# Patient Record
Sex: Female | Born: 1960 | Hispanic: No | Marital: Single | Smoking: Current every day smoker
Health system: Southern US, Community
[De-identification: ages and names within clinical notes are randomized; demographics above are authoritative.]

## PROBLEM LIST (undated history)

## (undated) DIAGNOSIS — F32A Depression, unspecified: Secondary | ICD-10-CM

## (undated) DIAGNOSIS — F329 Major depressive disorder, single episode, unspecified: Secondary | ICD-10-CM

## (undated) DIAGNOSIS — I10 Essential (primary) hypertension: Secondary | ICD-10-CM

## (undated) DIAGNOSIS — K219 Gastro-esophageal reflux disease without esophagitis: Secondary | ICD-10-CM

## (undated) DIAGNOSIS — Z8711 Personal history of peptic ulcer disease: Secondary | ICD-10-CM

## (undated) DIAGNOSIS — Z8669 Personal history of other diseases of the nervous system and sense organs: Secondary | ICD-10-CM

## (undated) DIAGNOSIS — F419 Anxiety disorder, unspecified: Secondary | ICD-10-CM

## (undated) DIAGNOSIS — J449 Chronic obstructive pulmonary disease, unspecified: Secondary | ICD-10-CM

## (undated) DIAGNOSIS — E0520 Thyrotoxicosis with toxic multinodular goiter without thyrotoxic crisis or storm: Secondary | ICD-10-CM

## (undated) DIAGNOSIS — E0590 Thyrotoxicosis, unspecified without thyrotoxic crisis or storm: Secondary | ICD-10-CM

## (undated) DIAGNOSIS — E66812 Obesity, class 2: Secondary | ICD-10-CM

## (undated) DIAGNOSIS — F1010 Alcohol abuse, uncomplicated: Secondary | ICD-10-CM

## (undated) DIAGNOSIS — F1020 Alcohol dependence, uncomplicated: Secondary | ICD-10-CM

## (undated) DIAGNOSIS — F1910 Other psychoactive substance abuse, uncomplicated: Secondary | ICD-10-CM

## (undated) DIAGNOSIS — I5189 Other ill-defined heart diseases: Secondary | ICD-10-CM

## (undated) DIAGNOSIS — Z87442 Personal history of urinary calculi: Secondary | ICD-10-CM

## (undated) DIAGNOSIS — R5383 Other fatigue: Secondary | ICD-10-CM

## (undated) DIAGNOSIS — Z8619 Personal history of other infectious and parasitic diseases: Secondary | ICD-10-CM

## (undated) HISTORY — DX: Depression, unspecified: F32.A

## (undated) HISTORY — DX: Other fatigue: R53.83

## (undated) HISTORY — DX: Essential (primary) hypertension: I10

## (undated) HISTORY — DX: Anxiety disorder, unspecified: F41.9

## (undated) HISTORY — DX: Personal history of other diseases of the nervous system and sense organs: Z86.69

## (undated) HISTORY — PX: URETERAL STENT PLACEMENT: SHX822

## (undated) HISTORY — DX: Alcohol dependence, uncomplicated: F10.20

## (undated) HISTORY — PX: KIDNEY STONE SURGERY: SHX686

## (undated) HISTORY — DX: Major depressive disorder, single episode, unspecified: F32.9

## (undated) HISTORY — PX: VAGINAL HYSTERECTOMY: SUR661

---

## 2004-08-17 ENCOUNTER — Emergency Department: Payer: Self-pay | Admitting: Emergency Medicine

## 2004-12-09 ENCOUNTER — Emergency Department: Payer: Self-pay | Admitting: Emergency Medicine

## 2004-12-15 ENCOUNTER — Observation Stay (HOSPITAL_COMMUNITY): Admission: RE | Admit: 2004-12-15 | Discharge: 2004-12-16 | Payer: Self-pay | Admitting: Gynecology

## 2004-12-25 ENCOUNTER — Emergency Department: Payer: Self-pay | Admitting: Emergency Medicine

## 2009-10-28 ENCOUNTER — Emergency Department: Payer: Self-pay | Admitting: Emergency Medicine

## 2014-06-24 ENCOUNTER — Encounter (INDEPENDENT_AMBULATORY_CARE_PROVIDER_SITE_OTHER): Payer: PRIVATE HEALTH INSURANCE | Admitting: Ophthalmology

## 2014-06-24 DIAGNOSIS — H43813 Vitreous degeneration, bilateral: Secondary | ICD-10-CM

## 2014-06-24 DIAGNOSIS — H4423 Degenerative myopia, bilateral: Secondary | ICD-10-CM

## 2015-01-18 ENCOUNTER — Ambulatory Visit (INDEPENDENT_AMBULATORY_CARE_PROVIDER_SITE_OTHER): Payer: PRIVATE HEALTH INSURANCE | Admitting: Family Medicine

## 2015-01-18 ENCOUNTER — Encounter: Payer: Self-pay | Admitting: Family Medicine

## 2015-01-18 VITALS — BP 120/74 | HR 75 | Resp 16 | Ht 64.0 in | Wt 162.8 lb

## 2015-01-18 DIAGNOSIS — R6889 Other general symptoms and signs: Secondary | ICD-10-CM | POA: Insufficient documentation

## 2015-01-18 DIAGNOSIS — F329 Major depressive disorder, single episode, unspecified: Secondary | ICD-10-CM

## 2015-01-18 DIAGNOSIS — R5383 Other fatigue: Secondary | ICD-10-CM | POA: Insufficient documentation

## 2015-01-18 DIAGNOSIS — N343 Urethral syndrome, unspecified: Secondary | ICD-10-CM | POA: Insufficient documentation

## 2015-01-18 DIAGNOSIS — F419 Anxiety disorder, unspecified: Secondary | ICD-10-CM | POA: Insufficient documentation

## 2015-01-18 DIAGNOSIS — Z1211 Encounter for screening for malignant neoplasm of colon: Secondary | ICD-10-CM | POA: Insufficient documentation

## 2015-01-18 DIAGNOSIS — R51 Headache: Secondary | ICD-10-CM

## 2015-01-18 DIAGNOSIS — R519 Headache, unspecified: Secondary | ICD-10-CM | POA: Insufficient documentation

## 2015-01-18 DIAGNOSIS — F32A Depression, unspecified: Secondary | ICD-10-CM

## 2015-01-18 DIAGNOSIS — Z0000 Encounter for general adult medical examination without abnormal findings: Secondary | ICD-10-CM | POA: Insufficient documentation

## 2015-01-18 MED ORDER — ESCITALOPRAM OXALATE 20 MG PO TABS: 20.0000 mg | ORAL_TABLET | Freq: Every day | ORAL | Status: DC

## 2015-01-18 NOTE — Progress Notes (Signed)
Name: Gina Wells   MRN: [id number]    DOB: [birth year]   Date:01/18/2015       Progress Note  Subjective  Chief Complaint  Chief Complaint  Patient presents with  . Annual Exam    HPI  Here for annual physical.  Has hx. Of depression with poor compliance with follow up.  On Lexapro, 10 mg/d.  It used to help,. But with more fatigue, sleeplessness, hot flashes, agitation, down/blue feelings. PHQ-9 score of 11.   Past Medical History  Diagnosis Date  . Hx of migraine headaches   . Alcoholism     Past Surgical History  Procedure Laterality Date  . Vaginal hysterectomy      Family History  Problem Relation Age of Onset  . Alcohol abuse Mother   . Diabetes Mother   . Alcohol abuse Father   . Diabetes Father   . Cancer Father   . Cancer Paternal Aunt     History   Social History  . Marital Status: Single    Spouse Name: N/A  . Number of Children: N/A  . Years of Education: N/A   Occupational History  . Not on file.   Social History Main Topics  . Smoking status: Current Every Day Smoker -- 0.50 packs/day    Types: Cigarettes  . Smokeless tobacco: Never Used  . Alcohol Use: No  . Drug Use: No  . Sexual Activity: Not on file   Other Topics Concern  . Not on file   Social History Narrative  . No narrative on file     Current outpatient prescriptions:  .  escitalopram (LEXAPRO) 10 MG tablet, Take by mouth., Disp: , Rfl:   No Known Allergies   Review of Systems  Constitutional: Negative.  Negative for fever, chills and weight loss.  HENT: Negative.  Negative for congestion and sore throat.   Eyes: Negative.  Negative for blurred vision and double vision.  Respiratory: Negative.  Negative for cough, sputum production, shortness of breath and wheezing.   Cardiovascular: Negative.  Negative for chest pain, palpitations, orthopnea and leg swelling.  Gastrointestinal: Negative.  Negative for heartburn, nausea, vomiting, abdominal pain, diarrhea and  blood in stool.  Genitourinary: Negative.  Negative for dysuria, frequency and hematuria.  Musculoskeletal: Negative.  Negative for myalgias and joint pain.  Skin: Negative.  Negative for rash.  Neurological: Negative.  Negative for dizziness, tremors, sensory change, focal weakness, weakness and headaches.  Endo/Heme/Allergies: Negative for polydipsia.  Psychiatric/Behavioral: Positive for depression. The patient is nervous/anxious.        PHQ-9 score of 11.      Objective  Filed Vitals:   01/18/15 1507  BP: 120/74  Pulse: 75  Resp: 16  Height:  (1.626 m)  Weight: 162 lb 12.8 oz (73.846 kg)    Physical Exam  Constitutional: She is oriented to person, place, and time and well-developed, well-nourished, and in no distress. No distress.  HENT:  Head: Normocephalic and atraumatic.  Right Ear: External ear normal.  Left Ear: External ear normal.  Nose: Nose normal.  Mouth/Throat: Oropharynx is clear and moist.  Eyes: Conjunctivae and EOM are normal. Pupils are equal, round, and reactive to light. No scleral icterus.  Neck: Normal range of motion. Neck supple. No tracheal deviation present. No thyromegaly present.  Cardiovascular: Normal rate, regular rhythm, normal heart sounds and intact distal pulses.  Exam reveals no gallop and no friction rub.   No murmur heard. Pulmonary/Chest: Effort normal and breath  sounds normal. No respiratory distress. She has no wheezes. She has no rales. She exhibits no tenderness. Right breast exhibits no inverted nipple, no mass, no nipple discharge, no skin change and no tenderness. Left breast exhibits no inverted nipple, no mass, no nipple discharge, no skin change and no tenderness. Breasts are symmetrical.  Abdominal: Soft. Bowel sounds are normal. She exhibits no distension and no mass. There is no tenderness.  Genitourinary: Right adnexa normal and left adnexa normal. No vaginal discharge found.  Normal external genitalia.  Cx. And uterus  absent.  No adnexal masses or tenderness.  Musculoskeletal: Normal range of motion. She exhibits no edema or tenderness.  Lymphadenopathy:    She has no cervical adenopathy.  Neurological: She is alert and oriented to person, place, and time. No cranial nerve deficit. Coordination normal.  Skin: Skin is warm and dry.  Psychiatric:  Affect somewhat flat and depressed.  PHQ-9 score of 11.  Vitals reviewed.        Assessment & Plan  Problem List Items Addressed This Visit      Other   Clinical depression   Relevant Medications   escitalopram (LEXAPRO) 10 MG tablet   Fatigue   Encounter for general adult medical examination without abnormal findings - Primary   Relevant Orders   MM Digital Screening      Meds ordered this encounter  Medications  . escitalopram (LEXAPRO) 10 MG tablet    Sig: Take by mouth.

## 2015-01-19 LAB — CBC WITH DIFFERENTIAL/PLATELET
BASOS: 0 %
Basophils Absolute: 0 10*3/uL (ref 0.0–0.2)
EOS (ABSOLUTE): 0.1 10*3/uL (ref 0.0–0.4)
Eos: 1 %
HEMOGLOBIN: 14.2 g/dL (ref 11.1–15.9)
Hematocrit: 41.8 % (ref 34.0–46.6)
IMMATURE GRANS (ABS): 0 10*3/uL (ref 0.0–0.1)
Immature Granulocytes: 0 %
Lymphocytes Absolute: 3.2 10*3/uL — ABNORMAL HIGH (ref 0.7–3.1)
Lymphs: 28 %
MCH: 28.9 pg (ref 26.6–33.0)
MCHC: 34 g/dL (ref 31.5–35.7)
MCV: 85 fL (ref 79–97)
MONOS ABS: 0.6 10*3/uL (ref 0.1–0.9)
Monocytes: 5 %
Neutrophils Absolute: 7.8 10*3/uL — ABNORMAL HIGH (ref 1.4–7.0)
Neutrophils: 66 %
Platelets: 262 10*3/uL (ref 150–379)
RBC: 4.92 x10E6/uL (ref 3.77–5.28)
RDW: 13.2 % (ref 12.3–15.4)
WBC: 11.8 10*3/uL — ABNORMAL HIGH (ref 3.4–10.8)

## 2015-01-20 ENCOUNTER — Other Ambulatory Visit: Payer: Self-pay

## 2015-01-20 DIAGNOSIS — E0590 Thyrotoxicosis, unspecified without thyrotoxic crisis or storm: Secondary | ICD-10-CM

## 2015-01-20 LAB — COMPREHENSIVE METABOLIC PANEL
A/G RATIO: 1.7 (ref 1.1–2.5)
ALK PHOS: 131 IU/L — AB (ref 39–117)
ALT: 16 IU/L (ref 0–32)
AST: 12 IU/L (ref 0–40)
Albumin: 4 g/dL (ref 3.5–5.5)
BUN/Creatinine Ratio: 23 (ref 9–23)
BUN: 17 mg/dL (ref 6–24)
Bilirubin Total: 0.5 mg/dL (ref 0.0–1.2)
CALCIUM: 9.8 mg/dL (ref 8.7–10.2)
CO2: 24 mmol/L (ref 18–29)
CREATININE: 0.75 mg/dL (ref 0.57–1.00)
Chloride: 106 mmol/L (ref 97–108)
GFR calc Af Amer: 105 mL/min/{1.73_m2} (ref >59)
GFR, EST NON AFRICAN AMERICAN: 91 mL/min/{1.73_m2} (ref >59)
GLUCOSE: 103 mg/dL — AB (ref 65–99)
Globulin, Total: 2.4 g/dL (ref 1.5–4.5)
POTASSIUM: 4.9 mmol/L (ref 3.5–5.2)
Sodium: 142 mmol/L (ref 134–144)
Total Protein: 6.4 g/dL (ref 6.0–8.5)

## 2015-01-20 LAB — LIPID PANEL
CHOLESTEROL TOTAL: 165 mg/dL (ref 100–199)
Chol/HDL Ratio: 3.2 ratio units (ref 0.0–4.4)
HDL: 51 mg/dL (ref >39)
LDL CALC: 102 mg/dL — AB (ref 0–99)
TRIGLYCERIDES: 62 mg/dL (ref 0–149)
VLDL CHOLESTEROL CAL: 12 mg/dL (ref 5–40)

## 2015-01-20 LAB — TSH: TSH: 0.022 u[IU]/mL — AB (ref 0.450–4.500)

## 2015-01-21 ENCOUNTER — Telehealth: Payer: Self-pay

## 2015-01-21 NOTE — Telephone Encounter (Signed)
Patient called after [facility] and she was very upset that she had no idea why this was ordered. I apologized for confussion as I did not go over labs first with her due to getting a sticky note just asking me to order the scan. I did explain that her TSH was low but her Thyroid is high she has Hyperthyroidism. She seemed very concerned and still upset that she was getting call from scheduling and not us. I then found the message that you sent stating lab result and called back.

## 2015-01-21 NOTE — Progress Notes (Signed)
Left message to have patient call me back to go over labs. Jh

## 2015-01-21 NOTE — Telephone Encounter (Signed)
-----   Message from Janeann ForehandJames H Hawkins Jr., MD sent at 01/20/2015  9:01 AM EDT ----- Thyroid is high (low TSH).  Need to order a thyroid nuclear scan.  Her sugar is sl. High at 103 and one liver/bone enzyme is sl. High.  We will repeat both at a later date to follow.  CBC shows a sl. Elevated WBC.  Probably normal, but we will repeat at follow up also.  Her lipids are essentially normal.  Schedule the Thyroid nuclear scan ASAP please.-jh

## 2015-01-22 NOTE — Telephone Encounter (Signed)
Left message/JH 

## 2015-01-28 ENCOUNTER — Ambulatory Visit: Payer: Self-pay

## 2015-01-28 ENCOUNTER — Encounter
Admission: RE | Admit: 2015-01-28 | Discharge: 2015-01-28 | Disposition: A | Discharge status: Home / Self Care | Payer: No Typology Code available for payment source | Source: Ambulatory Visit | Attending: Family Medicine | Admitting: Family Medicine

## 2015-01-28 DIAGNOSIS — E042 Nontoxic multinodular goiter: Secondary | ICD-10-CM | POA: Insufficient documentation

## 2015-01-28 DIAGNOSIS — E0590 Thyrotoxicosis, unspecified without thyrotoxic crisis or storm: Secondary | ICD-10-CM

## 2015-01-28 MED ORDER — SODIUM IODIDE I-123 7.4 MBQ PO CAPS
159.6000 | ORAL_CAPSULE | Freq: Once | ORAL | Status: AC
  Administered 2015-01-28: 159.6 via ORAL

## 2015-01-29 ENCOUNTER — Encounter
Admission: RE | Admit: 2015-01-29 | Discharge: 2015-01-29 | Disposition: A | Discharge status: Home / Self Care | Payer: No Typology Code available for payment source | Source: Ambulatory Visit | Attending: Family Medicine | Admitting: Family Medicine

## 2015-01-29 DIAGNOSIS — E0590 Thyrotoxicosis, unspecified without thyrotoxic crisis or storm: Secondary | ICD-10-CM | POA: Diagnosis present

## 2015-01-29 DIAGNOSIS — E042 Nontoxic multinodular goiter: Secondary | ICD-10-CM | POA: Diagnosis not present

## 2015-02-01 NOTE — Progress Notes (Signed)
LMTCB

## 2015-02-01 NOTE — Progress Notes (Signed)
[facility]

## 2015-02-02 ENCOUNTER — Telehealth: Payer: Self-pay | Admitting: Family Medicine

## 2015-02-02 NOTE — Telephone Encounter (Signed)
I talked with patent by phone and explained her situation.  [facility]jh

## 2015-02-02 NOTE — Telephone Encounter (Signed)
[facility]. She has many concerns with this Thyroid Nodule and I feel maybe you should find time to call and explain this better than I am. Not sure if this is something that we need to have her seen sooner but there is no local End[facility] around anymore. [facility]

## 2015-02-02 NOTE — Telephone Encounter (Signed)
Pt returned your call.  Please call [phone]

## 2015-02-22 ENCOUNTER — Telehealth: Payer: Self-pay | Admitting: Family Medicine

## 2015-02-22 NOTE — Telephone Encounter (Signed)
Pt  Have requested someone to call  Her  Back about  Appt.this week at [facility]  [phone].

## 2015-02-22 NOTE — Telephone Encounter (Signed)
Done

## 2015-02-25 ENCOUNTER — Other Ambulatory Visit: Payer: Self-pay | Admitting: Internal Medicine

## 2015-02-25 ENCOUNTER — Telehealth: Payer: Self-pay

## 2015-02-25 DIAGNOSIS — E0590 Thyrotoxicosis, unspecified without thyrotoxic crisis or storm: Secondary | ICD-10-CM

## 2015-02-25 NOTE — Telephone Encounter (Signed)
She was just seen there today.  No note from [facility] in chart yet that I  Can find.   If radioactive iodine treatment recommended then they do not feel like there is cancer involved.  But again, I do not have any other info from [facility] yet.  Gina Wells, if you can find anything, show it to me tomorrow.  So far as I can tell, they do not think that there is cancer involved, but she should have a follow up visit scheduled soon, and the Specilaist can answer these questions for her.-jh

## 2015-02-25 NOTE — Telephone Encounter (Signed)
Explained to patient that this is something they usually do to help slow Thyroid activity down and went over Thyroid issues. I told her to wait to hear from [facility] and that they will go over this last scan but if biopsy has not been scheduled then they do not feel this is a cancer. She feels much better and has the radioactive iodine procedure 03/04/2015. She will call if what we said is not the case after speaking to them.[facility]

## 2015-02-25 NOTE — Telephone Encounter (Signed)
Patient states she saw [facility] for Nodules after we sent her there. They did no Biopsy at all. They did another Ultrasound yesterday and the tech told her she saw 7 nodules. Patient very scared and no one will answer her. They did tell her she will be doing Radioactive Iodine Treatment and for 2 days will need to stay away from family and any people. She feel sunsure about all this and would like to know how to find out if these nodules are cancer. The radioactive will begin in a few weeks.

## 2015-02-26 NOTE — Telephone Encounter (Signed)
Ok-jh 

## 2015-03-04 ENCOUNTER — Telehealth: Payer: Self-pay | Admitting: Family Medicine

## 2015-03-04 ENCOUNTER — Ambulatory Visit
Admission: RE | Admit: 2015-03-04 | Discharge: 2015-03-04 | Disposition: A | Discharge status: Home / Self Care | Payer: No Typology Code available for payment source | Source: Ambulatory Visit | Attending: Internal Medicine | Admitting: Internal Medicine

## 2015-03-04 DIAGNOSIS — E0520 Thyrotoxicosis with toxic multinodular goiter without thyrotoxic crisis or storm: Secondary | ICD-10-CM | POA: Insufficient documentation

## 2015-03-04 DIAGNOSIS — E0590 Thyrotoxicosis, unspecified without thyrotoxic crisis or storm: Secondary | ICD-10-CM

## 2015-03-04 HISTORY — DX: Thyrotoxicosis with toxic multinodular goiter without thyrotoxic crisis or storm: E05.20

## 2015-03-04 HISTORY — DX: Thyrotoxicosis, unspecified without thyrotoxic crisis or storm: E05.90

## 2015-03-04 MED ORDER — SODIUM IODIDE I 131 CAPSULE
30.7340 | Freq: Once | INTRAVENOUS | Status: AC | PRN
  Administered 2015-03-04: 30.734 via ORAL

## 2015-03-04 NOTE — Telephone Encounter (Signed)
Pt asked for a phone call.  She had her thyroid radiation at [facility] today.  Pleases call [phone]

## 2015-03-04 NOTE — Telephone Encounter (Signed)
Patient called to see if we had gotten any report on radiation done today. I explained it all takes time and it will go to [place] first. She will call again tomorrow. [facility]

## 2015-03-11 DIAGNOSIS — N390 Urinary tract infection, site not specified: Secondary | ICD-10-CM | POA: Insufficient documentation

## 2015-03-11 DIAGNOSIS — R1013 Epigastric pain: Secondary | ICD-10-CM | POA: Diagnosis not present

## 2015-03-11 DIAGNOSIS — Z720 Tobacco use: Secondary | ICD-10-CM | POA: Diagnosis not present

## 2015-03-11 DIAGNOSIS — Z79899 Other long term (current) drug therapy: Secondary | ICD-10-CM | POA: Insufficient documentation

## 2015-03-11 DIAGNOSIS — R1011 Right upper quadrant pain: Secondary | ICD-10-CM | POA: Insufficient documentation

## 2015-03-11 DIAGNOSIS — G8929 Other chronic pain: Secondary | ICD-10-CM | POA: Diagnosis not present

## 2015-03-12 ENCOUNTER — Emergency Department: Payer: No Typology Code available for payment source

## 2015-03-12 ENCOUNTER — Ambulatory Visit: Payer: PRIVATE HEALTH INSURANCE | Admitting: Family Medicine

## 2015-03-12 ENCOUNTER — Encounter: Payer: Self-pay | Admitting: Occupational Medicine

## 2015-03-12 ENCOUNTER — Emergency Department
Admission: EM | Admit: 2015-03-12 | Discharge: 2015-03-12 | Disposition: A | Discharge status: Home / Self Care | Payer: No Typology Code available for payment source | Attending: Emergency Medicine | Admitting: Emergency Medicine

## 2015-03-12 DIAGNOSIS — R109 Unspecified abdominal pain: Secondary | ICD-10-CM

## 2015-03-12 DIAGNOSIS — R1011 Right upper quadrant pain: Secondary | ICD-10-CM | POA: Diagnosis not present

## 2015-03-12 DIAGNOSIS — N390 Urinary tract infection, site not specified: Secondary | ICD-10-CM

## 2015-03-12 LAB — URINALYSIS COMPLETE WITH MICROSCOPIC (ARMC ONLY)
Bilirubin Urine: NEGATIVE
Glucose, UA: NEGATIVE mg/dL
Hgb urine dipstick: NEGATIVE
KETONES UR: NEGATIVE mg/dL
NITRITE: NEGATIVE
PH: 6 (ref 5.0–8.0)
PROTEIN: NEGATIVE mg/dL
SPECIFIC GRAVITY, URINE: 1.023 (ref 1.005–1.030)

## 2015-03-12 LAB — COMPREHENSIVE METABOLIC PANEL
ALK PHOS: 126 U/L (ref 38–126)
ALT: 29 U/L (ref 14–54)
ANION GAP: 5 (ref 5–15)
AST: 17 U/L (ref 15–41)
Albumin: 3.9 g/dL (ref 3.5–5.0)
BILIRUBIN TOTAL: 0.6 mg/dL (ref 0.3–1.2)
BUN: 20 mg/dL (ref 6–20)
CO2: 32 mmol/L (ref 22–32)
Calcium: 9.9 mg/dL (ref 8.9–10.3)
Chloride: 103 mmol/L (ref 101–111)
Creatinine, Ser: 0.76 mg/dL (ref 0.44–1.00)
GFR calc Af Amer: >60 mL/min (ref >60)
Glucose, Bld: 96 mg/dL (ref 65–99)
POTASSIUM: 4.3 mmol/L (ref 3.5–5.1)
SODIUM: 140 mmol/L (ref 135–145)
TOTAL PROTEIN: 7 g/dL (ref 6.5–8.1)

## 2015-03-12 LAB — CBC
HEMATOCRIT: 41.7 % (ref 35.0–47.0)
HEMOGLOBIN: 13.5 g/dL (ref 12.0–16.0)
MCH: 28.6 pg (ref 26.0–34.0)
MCHC: 32.4 g/dL (ref 32.0–36.0)
MCV: 88.4 fL (ref 80.0–100.0)
Platelets: 214 10*3/uL (ref 150–440)
RBC: 4.71 MIL/uL (ref 3.80–5.20)
RDW: 13.7 % (ref 11.5–14.5)
WBC: 10.1 10*3/uL (ref 3.6–11.0)

## 2015-03-12 LAB — LIPASE, BLOOD: LIPASE: 29 U/L (ref 22–51)

## 2015-03-12 LAB — TROPONIN I: Troponin I: <0.03 ng/mL (ref <0.031)

## 2015-03-12 MED ORDER — ONDANSETRON HCL 4 MG/2ML IJ SOLN
4.0000 mg | Freq: Once | INTRAMUSCULAR | Status: AC
  Administered 2015-03-12: 4 mg via INTRAVENOUS
  Filled 2015-03-12: qty 2

## 2015-03-12 MED ORDER — SUCRALFATE 1 G PO TABS: 1.0000 g | ORAL_TABLET | Freq: Four times a day (QID) | ORAL | Status: DC

## 2015-03-12 MED ORDER — CEPHALEXIN 500 MG PO CAPS: 500.0000 mg | ORAL_CAPSULE | Freq: Three times a day (TID) | ORAL | Status: AC

## 2015-03-12 MED ORDER — SODIUM CHLORIDE 0.9 % IV BOLUS (SEPSIS)
1000.0000 mL | Freq: Once | INTRAVENOUS | Status: AC
  Administered 2015-03-12: 1000 mL via INTRAVENOUS

## 2015-03-12 MED ORDER — MORPHINE SULFATE (PF) 4 MG/ML IV SOLN
4.0000 mg | Freq: Once | INTRAVENOUS | Status: AC
  Administered 2015-03-12: 4 mg via INTRAVENOUS
  Filled 2015-03-12: qty 1

## 2015-03-12 MED ORDER — DICYCLOMINE HCL 20 MG PO TABS: 20.0000 mg | ORAL_TABLET | Freq: Three times a day (TID) | ORAL | Status: DC | PRN

## 2015-03-12 MED ORDER — OMEPRAZOLE 40 MG PO CPDR: 40.0000 mg | DELAYED_RELEASE_CAPSULE | Freq: Every day | ORAL | Status: DC

## 2015-03-12 MED ORDER — CEPHALEXIN 500 MG PO CAPS
500.0000 mg | ORAL_CAPSULE | Freq: Once | ORAL | Status: AC
  Administered 2015-03-12: 500 mg via ORAL
  Filled 2015-03-12: qty 1

## 2015-03-12 NOTE — ED Notes (Addendum)
Pt presents to ER with abd pain all over having issues for three years pain is twisting feeling. Pt started having issues with abd pain for the last couple days continue to get worse no relief. Pt has c/o nausea bloating hiccups stool is black however takes OTC meds no relief. Pt not eating or drinking because it makes the pain worse.

## 2015-03-12 NOTE — ED Notes (Signed)
Patient transported to Ultrasound 

## 2015-03-12 NOTE — ED Notes (Signed)
Pt will be moved to Room 4 for her to rest due to narcotic administration. Will re-assess after 4 hours.

## 2015-03-12 NOTE — ED Notes (Signed)
Patient with no complaints at this time. Respirations even and unlabored. Skin warm/dry. Discharge instructions reviewed with patient at this time. Patient given opportunity to voice concerns/ask questions. IV removed per policy and band-aid applied to site. Patient discharged at this time and left Emergency Department with steady gait.  

## 2015-03-12 NOTE — ED Notes (Signed)
Patient back from Ultrasound.

## 2015-03-12 NOTE — Discharge Instructions (Signed)

## 2015-03-12 NOTE — ED Provider Notes (Signed)
[facility] Emergency Department Provider Note  ____________________________________________  Time seen: Approximately 210 AM  I have reviewed the triage vital signs and the nursing notes.   HISTORY  Chief Complaint Abdominal Pain    HPI Gina Wells is a 54 y.o. female with a history of abdominal pain over the past 3 years but worsening over the past week. She says that she has upper abdominal cramping which is worse than right upper quadrant. It is associated with nausea. She says she is also had black stools over the past 3 days. She does not take any iron supplementation. She says the pain is worse with eating and drinking and is a cramping which can last 5 minutes or several hours. She has not had her gallbladder removed.Says that she has been on antacids in the past which were helping this sort of pain but they stopped working.   Past Medical History  Diagnosis Date  . Hx of migraine headaches   . Alcoholism   . Hyperthyroidism   . Toxic multinodular goiter w/o crisis     Patient Active Problem List   Diagnosis Date Noted  . 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 Clinical depression 01/18/2015  . Dysuria-frequency syndrome 01/18/2015  . Fatigue 01/18/2015  . Encounter for general adult medical examination without abnormal findings 01/18/2015  . Cephalalgia 01/18/2015  . Mechanical and motor problems with internal organs 01/18/2015    Past Surgical History  Procedure Laterality Date  . Vaginal hysterectomy    . Ureteral stent placement      2002 for kidney stones    Current Outpatient Rx  Name  Route  Sig  Dispense  Refill  . acetaminophen (RA ACETAMINOPHEN) 650 MG CR tablet   Oral   Take 2 tablets by mouth 2 (two) times daily.         Marland Kitchen escitalopram (LEXAPRO) 20 MG tablet   Oral   Take 1 tablet (20 mg total) by mouth daily.   30 tablet   3   . omeprazole (PRILOSEC) 20 MG capsule   Oral   Take 1 capsule by mouth  daily.           Allergies Review of patient's allergies indicates no known allergies.  Family History  Problem Relation Age of Onset  . Alcohol abuse Mother   . Diabetes Mother   . Alcohol abuse Father   . Diabetes Father   . Cancer Father   . Cancer Paternal Aunt     Social History Social History  Substance Use Topics  . Smoking status: Current Every Day Smoker -- 0.50 packs/day    Types: Cigarettes  . Smokeless tobacco: Never Used  . Alcohol Use: No    Review of Systems Constitutional: No fever/chills Eyes: No visual changes. ENT: No sore throat. Cardiovascular: Denies chest pain. Respiratory: Denies shortness of breath. Gastrointestinal:  no vomiting.  No diarrhea.  No constipation. Genitourinary: Negative for dysuria. Musculoskeletal: Negative for back pain. Skin: Negative for rash. Neurological: Negative for headaches, focal weakness or numbness.  10-point ROS otherwise negative.  ____________________________________________   PHYSICAL EXAM:  VITAL SIGNS: ED Triage Vitals  Enc Vitals Group     BP 03/12/15 0011 132/84 mmHg     Pulse Rate 03/12/15 0011 65     Resp --      Temp 03/12/15 0011 98.3 F (36.8 C)     Temp Source 03/12/15 0011 Oral     SpO2 03/12/15 0011 99 %  Weight 03/12/15 0020 166 lb (75.297 kg)     Height 03/12/15 0020 5\' 4"  (1.626 m)     Head Cir --      Peak Flow --      Pain Score 03/12/15 0131 8     Pain Loc --      Pain Edu? --      Excl. in GC? --     Constitutional: Alert and oriented. Well appearing and in no acute distress. Eyes: Conjunctivae are normal. PERRL. EOMI. Head: Atraumatic. Nose: No congestion/rhinnorhea. Mouth/Throat: Mucous membranes are moist.  Oropharynx non-erythematous. Neck: No stridor.   Cardiovascular: Normal rate, regular rhythm. Grossly normal heart sounds.  Good peripheral circulation. Respiratory: Normal respiratory effort.  No retractions. Lungs CTAB. Gastrointestinal: Soft with  tenderness in the epigastrium and right upper quadrant. There is no rebound or guarding. There is a negative Murphy sign. No distention. No abdominal bruits. No CVA tenderness. No tenderness over the lower abdomen. Rectal exam with brown stool which is heme-negative. Musculoskeletal: No lower extremity tenderness nor edema.  No joint effusions. Neurologic:  Normal speech and language. No gross focal neurologic deficits are appreciated. No gait instability. Skin:  Skin is warm, dry and intact. No rash noted. Psychiatric: Mood and affect are normal. Speech and behavior are normal.  ____________________________________________   LABS (all labs ordered are listed, but only abnormal results are displayed)  Labs Reviewed  URINALYSIS COMPLETEWITH MICROSCOPIC ([facility] ONLY) - Abnormal; Notable for the following:    Color, Urine YELLOW (*)    APPearance CLOUDY (*)    Leukocytes, UA 3+ (*)    Bacteria, UA FEW (*)    Squamous Epithelial / LPF 6-30 (*)    All other components within normal limits  LIPASE, BLOOD  COMPREHENSIVE METABOLIC PANEL  CBC  TROPONIN I   ____________________________________________  EKG  ED ECG REPORT I, Arelia Longest, the attending physician, personally viewed and interpreted this ECG.   Date: 03/12/2015  EKG Time: 228  Rate: 66  Rhythm: normal sinus rhythm  Axis: Normal axis  Intervals:none  ST&T Change: No ST elevations or depressions. No abnormal T-wave inversions.  ____________________________________________  RADIOLOGY  No acute disease on the chest x-ray. I personally reviewed this image. Normal right upper quadrant or sound. ____________________________________________   PROCEDURES    ____________________________________________   INITIAL IMPRESSION / ASSESSMENT AND PLAN / ED COURSE  Pertinent labs & imaging results that were available during my care of the patient were reviewed by me and considered in my medical decision making (see  chart for details).  ----------------------------------------- 4:00 AM on 03/12/2015 -----------------------------------------  Patient is resting comfortably at this time. Possibly gastritis. We'll discharge with multiple medicines for abdominal pain including gastritis and acid reflux. We'll also treat the urinary tract infection. We'll discharge to home. Patient to follow up with primary care doctor. We'll also give number for gastrointestinal follow-up. Counseled the patient that she will likely need an endoscopy as this seems to be a chronic issue ongoing for 3 years. ____________________________________________   FINAL CLINICAL IMPRESSION(S) / ED DIAGNOSES  Acute on chronic abdominal pain. Acute urinary tract infection. Initial visit.    Myrna Blazer, MD 03/12/15 [phone]

## 2015-03-12 NOTE — ED Notes (Signed)
Pt states she feels better and denies pain at this time. MD made aware. Pt left ER with steady gait and alert and oriented x4.

## 2015-03-14 LAB — URINE CULTURE

## 2015-05-12 ENCOUNTER — Ambulatory Visit (INDEPENDENT_AMBULATORY_CARE_PROVIDER_SITE_OTHER): Payer: PRIVATE HEALTH INSURANCE | Admitting: Family Medicine

## 2015-05-12 ENCOUNTER — Encounter: Payer: Self-pay | Admitting: Family Medicine

## 2015-05-12 VITALS — BP 130/83 | HR 66 | Temp 98.0°F | Resp 16 | Ht 65.0 in | Wt 168.4 lb

## 2015-05-12 DIAGNOSIS — E0520 Thyrotoxicosis with toxic multinodular goiter without thyrotoxic crisis or storm: Secondary | ICD-10-CM

## 2015-05-12 DIAGNOSIS — F32A Depression, unspecified: Secondary | ICD-10-CM

## 2015-05-12 DIAGNOSIS — F329 Major depressive disorder, single episode, unspecified: Secondary | ICD-10-CM | POA: Diagnosis not present

## 2015-05-12 DIAGNOSIS — K219 Gastro-esophageal reflux disease without esophagitis: Secondary | ICD-10-CM | POA: Diagnosis not present

## 2015-05-12 MED ORDER — ESCITALOPRAM OXALATE 20 MG PO TABS: 20.0000 mg | ORAL_TABLET | Freq: Every day | ORAL | Status: DC

## 2015-05-12 MED ORDER — OMEPRAZOLE 40 MG PO CPDR: 40.0000 mg | DELAYED_RELEASE_CAPSULE | Freq: Every day | ORAL | Status: DC

## 2015-05-12 NOTE — Progress Notes (Signed)
Name: Gina Wells   MRN: [id number]    DOB: [birth year]   Date:05/12/2015       Progress Note  Subjective  Chief Complaint  Chief Complaint  Patient presents with  . Hypothyroidism    follow up     HPI For f/u of hypothyroidism sec. to multinodular goiter.   Just started Levothyroxine 1 week ago.  No problems.  Is doing well with depression.  Also GERD is doing well on Prilosec.    No problem-specific assessment & plan notes found for this encounter.   Past Medical History  Diagnosis Date  . Hx of migraine headaches   . Alcoholism ([place])   . Hyperthyroidism   . 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 Depression   . Fatigue     Social History  Substance Use Topics  . Smoking status: Current Every Day Smoker -- 0.50 packs/day    Types: Cigarettes  . Smokeless tobacco: Never Used  . Alcohol Use: No     Current outpatient prescriptions:  .  escitalopram (LEXAPRO) 20 MG tablet, Take 1 tablet (20 mg total) by mouth daily., Disp: 30 tablet, Rfl: 3 .  levothyroxine (SYNTHROID, LEVOTHROID) 25 MCG tablet, Take by mouth., Disp: , Rfl:  .  omeprazole (PRILOSEC) 40 MG capsule, Take 1 capsule (40 mg total) by mouth daily., Disp: 30 capsule, Rfl: 1  No Known Allergies  Review of Systems  Constitutional: Positive for malaise/fatigue. Negative for fever, chills and weight loss.  HENT: Negative for hearing loss.   Eyes: Negative for blurred vision and double vision.  Respiratory: Negative for cough, shortness of breath and wheezing.   Cardiovascular: Negative for chest pain, palpitations and leg swelling.  Gastrointestinal: Negative for heartburn, abdominal pain and blood in stool.  Genitourinary: Negative for dysuria, urgency and frequency.  Musculoskeletal: Negative for myalgias and joint pain.  Neurological: Positive for weakness. Negative for dizziness, tremors and headaches.  Endo/Heme/Allergies: Negative for polydipsia.      Objective  Filed Vitals:   05/12/15 1546  BP: 130/83  Pulse: 66  Temp: 98 F (36.7 C)  TempSrc: Oral  Resp: 16  Height: 5' 5"  (1.651 m)  Weight: 168 lb 6.4 oz (76.386 kg)     Physical Exam  Constitutional: She is well-developed, well-nourished, and in no distress. No distress.  HENT:  Head: Normocephalic and atraumatic.  Eyes: Conjunctivae and EOM are normal. Pupils are equal, round, and reactive to light. No scleral icterus.  Neck: Normal range of motion. Neck supple. Thyromegaly (with bilateral multiple thyroid cysts) present.  Cardiovascular: Normal rate, regular rhythm, normal heart sounds and intact distal pulses.  Exam reveals no gallop and no friction rub.   No murmur heard. Pulmonary/Chest: Effort normal and breath sounds normal. No respiratory distress. She has no wheezes. She has no rales.  Abdominal: Soft. Bowel sounds are normal. She exhibits no distension and no mass. There is no tenderness.  Musculoskeletal: She exhibits no edema.  Lymphadenopathy:    She has no cervical adenopathy.  Psychiatric:  Affect good.  She feels 6/10 at present.  Vitals reviewed.     Recent Results (from the past 2160 hour(s))  Lipase, blood     Status: None   Collection Time: 03/12/15 12:26 AM  Result Value Ref Range   Lipase 29 22 - 51 U/L  Comprehensive metabolic panel     Status: None   Collection Time: 03/12/15 12:26 AM  Result Value Ref Range   Sodium 140 135 -  145 mmol/L   Potassium 4.3 3.5 - 5.1 mmol/L   Chloride 103 101 - 111 mmol/L   CO2 32 22 - 32 mmol/L   Glucose, Bld 96 65 - 99 mg/dL   BUN 20 6 - 20 mg/dL   Creatinine, Ser 0.76 0.44 - 1.00 mg/dL   Calcium 9.9 8.9 - 10.3 mg/dL   Total Protein 7.0 6.5 - 8.1 g/dL   Albumin 3.9 3.5 - 5.0 g/dL   AST 17 15 - 41 U/L   ALT 29 14 - 54 U/L   Alkaline Phosphatase 126 38 - 126 U/L   Total Bilirubin 0.6 0.3 - 1.2 mg/dL   GFR calc non Af Amer >60 >60 mL/min   GFR calc Af Amer >60 >60 mL/min    Comment: (NOTE) The eGFR has been calculated using the  CKD EPI equation. This calculation has not been validated in all clinical situations. eGFR's persistently <60 mL/min signify possible Chronic Kidney Disease.    Anion gap 5 5 - 15  CBC     Status: None   Collection Time: 03/12/15 12:26 AM  Result Value Ref Range   WBC 10.1 3.6 - 11.0 K/uL   RBC 4.71 3.80 - 5.20 MIL/uL   Hemoglobin 13.5 12.0 - 16.0 g/dL   HCT 41.7 35.0 - 47.0 %   MCV 88.4 80.0 - 100.0 fL   MCH 28.6 26.0 - 34.0 pg   MCHC 32.4 32.0 - 36.0 g/dL   RDW 13.7 11.5 - 14.5 %   Platelets 214 150 - 440 K/uL  Urinalysis complete, with microscopic ([facility] only)     Status: Abnormal   Collection Time: 03/12/15 12:26 AM  Result Value Ref Range   Color, Urine YELLOW (A) YELLOW   APPearance CLOUDY (A) CLEAR   Glucose, UA NEGATIVE NEGATIVE mg/dL   Bilirubin Urine NEGATIVE NEGATIVE   Ketones, ur NEGATIVE NEGATIVE mg/dL   Specific Gravity, Urine 1.023 1.005 - 1.030   Hgb urine dipstick NEGATIVE NEGATIVE   pH 6.0 5.0 - 8.0   Protein, ur NEGATIVE NEGATIVE mg/dL   Nitrite NEGATIVE NEGATIVE   Leukocytes, UA 3+ (A) NEGATIVE   RBC / HPF 6-30 0 - 5 RBC/hpf   WBC, UA TOO NUMEROUS TO COUNT 0 - 5 WBC/hpf   Bacteria, UA FEW (A) NONE SEEN   Squamous Epithelial / LPF 6-30 (A) NONE SEEN   Mucous PRESENT    Amorphous Crystal PRESENT   Urine culture     Status: None   Collection Time: 03/12/15 12:26 AM  Result Value Ref Range   Specimen Description URINE, RANDOM    Special Requests NONE    Culture 30,000 COLONIES/mL PROTEUS MIRABILIS    Report Status 03/14/2015 FINAL    Organism ID, Bacteria PROTEUS MIRABILIS       Susceptibility   Proteus mirabilis - MIC*    AMPICILLIN <=2 SENSITIVE Sensitive     CEFTAZIDIME <=1 SENSITIVE Sensitive     CEFAZOLIN <=4 SENSITIVE Sensitive     CEFTRIAXONE <=1 SENSITIVE Sensitive     CIPROFLOXACIN <=0.25 SENSITIVE Sensitive     GENTAMICIN <=1 SENSITIVE Sensitive     IMIPENEM 8 INTERMEDIATE Intermediate     TRIMETH/SULFA <=20 SENSITIVE Sensitive      NITROFURANTOIN Value in next row Resistant      RESISTANT128    PIP/TAZO Value in next row Sensitive      SENSITIVE<=4    LEVOFLOXACIN Value in next row Sensitive      SENSITIVE<=0.12    *  30,000 COLONIES/mL PROTEUS MIRABILIS  Troponin I     Status: None   Collection Time: 03/12/15 12:37 AM  Result Value Ref Range   Troponin I <0.03 <0.031 ng/mL    Comment:        NO INDICATION OF MYOCARDIAL INJURY.      Assessment & Plan  1. Gastroesophageal reflux disease without esophagitis  - omeprazole (PRILOSEC) 40 MG capsule; Take 1 capsule (40 mg total) by mouth daily.  Dispense: 30 capsule; Refill: 12  2. Clinical depression  - escitalopram (LEXAPRO) 20 MG tablet; Take 1 tablet (20 mg total) by mouth daily.  Dispense: 30 tablet; Refill: 6  3. 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

## 2015-05-12 NOTE — Patient Instructions (Signed)
Cont. To follow with Dr. Tedd [facility]iasSolum rfe: thyroid cysts.  She states that shew will get flu shot at work.

## 2015-09-02 ENCOUNTER — Ambulatory Visit (INDEPENDENT_AMBULATORY_CARE_PROVIDER_SITE_OTHER): Payer: PRIVATE HEALTH INSURANCE | Admitting: Family Medicine

## 2015-09-02 ENCOUNTER — Encounter: Payer: Self-pay | Admitting: Family Medicine

## 2015-09-02 VITALS — BP 125/82 | HR 76 | Temp 97.7°F | Resp 18 | Ht 65.0 in | Wt 184.6 lb

## 2015-09-02 DIAGNOSIS — F32A Depression, unspecified: Secondary | ICD-10-CM

## 2015-09-02 DIAGNOSIS — F329 Major depressive disorder, single episode, unspecified: Secondary | ICD-10-CM | POA: Diagnosis not present

## 2015-09-02 DIAGNOSIS — E0520 Thyrotoxicosis with toxic multinodular goiter without thyrotoxic crisis or storm: Secondary | ICD-10-CM | POA: Diagnosis not present

## 2015-09-02 DIAGNOSIS — K219 Gastro-esophageal reflux disease without esophagitis: Secondary | ICD-10-CM | POA: Diagnosis not present

## 2015-09-02 MED ORDER — DULOXETINE HCL 30 MG PO CPEP: 30.0000 mg | ORAL_CAPSULE | Freq: Every day | ORAL | Status: DC

## 2015-09-02 NOTE — Progress Notes (Signed)
Name: Gina Wells   MRN: [id number]    DOB: 1960/11/09   Date:09/02/2015       Progress Note  Subjective  Chief Complaint  Chief Complaint  Patient presents with  . Follow-up  . Depression  . Gastroesophageal Reflux  . Hypothyroidism    HPI  Here for f/u of depression and GERD.  She also has hypothyroidism and is followed by Gina Wells.  TSH is within normal range now.  Ovdrall she is feeling well.   Feels 6 out of 10 overall.  She does not feel sad, but occ. Gets no motivation, irritable, tired.  She soul like sleep to be a little better.  Her stomach is doing well. No problem-specific assessment & plan notes found for this encounter.   Past Medical History  Diagnosis Date  . Hx of migraine headaches   . Alcoholism (HCC)   . Hyperthyroidism   . 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 Depression   . Fatigue     Past Surgical History  Procedure Laterality Date  . Vaginal hysterectomy    . Ureteral stent placement      2002 for kidney stones  . Kidney stone surgery      Family History  Problem Relation Age of Onset  . Alcohol abuse Mother   . Diabetes Mother   . Alcohol abuse Father   . Diabetes Father   . Cancer Father   . Cancer Paternal Aunt     Social History   Social History  . Marital Status: Single    Spouse Name: N/A  . Number of Children: N/A  . Years of Education: N/A   Occupational History  . Not on file.   Social History Main Topics  . Smoking status: Current Every Day Smoker -- 0.50 packs/day    Types: Cigarettes  . Smokeless tobacco: Never Used  . Alcohol Use: No  . Drug Use: No  . Sexual Activity: Not on file   Other Topics Concern  . Not on file   Social History Narrative     Current outpatient prescriptions:  .  levothyroxine (SYNTHROID, LEVOTHROID) 25 MCG tablet, Take 75 mcg by mouth daily before breakfast. , Disp: , Rfl:  .  omeprazole (PRILOSEC) 40 MG capsule, Take 1 capsule (40 mg total) by mouth daily., Disp: 30  capsule, Rfl: 12 .  acetaminophen (RA ACETAMINOPHEN) 650 MG CR tablet, Take by mouth., Disp: , Rfl:  .  DULoxetine (CYMBALTA) 30 MG capsule, Take 1 capsule (30 mg total) by mouth daily., Disp: 30 capsule, Rfl: 6  Not on File   Review of Systems  Constitutional: Positive for malaise/fatigue (occ.). Negative for fever, chills and weight loss.  HENT: Negative for hearing loss.   Eyes: Negative for blurred vision and double vision.  Respiratory: Negative for cough, shortness of breath and wheezing.   Cardiovascular: Negative for chest pain, palpitations and leg swelling.  Gastrointestinal: Negative for heartburn, abdominal pain and blood in stool.  Genitourinary: Negative for dysuria, urgency and frequency.  Skin: Negative for itching and rash.  Neurological: Negative for dizziness, tremors, weakness and headaches.  Psychiatric/Behavioral: Positive for depression. The patient has insomnia.       Objective  Filed Vitals:   09/02/15 1553  BP: 125/82  Pulse: 76  Temp: 97.7 F (36.5 C)  TempSrc: Oral  Resp: 18  Height:  (1.651 m)  Weight: 184 lb 9.6 oz (83.734 kg)    Physical Exam  Constitutional: She is  oriented to person, place, and time and well-developed, well-nourished, and in no distress. No distress.  HENT:  Head: Normocephalic.  Eyes: Conjunctivae and EOM are normal. Pupils are equal, round, and reactive to light. No scleral icterus.  Neck: Normal range of motion. Neck supple. Carotid bruit is not present. No thyromegaly present.  Cardiovascular: Normal rate, regular rhythm and normal heart sounds.  Exam reveals no gallop and no friction rub.   No murmur heard. Pulmonary/Chest: Effort normal and breath sounds normal. No respiratory distress. She exhibits no tenderness.  Abdominal: Soft. Bowel sounds are normal. She exhibits no distension and no mass. There is no tenderness.  Musculoskeletal: Normal range of motion. She exhibits no edema.  Lymphadenopathy:    She  has no cervical adenopathy.  Neurological: She is alert and oriented to person, place, and time.  Vitals reviewed.      No results found for this or any previous visit (from the past 2160 hour(s)).   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 acetaminophen (RA ACETAMINOPHEN) 650 MG CR tablet    Sig: Take by mouth.  . DULoxetine (CYMBALTA) 30 MG capsule    Sig: Take 1 capsule (30 mg total) by mouth daily.    Dispense:  30 capsule    Refill:  6    Start after stopping Lexapro   1. Clinical depression -Stop Lexapro as directed  - DULoxetine (CYMBALTA) 30 MG capsule; Take 1 capsule (30 mg total) by mouth daily.  Dispense: 30 capsule; Refill: 6  2. Gastroesophageal reflux disease without esophagitis Cont. Omeprazole  3. 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

## 2015-11-11 ENCOUNTER — Ambulatory Visit: Payer: PRIVATE HEALTH INSURANCE | Admitting: Family Medicine

## 2015-11-23 ENCOUNTER — Encounter: Payer: Self-pay | Admitting: Family Medicine

## 2015-11-23 ENCOUNTER — Ambulatory Visit (INDEPENDENT_AMBULATORY_CARE_PROVIDER_SITE_OTHER): Payer: PRIVATE HEALTH INSURANCE | Admitting: Family Medicine

## 2015-11-23 VITALS — BP 160/90 | HR 78 | Temp 98.0°F | Resp 16 | Ht 65.0 in | Wt 183.0 lb

## 2015-11-23 DIAGNOSIS — K219 Gastro-esophageal reflux disease without esophagitis: Secondary | ICD-10-CM

## 2015-11-23 DIAGNOSIS — F329 Major depressive disorder, single episode, unspecified: Secondary | ICD-10-CM | POA: Diagnosis not present

## 2015-11-23 DIAGNOSIS — F32A Depression, unspecified: Secondary | ICD-10-CM

## 2015-11-23 DIAGNOSIS — E0520 Thyrotoxicosis with toxic multinodular goiter without thyrotoxic crisis or storm: Secondary | ICD-10-CM | POA: Diagnosis not present

## 2015-11-23 DIAGNOSIS — I10 Essential (primary) hypertension: Secondary | ICD-10-CM

## 2015-11-23 MED ORDER — METOPROLOL TARTRATE 25 MG PO TABS: 25.0000 mg | ORAL_TABLET | Freq: Two times a day (BID) | ORAL | Status: DC

## 2015-11-23 MED ORDER — VENLAFAXINE HCL ER 75 MG PO CP24: 75.0000 mg | ORAL_CAPSULE | Freq: Every day | ORAL | Status: DC

## 2015-11-23 MED ORDER — VENLAFAXINE HCL ER 37.5 MG PO CP24: 37.5000 mg | ORAL_CAPSULE | Freq: Every day | ORAL | Status: DC

## 2015-11-23 NOTE — Progress Notes (Signed)
Name: Gina Wells   MRN: [id number]    DOB: Jun 05, 1961   Date:11/23/2015       Progress Note  Subjective  Chief Complaint  Chief Complaint  Patient presents with  . Gastroesophageal Reflux  . Depression    patient d/c Cymbalta    HPI Here for f/u of Depression.  C/o headaches, diffuse muscle pains, feeling "bad" all over.  She stopped Cymbalta suddenly about 1 week ago and felt much worse.  Feels about 2/10 overall at this time.  No motivation.  Sad.  No crying.  Very irritable.  No problem-specific assessment & plan notes found for this encounter.   Past Medical History  Diagnosis Date  . Hx of migraine headaches   . Alcoholism (HCC)   . Hyperthyroidism   . 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 Depression   . Fatigue     Past Surgical History  Procedure Laterality Date  . Vaginal hysterectomy    . Ureteral stent placement      2002 for kidney stones  . Kidney stone surgery      Family History  Problem Relation Age of Onset  . Alcohol abuse Mother   . Diabetes Mother   . Alcohol abuse Father   . Diabetes Father   . Cancer Father   . Cancer Paternal Aunt     Social History   Social History  . Marital Status: Single    Spouse Name: N/A  . Number of Children: N/A  . Years of Education: N/A   Occupational History  . Not on file.   Social History Main Topics  . Smoking status: Current Every Day Smoker -- 0.50 packs/day    Types: Cigarettes  . Smokeless tobacco: Never Used  . Alcohol Use: No  . Drug Use: No  . Sexual Activity: Not on file   Other Topics Concern  . Not on file   Social History Narrative     Current outpatient prescriptions:  .  levothyroxine (SYNTHROID, LEVOTHROID) 75 MCG tablet, Take 75 mcg by mouth daily., Disp: , Rfl:  .  omeprazole (PRILOSEC) 40 MG capsule, Take 1 capsule (40 mg total) by mouth daily., Disp: 30 capsule, Rfl: 12 .  metoprolol tartrate (LOPRESSOR) 25 MG tablet, Take 1 tablet (25 mg total) by mouth 2  (two) times daily., Disp: 50 tablet, Rfl: 6 .  venlafaxine XR (EFFEXOR XR) 37.5 MG 24 hr capsule, Take 1 capsule (37.5 mg total) by mouth daily with breakfast. Take for 7 days only, Disp: 7 capsule, Rfl: 0 .  venlafaxine XR (EFFEXOR XR) 75 MG 24 hr capsule, Take 1 capsule (75 mg total) by mouth daily with breakfast. Start after finishing 37.5 mg dose., Disp: 30 capsule, Rfl: 6  Not on File   Review of Systems  Constitutional: Positive for malaise/fatigue. Negative for fever, chills and weight loss.  HENT: Negative for hearing loss.   Eyes: Negative for blurred vision and double vision.  Respiratory: Negative for cough, shortness of breath and wheezing.   Cardiovascular: Negative for chest pain, palpitations and leg swelling.  Gastrointestinal: Negative for heartburn, abdominal pain and blood in stool.  Genitourinary: Negative for dysuria, urgency and frequency.  Musculoskeletal: Positive for myalgias.  Skin: Negative for rash.  Neurological: Negative for tremors, weakness and headaches.  Psychiatric/Behavioral: Positive for depression. The patient is nervous/anxious and has insomnia.       Objective  Filed Vitals:   11/23/15 0820 11/23/15 0852  BP: 159/88 160/90  Pulse:  77 78  Temp: 98 F (36.7 C)   TempSrc: Oral   Resp: 16   Height: 5\' 5"  (1.651 m)   Weight: 183 lb (83.008 kg)     Physical Exam  Constitutional: She is oriented to person, place, and time and well-developed, well-nourished, and in no distress. No distress.  HENT:  Head: Normocephalic and atraumatic.  Eyes: Conjunctivae and EOM are normal. Pupils are equal, round, and reactive to light.  Neck: Normal range of motion. Neck supple. Carotid bruit is not present. Thyroid mass (multinodular goiter (mild).) present.  Cardiovascular: Normal rate, regular rhythm and normal heart sounds.  Exam reveals no gallop and no friction rub.   No murmur heard. Pulmonary/Chest: Effort normal and breath sounds normal. No  respiratory distress. She has no wheezes. She has no rales.  Abdominal: Soft. Bowel sounds are normal. She exhibits no distension and no mass. There is no tenderness.  Musculoskeletal: She exhibits no edema.  Lymphadenopathy:    She has cervical adenopathy.  Neurological: She is alert and oriented to person, place, and time.  Psychiatric:  Affect is depressed.  No suicidal ideation.  Feels 2/10 overall.  Vitals reviewed.      No results found for this or any previous visit (from the past 2160 hour(s)).   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 levothyroxine (SYNTHROID, LEVOTHROID) 75 MCG tablet    Sig: Take 75 mcg by mouth daily.  . metoprolol tartrate (LOPRESSOR) 25 MG tablet    Sig: Take 1 tablet (25 mg total) by mouth 2 (two) times daily.    Dispense:  50 tablet    Refill:  6  . venlafaxine XR (EFFEXOR XR) 37.5 MG 24 hr capsule    Sig: Take 1 capsule (37.5 mg total) by mouth daily with breakfast. Take for 7 days only    Dispense:  7 capsule    Refill:  0  . venlafaxine XR (EFFEXOR XR) 75 MG 24 hr capsule    Sig: Take 1 capsule (75 mg total) by mouth daily with breakfast. Start after finishing 37.5 mg dose.    Dispense:  30 capsule    Refill:   6   1. Clinical depression  - Comprehensive Metabolic Panel (CMET) - Lipid Profile - venlafaxine XR (EFFEXOR XR) 37.5 MG 24 hr capsule; Take 1 capsule (37.5 mg total) by mouth daily with breakfast. Take for 7 days only  Dispense: 7 capsule; Refill: 0 - venlafaxine XR (EFFEXOR XR) 75 MG 24 hr capsule; Take 1 capsule (75 mg total) by mouth daily with breakfast. Start after finishing 37.5 mg dose.  Dispense: 30 capsule; Refill: 6  2. Toxic  multinodular goiter w/o crisis  Cont. To see Dr. Tedd Sias 3. Gastroesophageal reflux disease without esophagitis Cont Omeprazole - CBC with Differential  4. Essential hypertension  - metoprolol tartrate (LOPRESSOR) 25 MG tablet; Take 1 tablet (25 mg total) by mouth 2 (two) times daily.  Dispense: 50 tablet; Refill: 6

## 2016-01-12 ENCOUNTER — Ambulatory Visit (INDEPENDENT_AMBULATORY_CARE_PROVIDER_SITE_OTHER): Payer: PRIVATE HEALTH INSURANCE | Admitting: Family Medicine

## 2016-01-12 ENCOUNTER — Telehealth: Payer: Self-pay | Admitting: Family Medicine

## 2016-01-12 ENCOUNTER — Encounter: Payer: Self-pay | Admitting: Family Medicine

## 2016-01-12 VITALS — BP 153/89 | HR 82 | Temp 98.1°F | Resp 16 | Ht 65.0 in | Wt 184.0 lb

## 2016-01-12 DIAGNOSIS — I10 Essential (primary) hypertension: Secondary | ICD-10-CM | POA: Diagnosis not present

## 2016-01-12 DIAGNOSIS — F32A Depression, unspecified: Secondary | ICD-10-CM

## 2016-01-12 DIAGNOSIS — F329 Major depressive disorder, single episode, unspecified: Secondary | ICD-10-CM

## 2016-01-12 MED ORDER — VENLAFAXINE HCL ER 37.5 MG PO CP24: 37.5000 mg | ORAL_CAPSULE | Freq: Every day | ORAL | Status: DC

## 2016-01-12 NOTE — Telephone Encounter (Signed)
Pt said Gina Wells will not see her because she has insurance.  It would cost her $231 out of pocket to be seen there.  Her call back number is [phone]

## 2016-01-12 NOTE — Progress Notes (Signed)
Name: Gina Wells   MRN: [id number]    DOB: 1960-10-13   Date:01/12/2016       Progress Note  Subjective  Chief Complaint  Chief Complaint  Patient presents with  . Depression    not improving Sx,  sucidal thought, still crying friends at work helps    HPI Here for f/u of depression.  She has been on Effexor for about 1 month and feels worse.   More crying and feelings of loss and hopelessness.  Started having some suicidal thoughts now also.  She has made no suicidal plans.    Also she has started to itch and scratch since starting thew Effexor.  No problem-specific assessment & plan notes found for this encounter.   Past Medical History  Diagnosis Date  . Hx of migraine headaches   . Alcoholism (HCC)   . Hyperthyroidism   . 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 Depression   . Fatigue     Past Surgical History  Procedure Laterality Date  . Vaginal hysterectomy    . Ureteral stent placement      2002 for kidney stones  . Kidney stone surgery      Family History  Problem Relation Age of Onset  . Alcohol abuse Mother   . Diabetes Mother   . Alcohol abuse Father   . Diabetes Father   . Cancer Father   . Cancer Paternal Aunt     Social History   Social History  . Marital Status: Single    Spouse Name: N/A  . Number of Children: N/A  . Years of Education: N/A   Occupational History  . Not on file.   Social History Main Topics  . Smoking status: Current Every Day Smoker -- 0.50 packs/day    Types: Cigarettes  . Smokeless tobacco: Never Used  . Alcohol Use: No  . Drug Use: No  . Sexual Activity: Not on file   Other Topics Concern  . Not on file   Social History Narrative     Current outpatient prescriptions:  .  levothyroxine (SYNTHROID, LEVOTHROID) 75 MCG tablet, Take 75 mcg by mouth daily., Disp: , Rfl:  .  metoprolol tartrate (LOPRESSOR) 25 MG tablet, Take 1 tablet (25 mg total) by mouth 2 (two) times daily., Disp: 50 tablet, Rfl: 6 .   omeprazole (PRILOSEC) 40 MG capsule, Take 1 capsule (40 mg total) by mouth daily., Disp: 30 capsule, Rfl: 12 .  venlafaxine XR (EFFEXOR XR) 37.5 MG 24 hr capsule, Take 1 capsule (37.5 mg total) by mouth daily with breakfast. Take for 7 days only, Disp: 7 capsule, Rfl: 0  Not on File   Review of Systems  Constitutional: Positive for malaise/fatigue. Negative for fever, chills, weight loss and diaphoresis.  HENT: Negative for hearing loss.   Eyes: Negative for blurred vision and double vision.  Respiratory: Positive for shortness of breath and wheezing. Negative for cough.   Cardiovascular: Negative for chest pain, palpitations and leg swelling.  Gastrointestinal: Negative for heartburn, abdominal pain and blood in stool.  Genitourinary: Negative for dysuria, urgency and frequency.  Skin: Negative for rash.  Neurological: Negative for dizziness, tremors, weakness and headaches.  Psychiatric/Behavioral: Positive for depression. The patient is nervous/anxious and has insomnia.       Objective  Filed Vitals:   01/12/16 0843  BP: 153/89  Pulse: 82  Temp: 98.1 F (36.7 C)  TempSrc: Oral  Resp: 16  Height:  (1.651 m)  Weight:  184 lb (83.462 kg)    Physical Exam  Constitutional: She is oriented to person, place, and time. She appears distressed (anxious and depressed.).  HENT:  Head: Normocephalic and atraumatic.  Eyes: Conjunctivae and EOM are normal. Pupils are equal, round, and reactive to light. No scleral icterus.  Neck: Normal range of motion. Neck supple. Carotid bruit is not present. No thyromegaly present.  Cardiovascular: Normal rate, regular rhythm and normal heart sounds.  Exam reveals no gallop and no friction rub.   No murmur heard. Pulmonary/Chest: Effort normal and breath sounds normal. No respiratory distress. She has no wheezes. She has no rales.  Musculoskeletal: She exhibits no edema.  Lymphadenopathy:    She has no cervical adenopathy.  Neurological:  She is alert and oriented to person, place, and time.  Psychiatric:  Patient with dep(ressed affect.  Crying easily.  PHQ-9 score of 20.   she wan ts to feel normal, but doesn't know what normal is She has made no suicidal plans at this time  Vitals reviewed.      No results found for this or any previous visit (from the past 2160 hour(s)).   Assessment & Plan  Problem List Items Addressed This Visit      Other   Clinical depression - Primary   Relevant Medications   venlafaxine XR (EFFEXOR XR) 37.5 MG 24 hr capsule   Other Relevant Orders   Ambulatory referral to Psychiatry      Meds ordered this encounter  Medications  . venlafaxine XR (EFFEXOR XR) 37.5 MG 24 hr capsule    Sig: Take 1 capsule (37.5 mg total) by mouth daily with breakfast. Take for 7 days only    Dispense:  7 capsule    Refill:  0   1. Clinical depression  - venlafaxine XR (EFFEXOR XR) 37.5 MG 24 hr capsule; Take 1 capsule (37.5 mg total) by mouth daily with breakfast. Take for 7 days only  Dispense: 7 capsule; Refill: 0 - Ambulatory referral to Psych[facility]ry[facility] today.  Info given  2. Essential hypertension Cont meds RTC-1 month

## 2016-01-12 NOTE — Telephone Encounter (Signed)
Referral redirected to [facility].

## 2016-01-12 NOTE — Patient Instructions (Signed)
Contact [facility] today.

## 2016-01-13 ENCOUNTER — Ambulatory Visit: Payer: PRIVATE HEALTH INSURANCE | Admitting: Family Medicine

## 2016-01-17 ENCOUNTER — Telehealth: Payer: Self-pay | Admitting: Family Medicine

## 2016-01-17 NOTE — Telephone Encounter (Signed)
Pt has appt scheduled to see psychiatrist on Aug 1st.  She was prescribed venlafaxine 37.5 mg but was only given a 7 day supply and asked for refills enough to last her until that appt.  Her call back number is [phone]

## 2016-01-17 NOTE — Telephone Encounter (Signed)
[facility]Marland Kitchen

## 2016-01-17 NOTE — Telephone Encounter (Signed)
See if she can be seen sooner.  I do not want to continue the Effexor.-jh

## 2016-01-19 ENCOUNTER — Ambulatory Visit (INDEPENDENT_AMBULATORY_CARE_PROVIDER_SITE_OTHER): Payer: PRIVATE HEALTH INSURANCE | Admitting: Psychiatry

## 2016-01-19 ENCOUNTER — Encounter: Payer: Self-pay | Admitting: Psychiatry

## 2016-01-19 VITALS — BP 120/90 | HR 88 | Temp 98.7°F | Ht 65.0 in | Wt 184.0 lb

## 2016-01-19 DIAGNOSIS — F3160 Bipolar disorder, current episode mixed, unspecified: Secondary | ICD-10-CM

## 2016-01-19 DIAGNOSIS — F411 Generalized anxiety disorder: Secondary | ICD-10-CM | POA: Diagnosis not present

## 2016-01-19 MED ORDER — ESCITALOPRAM OXALATE 10 MG PO TABS: 10.0000 mg | ORAL_TABLET | Freq: Every day | ORAL | Status: DC

## 2016-01-19 MED ORDER — LAMOTRIGINE 25 MG PO TABS: 25.0000 mg | ORAL_TABLET | Freq: Every day | ORAL | Status: DC
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
## 2016-02-03 ENCOUNTER — Ambulatory Visit (INDEPENDENT_AMBULATORY_CARE_PROVIDER_SITE_OTHER): Payer: PRIVATE HEALTH INSURANCE | Admitting: Psychiatry

## 2016-02-03 DIAGNOSIS — F3160 Bipolar disorder, current episode mixed, unspecified: Secondary | ICD-10-CM | POA: Diagnosis not present

## 2016-02-03 DIAGNOSIS — F411 Generalized anxiety disorder: Secondary | ICD-10-CM

## 2016-02-03 MED ORDER — LAMOTRIGINE 25 MG PO TABS: 50.0000 mg | ORAL_TABLET | Freq: Every day | ORAL | Status: DC

## 2016-02-03 MED ORDER — ESCITALOPRAM OXALATE 10 MG PO TABS: 10.0000 mg | ORAL_TABLET | Freq: Every day | ORAL | Status: DC
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
## 2016-02-17 ENCOUNTER — Ambulatory Visit (INDEPENDENT_AMBULATORY_CARE_PROVIDER_SITE_OTHER): Payer: PRIVATE HEALTH INSURANCE | Admitting: Family Medicine

## 2016-02-17 ENCOUNTER — Encounter: Payer: Self-pay | Admitting: Family Medicine

## 2016-02-17 VITALS — BP 140/85 | HR 67 | Temp 98.3°F | Resp 16 | Ht 65.0 in | Wt 183.0 lb

## 2016-02-17 DIAGNOSIS — I10 Essential (primary) hypertension: Secondary | ICD-10-CM | POA: Diagnosis not present

## 2016-02-17 DIAGNOSIS — E0520 Thyrotoxicosis with toxic multinodular goiter without thyrotoxic crisis or storm: Secondary | ICD-10-CM

## 2016-02-17 DIAGNOSIS — F3130 Bipolar disorder, current episode depressed, mild or moderate severity, unspecified: Secondary | ICD-10-CM | POA: Diagnosis not present

## 2016-02-17 DIAGNOSIS — F319 Bipolar disorder, unspecified: Secondary | ICD-10-CM | POA: Insufficient documentation

## 2016-02-17 NOTE — Progress Notes (Signed)
Name: Gina Wells   MRN: [id number]    DOB: Dec 23, 1960   Date:02/17/2016       Progress Note  Subjective  Chief Complaint  Chief Complaint  Patient presents with  . Hypertension    HPI Here for f/u of HBP.  She has been diagnosed with bipolar disorder and has begun treatment.  She is noticing some improvement.  Dr. Tedd Sias cares for her Thyroid disease.  No problem-specific Assessment & Plan notes found for this encounter.   Past Medical History:  Diagnosis Date  . Alcoholism (HCC)   . Depression   . Fatigue   . Hx of migraine headaches   . Hyperthyroidism   . 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 KIDNEY STONE SURGERY    . URETERAL STENT PLACEMENT     2002 for kidney stones  . VAGINAL HYSTERECTOMY      Family History  Problem Relation Age of Onset  . Diabetes Mother   . Alcohol abuse Father   . Diabetes Father   . Cancer Father   . Cancer Paternal Aunt   . Alcohol abuse Brother     Social History   Social History  . Marital status: Single    Spouse name: N/A  . Number of children: N/A  . Years of education: N/A   Occupational History  . Not on file.   Social History Main Topics  . Smoking status: Current Every Day Smoker    Packs/day: 0.50    Types: Cigarettes    Start date: 01/18/1981  . Smokeless tobacco: Never Used  . Alcohol use No  . Drug use: No  . Sexual activity: Not Currently   Other Topics Concern  . Not on file   Social History Narrative  . No narrative on file     Current Outpatient Prescriptions:  .  escitalopram (LEXAPRO) 10 MG tablet, Take 1 tablet (10 mg total) by mouth daily., Disp: 30 tablet, Rfl: 0 .  lamoTRIgine (LAMICTAL) 25 MG tablet, Take 2 tablets (50 mg total) by mouth at bedtime., Disp: 60 tablet, Rfl: 0 .  levothyroxine (SYNTHROID, LEVOTHROID) 75 MCG tablet, Take 75 mcg by mouth daily., Disp: , Rfl:  .  metoprolol tartrate (LOPRESSOR) 25 MG tablet,  Take 1 tablet (25 mg total) by mouth 2 (two) times daily., Disp: 50 tablet, Rfl: 6 .  omeprazole (PRILOSEC) 40 MG capsule, Take 1 capsule (40 mg total) by mouth daily., Disp: 30 capsule, Rfl: 12  Not on File   Review of Systems  Constitutional: Negative for chills, fever, malaise/fatigue and weight loss.  HENT: Negative for hearing loss.   Eyes: Negative for blurred vision and double vision.  Respiratory: Negative for hemoptysis, shortness of breath and wheezing.   Cardiovascular: Negative for chest pain, palpitations and leg swelling.  Gastrointestinal: Negative for abdominal pain, blood in stool and nausea.  Genitourinary: Negative for dysuria, frequency and urgency.  Skin: Negative for rash.  Neurological: Negative for dizziness, tremors, weakness and headaches.      Objective  Vitals:   02/17/16 1412 02/17/16 1450  BP: 137/81 140/85  Pulse: 67   Resp: 16   Temp: 98.3 F (36.8 C)   TempSrc: Oral   Weight: 183 lb (83 kg)   Height: 5\' 5"  (1.651 m)     Physical Exam  Constitutional: She is well-developed, well-nourished, and in no distress. No distress.  HENT:  Head: Normocephalic and atraumatic.  Eyes: Conjunctivae and EOM are normal. Pupils are equal, round, and reactive to light. No scleral icterus.  Neck: Normal range of motion. Neck supple. Carotid bruit is not present. No thyromegaly present.  Cardiovascular: Normal rate, regular rhythm and normal heart sounds.  Exam reveals no gallop and no friction rub.   No murmur heard. Pulmonary/Chest: Effort normal and breath sounds normal. No respiratory distress. She has no wheezes. She has no rales.  Musculoskeletal: She exhibits no edema.  Lymphadenopathy:    She has no cervical adenopathy.  Psychiatric:  Affect more stable than before.  Bipolar disorder being addressed.  Vitals reviewed.      No results found for this or any previous visit (from the past 2160 hour(s)).   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     No orders of the defined types were placed in this encounter. 1. Essential hypertension Cont Lopressor  2. 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 Tedd Sias  3. Bipolar affective disorder, current episode depressed, current episode severity unspecified (HCC) Cont to see Psych

## 2016-02-22 ENCOUNTER — Ambulatory Visit: Payer: PRIVATE HEALTH INSURANCE | Admitting: Psychiatry

## 2016-03-01 ENCOUNTER — Ambulatory Visit (INDEPENDENT_AMBULATORY_CARE_PROVIDER_SITE_OTHER): Payer: PRIVATE HEALTH INSURANCE | Admitting: Psychiatry

## 2016-03-01 ENCOUNTER — Encounter: Payer: Self-pay | Admitting: Psychiatry

## 2016-03-01 VITALS — BP 154/83 | HR 76 | Temp 98.3°F | Ht 65.0 in | Wt 183.4 lb

## 2016-03-01 DIAGNOSIS — F3160 Bipolar disorder, current episode mixed, unspecified: Secondary | ICD-10-CM

## 2016-03-01 DIAGNOSIS — F411 Generalized anxiety disorder: Secondary | ICD-10-CM

## 2016-03-01 MED ORDER — LAMOTRIGINE 25 MG PO TABS: 50.0000 mg | ORAL_TABLET | Freq: Every day | ORAL | 1 refills | Status: DC

## 2016-03-01 MED ORDER — ESCITALOPRAM OXALATE 10 MG PO TABS: 10.0000 mg | ORAL_TABLET | Freq: Every day | ORAL | 1 refills | Status: DC
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
## 2016-03-02 ENCOUNTER — Telehealth: Payer: Self-pay | Admitting: Family Medicine

## 2016-03-02 NOTE — Telephone Encounter (Signed)
Patient  Saw psych yesterday and bp was 166/90 it was retaken later in visit 154/80. Patient was advised to let pcp know for advise. She is taking Metoprolol 25 mg bid.

## 2016-03-02 NOTE — Telephone Encounter (Signed)
Patient informed to continue to monitor bp/pulse. She needs 1 mos f/u unless pressures are staying high consistently.

## 2016-03-02 NOTE — Telephone Encounter (Signed)
Pt. Have  request that you call her [phone]

## 2016-03-02 NOTE — Telephone Encounter (Signed)
That pressure is more harmful long term and not short term.  Just keep an eye on pressure with a home BP cuff or at a pharmacy.  Cont. Her current meds.  Check pulses also with BP.  Let me see her in about a month unless pressures are gong up and staying up.-jh

## 2016-04-21 ENCOUNTER — Ambulatory Visit (INDEPENDENT_AMBULATORY_CARE_PROVIDER_SITE_OTHER): Payer: PRIVATE HEALTH INSURANCE | Admitting: Family Medicine

## 2016-04-21 ENCOUNTER — Encounter: Payer: Self-pay | Admitting: Family Medicine

## 2016-04-21 VITALS — BP 146/94 | HR 83 | Temp 98.0°F | Resp 16 | Ht 65.0 in | Wt 178.9 lb

## 2016-04-21 DIAGNOSIS — M5440 Lumbago with sciatica, unspecified side: Secondary | ICD-10-CM | POA: Diagnosis not present

## 2016-04-21 DIAGNOSIS — R8281 Pyuria: Secondary | ICD-10-CM

## 2016-04-21 DIAGNOSIS — N390 Urinary tract infection, site not specified: Secondary | ICD-10-CM

## 2016-04-21 LAB — POCT URINALYSIS DIPSTICK
GLUCOSE UA: NEGATIVE
KETONES UA: NEGATIVE
NITRITE UA: NEGATIVE
PH UA: 5
Protein, UA: NEGATIVE
Spec Grav, UA: 1.01
Urobilinogen, UA: 0.2

## 2016-04-21 MED ORDER — NITROFURANTOIN MONOHYD MACRO 100 MG PO CAPS: 100.0000 mg | ORAL_CAPSULE | Freq: Two times a day (BID) | ORAL | 0 refills | Status: DC

## 2016-04-21 MED ORDER — NAPROXEN 500 MG PO TABS: 500.0000 mg | ORAL_TABLET | Freq: Two times a day (BID) | ORAL | 3 refills | Status: DC

## 2016-04-21 MED ORDER — TRAMADOL HCL 50 MG PO TABS: 50.0000 mg | ORAL_TABLET | Freq: Three times a day (TID) | ORAL | 3 refills | Status: DC | PRN

## 2016-04-21 MED ORDER — CYCLOBENZAPRINE HCL 10 MG PO TABS: 10.0000 mg | ORAL_TABLET | Freq: Three times a day (TID) | ORAL | 2 refills | Status: DC | PRN

## 2016-04-21 NOTE — Progress Notes (Signed)
Name: Gina Wells   MRN: [id number]    DOB: 1960-09-26   Date:04/21/2016       Progress Note  Subjective  Chief Complaint  Chief Complaint  Patient presents with  . Back Pain    also has high blood pressure and has frequancy, not burning but helps with pain     HPI Here c/o of R sided back pain that starts in mid lower back and radiates to R flank.  Started slightly about 1 month ago.  Then recurred 2 days ago.  She is having some frequency and strong urine smell.  She used a back brace with some help last month, but this pain feels a litle different.  She has had kidney stones in past (2006).  No fever.  No N/V.  No problem-specific Assessment & Plan notes found for this encounter.   Past Medical History:  Diagnosis Date  . Alcoholism (HCC)   . Depression   . Fatigue   . Hx of migraine headaches   . Hyperthyroidism   . 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 KIDNEY STONE SURGERY    . URETERAL STENT PLACEMENT     2002 for kidney stones  . VAGINAL HYSTERECTOMY      Family History  Problem Relation Age of Onset  . Diabetes Mother   . Alcohol abuse Father   . Diabetes Father   . Cancer Father   . Cancer Paternal Aunt   . Alcohol abuse Brother     Social History   Social History  . Marital status: Single    Spouse name: N/A  . Number of children: N/A  . Years of education: N/A   Occupational History  . Not on file.   Social History Main Topics  . Smoking status: Current Every Day Smoker    Packs/day: 0.50    Types: Cigarettes    Start date: 01/18/1981  . Smokeless tobacco: Current User  . Alcohol use No  . Drug use: No  . Sexual activity: Not Currently   Other Topics Concern  . Not on file   Social History Narrative  . No narrative on file     Current Outpatient Prescriptions:  .  escitalopram (LEXAPRO) 10 MG tablet, Take 1 tablet (10 mg total) by mouth daily., Disp: 30 tablet, Rfl:  1 .  lamoTRIgine (LAMICTAL) 25 MG tablet, Take 2 tablets (50 mg total) by mouth at bedtime., Disp: 60 tablet, Rfl: 1 .  levothyroxine (SYNTHROID, LEVOTHROID) 75 MCG tablet, Take 75 mcg by mouth daily., Disp: , Rfl:  .  metoprolol tartrate (LOPRESSOR) 25 MG tablet, Take 1 tablet (25 mg total) by mouth 2 (two) times daily., Disp: 50 tablet, Rfl: 6 .  omeprazole (PRILOSEC) 40 MG capsule, Take 1 capsule (40 mg total) by mouth daily., Disp: 30 capsule, Rfl: 12 .  cyclobenzaprine (FLEXERIL) 10 MG tablet, Take 1 tablet (10 mg total) by mouth 3 (three) times daily as needed for muscle spasms., Disp: 30 tablet, Rfl: 2 .  naproxen (NAPROSYN) 500 MG tablet, Take 1 tablet (500 mg total) by mouth 2 (two) times daily with a meal., Disp: 30 tablet, Rfl: 3 .  nitrofurantoin, macrocrystal-monohydrate, (MACROBID) 100 MG capsule, Take 1 capsule (100 mg total) by mouth 2 (two) times daily., Disp: 10 capsule, Rfl: 0 .  traMADol (ULTRAM) 50 MG tablet, Take 1 tablet (50 mg total) by mouth every 8 (eight) hours as needed., Disp:  30 tablet, Rfl: 3  Not on File   Review of Systems  Constitutional: Negative for chills, fever, malaise/fatigue and weight loss.  HENT: Negative for hearing loss.   Eyes: Negative for blurred vision and double vision.  Respiratory: Negative for cough, shortness of breath and wheezing.   Cardiovascular: Negative for chest pain, palpitations and leg swelling.  Gastrointestinal: Positive for abdominal pain (R flank pain). Negative for blood in stool, diarrhea, heartburn, nausea and vomiting.  Genitourinary: Positive for flank pain, frequency, hematuria (microscopic) and urgency. Negative for dysuria.  Musculoskeletal: Negative for joint pain and myalgias.  Skin: Negative for rash.  Neurological: Negative for dizziness, tremors, weakness and headaches.      Objective  Vitals:   04/21/16 0902  BP: (!) 146/94  Pulse: 83  Resp: 16  Temp: 98 F (36.7 C)  TempSrc: Oral  Weight: 178 lb  14.4 oz (81.1 kg)  Height: 5\' 5"  (1.651 m)    Physical Exam  Constitutional: She is oriented to person, place, and time and well-developed, well-nourished, and in no distress. No distress.  HENT:  Head: Normocephalic and atraumatic.  Eyes: Conjunctivae and EOM are normal. Pupils are equal, round, and reactive to light. No scleral icterus.  Neck: Normal range of motion. Neck supple. No thyromegaly present.  Cardiovascular: Normal rate, regular rhythm and normal heart sounds.  Exam reveals no gallop and no friction rub.   No murmur heard. Pulmonary/Chest: Effort normal and breath sounds normal. No respiratory distress. She has no wheezes. She has no rales.  Abdominal: Soft. Bowel sounds are normal. There is tenderness (R flank and R mid superficial pain to palp.).  Musculoskeletal:  Pain over R LS area with SI joint pain to palp.  Neurological: She is alert and oriented to person, place, and time.  Vitals reviewed.      Recent Results (from the past 2160 hour(s))  POCT urinalysis dipstick     Status: Abnormal   Collection Time: 04/21/16  9:15 AM  Result Value Ref Range   Color, UA yellow    Clarity, UA clear    Glucose, UA neg    Bilirubin, UA trace    Ketones, UA neg    Spec Grav, UA 1.010    Blood, UA small    pH, UA 5.0    Protein, UA neg    Urobilinogen, UA 0.2    Nitrite, UA neg    Leukocytes, UA Trace (A) Negative     Assessment & Plan  Problem List Items Addressed This Visit    None    Visit Diagnoses    UTI (lower urinary tract infection)    -  Primary   Relevant Medications   nitrofurantoin, macrocrystal-monohydrate, (MACROBID) 100 MG capsule   Other Relevant Orders   POCT urinalysis dipstick (Completed)   Urine Culture   Pyuria       Low back pain with sciatica, sciatica laterality unspecified, unspecified back pain laterality       Relevant Medications   naproxen (NAPROSYN) 500 MG tablet   cyclobenzaprine (FLEXERIL) 10 MG tablet   traMADol (ULTRAM)  50 MG tablet      Meds ordered this encounter  Medications  . naproxen (NAPROSYN) 500 MG tablet    Sig: Take 1 tablet (500 mg total) by mouth 2 (two) times daily with a meal.    Dispense:  30 tablet    Refill:  3  . cyclobenzaprine (FLEXERIL) 10 MG tablet    Sig: Take 1 tablet (10  mg total) by mouth 3 (three) times daily as needed for muscle spasms.    Dispense:  30 tablet    Refill:  2  . traMADol (ULTRAM) 50 MG tablet    Sig: Take 1 tablet (50 mg total) by mouth every 8 (eight) hours as needed.    Dispense:  30 tablet    Refill:  3  . nitrofurantoin, macrocrystal-monohydrate, (MACROBID) 100 MG capsule    Sig: Take 1 capsule (100 mg total) by mouth 2 (two) times daily.    Dispense:  10 capsule    Refill:  0   1. UTI (lower urinary tract infection)  - POCT urinalysis dipstick - Urine Culture - nitrofurantoin, macrocrystal-monohydrate, (MACROBID) 100 MG capsule; Take 1 capsule (100 mg total) by mouth 2 (two) times daily.  Dispense: 10 capsule; Refill: 0  2. Pyuria   3. Low back pain with sciatica, sciatica laterality unspecified, unspecified back pain laterality  - naproxen (NAPROSYN) 500 MG tablet; Take 1 tablet (500 mg total) by mouth 2 (two) times daily with a meal.  Dispense: 30 tablet; Refill: 3 - cyclobenzaprine (FLEXERIL) 10 MG tablet; Take 1 tablet (10 mg total) by mouth 3 (three) times daily as needed for muscle spasms.  Dispense: 30 tablet; Refill: 2 - traMADol (ULTRAM) 50 MG tablet; Take 1 tablet (50 mg total) by mouth every 8 (eight) hours as needed.  Dispense: 30 tablet; Refill: 3

## 2016-04-22 LAB — URINE CULTURE

## 2016-05-01 ENCOUNTER — Ambulatory Visit: Payer: Self-pay | Admitting: Psychiatry

## 2016-06-26 ENCOUNTER — Ambulatory Visit: Payer: Self-pay | Admitting: Family Medicine

## 2016-08-01 ENCOUNTER — Telehealth: Payer: Self-pay | Admitting: *Deleted

## 2016-08-01 MED ORDER — LAMOTRIGINE 25 MG PO TABS: 50.0000 mg | ORAL_TABLET | Freq: Every day | ORAL | 0 refills | Status: DC

## 2016-08-01 MED ORDER — ESCITALOPRAM OXALATE 10 MG PO TABS: 10.0000 mg | ORAL_TABLET | Freq: Every day | ORAL | 0 refills | Status: DC

## 2016-08-01 NOTE — Telephone Encounter (Signed)
We can send off 1 month supply with no refills for the Lamictal and Effexor.  This will bet he only time that I can do this as I do not generally write this combination of meds.  Prescribed as per the last chart note here.-jh

## 2016-08-01 NOTE — Telephone Encounter (Signed)
Patient says one her insurance will not cover psychiatry visits. She can't afford a visit this month and wants to know if you can do a 1 time refill of Escitalopram and Lamotrigine.

## 2016-08-25 ENCOUNTER — Ambulatory Visit: Payer: PRIVATE HEALTH INSURANCE | Admitting: Psychiatry

## 2017-03-08 ENCOUNTER — Ambulatory Visit: Payer: PRIVATE HEALTH INSURANCE | Admitting: Adult Health Nurse Practitioner

## 2017-03-08 VITALS — BP 193/108 | HR 68 | Temp 98.3°F | Wt 187.3 lb

## 2017-03-08 DIAGNOSIS — I10 Essential (primary) hypertension: Secondary | ICD-10-CM

## 2017-03-08 DIAGNOSIS — J432 Centrilobular emphysema: Secondary | ICD-10-CM | POA: Insufficient documentation

## 2017-03-08 DIAGNOSIS — J449 Chronic obstructive pulmonary disease, unspecified: Secondary | ICD-10-CM

## 2017-03-08 DIAGNOSIS — E890 Postprocedural hypothyroidism: Secondary | ICD-10-CM

## 2017-03-08 DIAGNOSIS — Z0000 Encounter for general adult medical examination without abnormal findings: Secondary | ICD-10-CM

## 2017-03-08 MED ORDER — FLUTICASONE-SALMETEROL 250-50 MCG/DOSE IN AEPB: 1.0000 | INHALATION_SPRAY | Freq: Two times a day (BID) | RESPIRATORY_TRACT | 3 refills | Status: DC

## 2017-03-08 MED ORDER — LEVOTHYROXINE SODIUM 75 MCG PO TABS: 75.0000 ug | ORAL_TABLET | Freq: Every day | ORAL | 3 refills | Status: DC

## 2017-03-08 MED ORDER — ALBUTEROL SULFATE HFA 108 (90 BASE) MCG/ACT IN AERS: 1.0000 | INHALATION_SPRAY | Freq: Four times a day (QID) | RESPIRATORY_TRACT | 11 refills | Status: DC | PRN

## 2017-03-08 MED ORDER — METOPROLOL TARTRATE 25 MG PO TABS: 25.0000 mg | ORAL_TABLET | Freq: Two times a day (BID) | ORAL | 3 refills | Status: DC

## 2017-03-08 MED ORDER — HYDROCHLOROTHIAZIDE 25 MG PO TABS: 25.0000 mg | ORAL_TABLET | Freq: Every day | ORAL | 3 refills | Status: DC

## 2017-03-08 NOTE — Progress Notes (Signed)
Patient: Gina Wells Female    DOB: 1960/09/01   56 y.o.   MRN: [id number] Visit Date: 03/08/2017  Today's Provider: Jacelyn Pi, NP   Chief Complaint  Patient presents with  . Shortness of Breath  . Chest Pain  . Hypertension   Subjective:    HPI   Pt states that her BP has been high for a few months and she has been out of her medications.  Pt states that she has headaches and dizziness since she has been off the medications.  Taking BC powders 3-4 times per day.   Pt states that she was taking Synthroid 75 mcg daily- has been out for months. Pt states that her TSH was never therapeutic.  Hx of multinodular goiter s/p ablation.   Pt states that she feels anxious, tired and palpitations when she gets excited.   Pt states that she is wheezing day and night, she is a 1/2 ppd smoker.  40 year smoker.  Pt states that she has clear sputum in the am only.  Pt states that she feels SOB all the time even at rest.   Paternal- lung, breast prostate CA.    No Known Allergies Previous Medications   CYCLOBENZAPRINE (FLEXERIL) 10 MG TABLET    Take 1 tablet (10 mg total) by mouth 3 (three) times daily as needed for muscle spasms.   ESCITALOPRAM (LEXAPRO) 10 MG TABLET    Take 1 tablet (10 mg total) by mouth daily.   LAMOTRIGINE (LAMICTAL) 25 MG TABLET    Take 2 tablets (50 mg total) by mouth at bedtime.   LEVOTHYROXINE (SYNTHROID, LEVOTHROID) 75 MCG TABLET    Take 75 mcg by mouth daily.   METOPROLOL TARTRATE (LOPRESSOR) 25 MG TABLET    Take 1 tablet (25 mg total) by mouth 2 (two) times daily.   NAPROXEN (NAPROSYN) 500 MG TABLET    Take 1 tablet (500 mg total) by mouth 2 (two) times daily with a meal.   NITROFURANTOIN, MACROCRYSTAL-MONOHYDRATE, (MACROBID) 100 MG CAPSULE    Take 1 capsule (100 mg total) by mouth 2 (two) times daily.   OMEPRAZOLE (PRILOSEC) 40 MG CAPSULE    Take 1 capsule (40 mg total) by mouth daily.   TRAMADOL (ULTRAM) 50 MG TABLET    Take 1 tablet (50 mg  total) by mouth every 8 (eight) hours as needed.    Review of Systems  All other systems reviewed and are negative.   Social History  Substance Use Topics  . Smoking status: Current Every Day Smoker    Packs/day: 0.50    Types: Cigarettes    Start date: 01/18/1981  . Smokeless tobacco: Current User  . Alcohol use No   Objective:   BP (!) 193/108   Pulse 68   Temp 98.3 F (36.8 C)   Wt 187 lb 4.8 oz (85 kg)   BMI 31.17 kg/m   Physical Exam  Constitutional: She is oriented to person, place, and time. She appears well-developed and well-nourished.  HENT:  Head: Normocephalic and atraumatic.  Eyes: Pupils are equal, round, and reactive to light.  Neck: Normal range of motion. Neck supple.  Cardiovascular: Normal rate, regular rhythm, normal heart sounds and intact distal pulses.   Pulmonary/Chest: Effort normal and breath sounds normal.  Abdominal: Soft. Bowel sounds are normal.  Musculoskeletal: She exhibits no edema.  Neurological: She is alert and oriented to person, place, and time.  Cranial nerves 2-12 grossly intact.   Skin: Skin is warm and  dry.  Vitals reviewed.       Assessment & Plan:      HTN:   Not controlled.  Goal BP <140/90.  Restart metoprolol 25mg  BID.  Discussed d/c of BC powders due to risk for hemorrhagic CVA.  Discussed s/s of CVA.   Encourage low salt diet and exercise.   Hypothyroidism:  Check TSH.  Restart Levothyroxine at current dose of 75 mcg.   COPD:  Newly diagnosed.  Rx of albuterol rescue inhaler.  Rx for advair. Discussed diagnosis and medications.  Encouraged smoking cessation.   Discussed [facility] and [facility] for Psychiatric services.           Jacelyn Pieah Doles-Johnson, NP   [facility] [facility]

## 2017-03-08 NOTE — Patient Instructions (Signed)

## 2017-03-09 LAB — COMPREHENSIVE METABOLIC PANEL
ALBUMIN: 4.3 g/dL (ref 3.5–5.5)
ALT: 9 IU/L (ref 0–32)
AST: 13 IU/L (ref 0–40)
Albumin/Globulin Ratio: 1.8 (ref 1.2–2.2)
Alkaline Phosphatase: 121 IU/L — ABNORMAL HIGH (ref 39–117)
BUN / CREAT RATIO: 11 (ref 9–23)
BUN: 10 mg/dL (ref 6–24)
Bilirubin Total: 0.4 mg/dL (ref 0.0–1.2)
CALCIUM: 9.2 mg/dL (ref 8.7–10.2)
CO2: 23 mmol/L (ref 20–29)
Chloride: 102 mmol/L (ref 96–106)
Creatinine, Ser: 0.92 mg/dL (ref 0.57–1.00)
GFR calc Af Amer: 81 mL/min/{1.73_m2} (ref >59)
GFR, EST NON AFRICAN AMERICAN: 70 mL/min/{1.73_m2} (ref >59)
Globulin, Total: 2.4 g/dL (ref 1.5–4.5)
Glucose: 80 mg/dL (ref 65–99)
Potassium: 3.9 mmol/L (ref 3.5–5.2)
SODIUM: 140 mmol/L (ref 134–144)
Total Protein: 6.7 g/dL (ref 6.0–8.5)

## 2017-03-09 LAB — LIPID PANEL
Chol/HDL Ratio: 3.8 ratio (ref 0.0–4.4)
Cholesterol, Total: 233 mg/dL — ABNORMAL HIGH (ref 100–199)
HDL: 62 mg/dL (ref >39)
LDL CALC: 151 mg/dL — AB (ref 0–99)
Triglycerides: 101 mg/dL (ref 0–149)
VLDL Cholesterol Cal: 20 mg/dL (ref 5–40)

## 2017-03-09 LAB — CBC
HEMOGLOBIN: 13 g/dL (ref 11.1–15.9)
Hematocrit: 39.8 % (ref 34.0–46.6)
MCH: 29.1 pg (ref 26.6–33.0)
MCHC: 32.7 g/dL (ref 31.5–35.7)
MCV: 89 fL (ref 79–97)
Platelets: 271 10*3/uL (ref 150–379)
RBC: 4.46 x10E6/uL (ref 3.77–5.28)
RDW: 14.4 % (ref 12.3–15.4)
WBC: 11.3 10*3/uL — AB (ref 3.4–10.8)

## 2017-03-09 LAB — HEMOGLOBIN A1C
ESTIMATED AVERAGE GLUCOSE: 128 mg/dL
HEMOGLOBIN A1C: 6.1 % — AB (ref 4.8–5.6)

## 2017-03-09 LAB — TSH: TSH: 88.05 u[IU]/mL — AB (ref 0.450–4.500)

## 2017-03-27 ENCOUNTER — Ambulatory Visit: Payer: Self-pay

## 2017-04-04 ENCOUNTER — Encounter: Payer: Self-pay | Admitting: Internal Medicine

## 2017-04-04 ENCOUNTER — Ambulatory Visit: Payer: PRIVATE HEALTH INSURANCE | Admitting: Internal Medicine

## 2017-04-04 VITALS — BP 146/87 | HR 69 | Wt 186.0 lb

## 2017-04-04 DIAGNOSIS — R079 Chest pain, unspecified: Secondary | ICD-10-CM

## 2017-04-04 DIAGNOSIS — E039 Hypothyroidism, unspecified: Secondary | ICD-10-CM

## 2017-04-04 MED ORDER — LEVOTHYROXINE SODIUM 75 MCG PO TABS: 75.0000 ug | ORAL_TABLET | Freq: Every day | ORAL | 3 refills | Status: DC

## 2017-04-04 NOTE — Progress Notes (Signed)
   Subjective:    Patient ID: Gina Wells, female    DOB: [birth year], 56 y.o.   MRN: [id number]  HPI   Pt presents with constant, sharp pain across upper chest since yesterday. Pain gets slightly worse when she breathes out. Pt states that it is hard to breathe. Pt was not doing anything strenuous when the pain started.  Pt presents with increased fatigue for several weeks. Insomnia intermittent.  Pt presents with a thyroid condition. She ran out of medication about a week ago.    Patient Active Problem List   Diagnosis Date Noted  . COPD (chronic obstructive pulmonary disease) (HCC) 03/08/2017  . 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 Bipolar disorder (HCC) 02/17/2016  . HBP (high blood pressure) 11/23/2015  . GERD (gastroesophageal reflux disease) 05/12/2015  . 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 Clinical depression 01/18/2015  . Dysuria-frequency syndrome 01/18/2015  . Fatigue 01/18/2015  . Encounter for general adult medical examination without abnormal findings 01/18/2015  . Cephalalgia 01/18/2015  . Mechanical and motor problems with internal organs 01/18/2015    Allergies as of 04/04/2017   No Known Allergies     Medication List       Accurate as of 04/04/17 11:14 AM. Always use your most recent med list.          albuterol 108 (90 Base) MCG/ACT inhaler Commonly known as:  PROVENTIL HFA;VENTOLIN HFA Inhale 1-2 puffs into the lungs every 6 (six) hours as needed for wheezing or shortness of breath.   escitalopram 10 MG tablet Commonly known as:  LEXAPRO Take 1 tablet (10 mg total) by mouth daily.   Fluticasone-Salmeterol 250-50 MCG/DOSE Aepb Commonly known as:  ADVAIR Inhale 1 puff into the lungs 2 (two) times daily.   hydrochlorothiazide 25 MG tablet Commonly known as:  HYDRODIURIL Take 1 tablet (25 mg total) by mouth daily.   lamoTRIgine 25 MG tablet Commonly known as:  LAMICTAL Take 2 tablets (50 mg total) by mouth at bedtime.    levothyroxine 75 MCG tablet Commonly known as:  SYNTHROID, LEVOTHROID Take 1 tablet (75 mcg total) by mouth daily.   omeprazole 40 MG capsule Commonly known as:  PRILOSEC Take 1 capsule (40 mg total) by mouth daily.       Review of Systems     Objective:   Physical Exam  Constitutional: She is oriented to person, place, and time.  Cardiovascular: Normal rate, regular rhythm and normal heart sounds.   Pulmonary/Chest: Effort normal and breath sounds normal.  Neurological: She is alert and oriented to person, place, and time.     BP (!) 146/87   Pulse 69   Wt 186 lb (84.4 kg)   BMI 30.95 kg/m         Assessment & Plan:   Pt was advised to go to the ER today to get a chest x-ray and EKG to determine cause for chest pain. Pt needs an emergengy room referral for chest pain evaluation.  Labs today: T4/ TSH  Refill Levothyroxine medication  Pt needs an x-ray on her left hand for nodule on her third finger between the second and third joint.   Follow-up in 4 weeks with labs a week prior.

## 2017-04-04 NOTE — Progress Notes (Signed)
Since yesterday, constant sharp pain across upper chest.  DX with COPD here 3 weeks ago.  Increased fatigue for several weeks.  Insomnia intermittant.  Admits stress has gone up in her life.  Needs thyroid meds.

## 2017-04-05 ENCOUNTER — Telehealth: Payer: Self-pay | Admitting: Pharmacy Technician

## 2017-04-05 LAB — T4: T4, Total: 5.3 ug/dL (ref 4.5–12.0)

## 2017-04-05 LAB — TSH: TSH: 44.28 u[IU]/mL — AB (ref 0.450–4.500)

## 2017-04-05 NOTE — Telephone Encounter (Signed)
Patient eligible to receive medication assistance at [facility] through 2018, as long as eligibility requirements continue to be met.  Aidan Caloca J. Waldo Damian Care Manager [facility] 

## 2017-04-25 ENCOUNTER — Other Ambulatory Visit: Payer: Self-pay

## 2017-05-02 ENCOUNTER — Ambulatory Visit: Payer: Self-pay | Admitting: Internal Medicine

## 2017-05-03 ENCOUNTER — Telehealth: Payer: Self-pay

## 2017-05-03 NOTE — Telephone Encounter (Signed)
Called pt to make FU appt. No answer left message. Thyroid labs are low needs discussing.

## 2017-06-25 ENCOUNTER — Telehealth: Payer: Self-pay | Admitting: Pharmacist

## 2017-06-25 NOTE — Telephone Encounter (Signed)
06/25/17 Mailing Denial letter to patient for Ventolin, paperwork mailed to patient to sign and return 04/09/17, have not received back.Forde RadonAJ

## 2017-09-14 ENCOUNTER — Emergency Department: Payer: No Typology Code available for payment source

## 2017-09-14 ENCOUNTER — Emergency Department
Admission: EM | Admit: 2017-09-14 | Discharge: 2017-09-14 | Disposition: A | Discharge status: Home / Self Care | Payer: No Typology Code available for payment source | Attending: Emergency Medicine | Admitting: Emergency Medicine

## 2017-09-14 ENCOUNTER — Encounter: Payer: Self-pay | Admitting: Emergency Medicine

## 2017-09-14 DIAGNOSIS — F17210 Nicotine dependence, cigarettes, uncomplicated: Secondary | ICD-10-CM | POA: Insufficient documentation

## 2017-09-14 DIAGNOSIS — R51 Headache: Secondary | ICD-10-CM | POA: Insufficient documentation

## 2017-09-14 DIAGNOSIS — R42 Dizziness and giddiness: Secondary | ICD-10-CM | POA: Insufficient documentation

## 2017-09-14 DIAGNOSIS — R0602 Shortness of breath: Secondary | ICD-10-CM | POA: Insufficient documentation

## 2017-09-14 DIAGNOSIS — K219 Gastro-esophageal reflux disease without esophagitis: Secondary | ICD-10-CM

## 2017-09-14 DIAGNOSIS — E039 Hypothyroidism, unspecified: Secondary | ICD-10-CM

## 2017-09-14 DIAGNOSIS — R5383 Other fatigue: Secondary | ICD-10-CM | POA: Insufficient documentation

## 2017-09-14 DIAGNOSIS — F17228 Nicotine dependence, chewing tobacco, with other nicotine-induced disorders: Secondary | ICD-10-CM | POA: Insufficient documentation

## 2017-09-14 DIAGNOSIS — J449 Chronic obstructive pulmonary disease, unspecified: Secondary | ICD-10-CM | POA: Insufficient documentation

## 2017-09-14 DIAGNOSIS — I10 Essential (primary) hypertension: Secondary | ICD-10-CM | POA: Insufficient documentation

## 2017-09-14 LAB — CBC WITH DIFFERENTIAL/PLATELET
Basophils Absolute: 0 10*3/uL (ref 0–0.1)
Basophils Relative: 1 %
EOS ABS: 0.1 10*3/uL (ref 0–0.7)
EOS PCT: 1 %
HCT: 40.4 % (ref 35.0–47.0)
Hemoglobin: 13.4 g/dL (ref 12.0–16.0)
LYMPHS ABS: 3.3 10*3/uL (ref 1.0–3.6)
Lymphocytes Relative: 36 %
MCH: 30 pg (ref 26.0–34.0)
MCHC: 33.1 g/dL (ref 32.0–36.0)
MCV: 90.7 fL (ref 80.0–100.0)
MONOS PCT: 4 %
Monocytes Absolute: 0.4 10*3/uL (ref 0.2–0.9)
Neutro Abs: 5.4 10*3/uL (ref 1.4–6.5)
Neutrophils Relative %: 58 %
PLATELETS: 249 10*3/uL (ref 150–440)
RBC: 4.46 MIL/uL (ref 3.80–5.20)
RDW: 14.4 % (ref 11.5–14.5)
WBC: 9.2 10*3/uL (ref 3.6–11.0)

## 2017-09-14 LAB — BASIC METABOLIC PANEL
Anion gap: 9 (ref 5–15)
BUN: 15 mg/dL (ref 6–20)
CHLORIDE: 107 mmol/L (ref 101–111)
CO2: 25 mmol/L (ref 22–32)
Calcium: 9.1 mg/dL (ref 8.9–10.3)
Creatinine, Ser: 0.94 mg/dL (ref 0.44–1.00)
GFR calc Af Amer: >60 mL/min (ref >60)
GFR calc non Af Amer: >60 mL/min (ref >60)
Glucose, Bld: 82 mg/dL (ref 65–99)
Potassium: 4.2 mmol/L (ref 3.5–5.1)
SODIUM: 141 mmol/L (ref 135–145)

## 2017-09-14 LAB — TSH: TSH: 82.501 u[IU]/mL — ABNORMAL HIGH (ref 0.350–4.500)

## 2017-09-14 LAB — TROPONIN I

## 2017-09-14 MED ORDER — ALBUTEROL SULFATE HFA 108 (90 BASE) MCG/ACT IN AERS: 1.0000 | INHALATION_SPRAY | Freq: Four times a day (QID) | RESPIRATORY_TRACT | 1 refills | Status: DC | PRN

## 2017-09-14 MED ORDER — HYDROCHLOROTHIAZIDE 25 MG PO TABS: 25.0000 mg | ORAL_TABLET | Freq: Every day | ORAL | 1 refills | Status: DC

## 2017-09-14 MED ORDER — LEVOTHYROXINE SODIUM 75 MCG PO TABS: 75.0000 ug | ORAL_TABLET | Freq: Every day | ORAL | 1 refills | Status: DC

## 2017-09-14 MED ORDER — FLUTICASONE-SALMETEROL 250-50 MCG/DOSE IN AEPB: 1.0000 | INHALATION_SPRAY | Freq: Two times a day (BID) | RESPIRATORY_TRACT | 1 refills | Status: DC

## 2017-09-14 MED ORDER — IPRATROPIUM-ALBUTEROL 0.5-2.5 (3) MG/3ML IN SOLN
3.0000 mL | Freq: Once | RESPIRATORY_TRACT | Status: AC
  Administered 2017-09-14: 3 mL via RESPIRATORY_TRACT
  Filled 2017-09-14: qty 3

## 2017-09-14 MED ORDER — ACETAMINOPHEN 325 MG PO TABS
650.0000 mg | ORAL_TABLET | Freq: Once | ORAL | Status: AC
  Administered 2017-09-14: 650 mg via ORAL
  Filled 2017-09-14: qty 2

## 2017-09-14 NOTE — ED Notes (Signed)
Patient signed for discharge instructions but pad did not accept signature.  Computer froze and unable to capture signature.  Patient denies any other questions and understands instructions and prescriptions.

## 2017-09-14 NOTE — ED Triage Notes (Signed)
First Nurse Note:  C/O headache and difficulty breathing.  AAOx3.  Skin warm and dry.  MAE equally and strong.  NAD

## 2017-09-14 NOTE — Discharge Instructions (Addendum)
I have represcribed 1 month supply with 1 refill of your thyroid, blood pressure, and COPD medications.  You should follow-up with a primary care doctor within the next few weeks if possible.  Return to the emergency department immediately for new or worsening dizziness, lightheadedness, difficulty breathing, severe headache, fevers, vomiting, or any other new or worsening symptoms that concern you.

## 2017-09-14 NOTE — ED Triage Notes (Signed)
Pt comes into the ED via POV c/o headache, shortness of breath, and lightheadedness at times x 1 month. Patient states she has not been taking her thyroid or HTN medication for a couple of months, patient also has COPD.  Patient has even and unlabored respirations at this time but she states the shortness of breath gets worse with exertion.  Patient neurologically intact at this time.

## 2017-09-14 NOTE — ED Provider Notes (Signed)
[facility] Emergency Department Provider Note ____________________________________________   First MD Initiated Contact with Patient 09/14/17 [phone]     (approximate)  I have reviewed the triage vital signs and the nursing notes.   HISTORY  Chief Complaint Headache and Shortness of Breath    HPI Gina Wells is a 57 y.o. female with past medical history as noted below who presents with multiple symptoms over approximately the last 2 months, primarily frontal headache which is intermittent but present daily, shortness of breath, lightheadedness, and some fatigue.  The patient states that she ran out of all of her chronic medications due to an insurance problem and has not been on them for the last few months, and the symptoms have occurred over this time.  The patient states she specifically came in today because she felt lightheaded while at work, and felt that she was about to pass out.   Past Medical History:  Diagnosis Date  . Alcoholism (HCC)   . Anxiety   . Depression   . Fatigue   . Hx of migraine headaches   . Hypertension   . Hyperthyroidism   . Toxic multinodular goiter w/o crisis     Patient Active Problem List   Diagnosis Date Noted  . COPD (chronic obstructive pulmonary disease) (HCC) 03/08/2017  . 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 Bipolar disorder (HCC) 02/17/2016  . HBP (high blood pressure) 11/23/2015  . GERD (gastroesophageal reflux disease) 05/12/2015  . 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 Clinical depression 01/18/2015  . Dysuria-frequency syndrome 01/18/2015  . Fatigue 01/18/2015  . Encounter for general adult medical examination without abnormal findings 01/18/2015  . Cephalalgia 01/18/2015  . Mechanical and motor problems with internal organs 01/18/2015    Past Surgical History:  Procedure Laterality Date  . KIDNEY STONE SURGERY    . URETERAL STENT PLACEMENT     2002 for kidney stones  . VAGINAL  HYSTERECTOMY      Prior to Admission medications   Medication Sig Start Date End Date Taking? Authorizing Provider  Aspirin-Salicylamide-Caffeine (BC HEADACHE POWDER PO) Take 1 Package by mouth once.   Yes [provider]  albuterol (PROVENTIL HFA;VENTOLIN HFA) 108 (90 Base) MCG/ACT inhaler Inhale 1-2 puffs into the lungs every 6 (six) hours as needed for wheezing or shortness of breath. Patient not taking: Reported on 09/14/2017 03/08/17   Doles-Johnson, Teah, NP  escitalopram (LEXAPRO) 10 MG tablet Take 1 tablet (10 mg total) by mouth daily. Patient not taking: Reported on 09/14/2017 08/01/16   Janeann Forehand., MD  Fluticasone-Salmeterol (ADVAIR) 250-50 MCG/DOSE AEPB Inhale 1 puff into the lungs 2 (two) times daily. Patient not taking: Reported on 09/14/2017 03/08/17   Doles-Johnson, Teah, NP  hydrochlorothiazide (HYDRODIURIL) 25 MG tablet Take 1 tablet (25 mg total) by mouth daily. Patient not taking: Reported on 09/14/2017 03/08/17   Doles-Johnson, Teah, NP  lamoTRIgine (LAMICTAL) 25 MG tablet Take 2 tablets (50 mg total) by mouth at bedtime. Patient not taking: Reported on 04/04/2017 08/01/16   Janeann Forehand., MD  levothyroxine (SYNTHROID, LEVOTHROID) 75 MCG tablet Take 1 tablet (75 mcg total) by mouth daily. Patient not taking: Reported on 09/14/2017 04/04/17 04/04/18  Virl Axe, MD  omeprazole (PRILOSEC) 40 MG capsule Take 1 capsule (40 mg total) by mouth daily. Patient not taking: Reported on 09/14/2017 05/12/15 05/11/16  Janeann Forehand., MD    Allergies Patient has no known allergies.  Family History  Problem Relation Age  of Onset  . Diabetes Mother   . Alcohol abuse Father   . Diabetes Father   . Cancer Father   . Cancer Paternal Aunt   . Alcohol abuse Brother     Social History Social History   Tobacco Use  . Smoking status: Current Every Day Smoker    Packs/day: 0.50    Types: Cigarettes    Start date: 01/18/1981  . Smokeless tobacco: Current User    Substance Use Topics  . Alcohol use: No    Alcohol/week: 0.0 oz  . Drug use: No    Review of Systems  Constitutional: Positive for fatigue. Eyes: No redness. ENT: No sore throat. Cardiovascular: Denies chest pain. Respiratory: Positive for shortness of breath. Gastrointestinal: Positive for nausea, no vomiting.  No diarrhea.  Genitourinary: Negative for dysuria.  Musculoskeletal: Negative for back pain. Skin: Negative for rash. Neurological: Positive for headache.   ____________________________________________   PHYSICAL EXAM:  VITAL SIGNS: ED Triage Vitals  Enc Vitals Group     BP 09/14/17 0848 (!) 180/88     Pulse Rate 09/14/17 0848 69     Resp 09/14/17 0848 17     Temp 09/14/17 0848 97.8 F (36.6 C)     Temp Source 09/14/17 0848 Oral     SpO2 09/14/17 0848 98 %     Weight 09/14/17 0848 183 lb (83 kg)     Height 09/14/17 0848 5\' 4"  (1.626 m)     Head Circumference --      Peak Flow --      Pain Score 09/14/17 0901 7     Pain Loc --      Pain Edu? --      Excl. in GC? --     Constitutional: Alert and oriented. Well appearing and in no acute distress. Eyes: Conjunctivae are normal.  Head: Atraumatic. Nose: No congestion/rhinnorhea. Mouth/Throat: Mucous membranes are moist.   Neck: Normal range of motion.  Cardiovascular: Normal rate, regular rhythm. Grossly normal heart sounds.  Good peripheral circulation. Respiratory: Normal respiratory effort.  No retractions.  Slightly decreased breath sounds bilaterally with prolonged expiratory phase.. Gastrointestinal: Soft and nontender. No distention.  Genitourinary: No CVA tenderness. Musculoskeletal: No lower extremity edema.  Extremities warm and well perfused.  Neurologic:  Normal speech and language. No gross focal neurologic deficits are appreciated.  Skin:  Skin is warm and dry. No rash noted. Psychiatric: Mood and affect are normal. Speech and behavior are  normal.  ____________________________________________   LABS (all labs ordered are listed, but only abnormal results are displayed)  Labs Reviewed  TSH - Abnormal; Notable for the following components:      Result Value   TSH 82.501 (*)    All other components within normal limits  BASIC METABOLIC PANEL  CBC WITH DIFFERENTIAL/PLATELET  TROPONIN I   ____________________________________________  EKG  ED ECG REPORT I, Dionne BucySebastian Gillian Meeuwsen, the attending physician, personally viewed and interpreted this ECG.  Date: 09/14/2017 EKG Time: 852 Rate: 64 Rhythm: normal sinus rhythm QRS Axis: normal Intervals: normal ST/T Wave abnormalities: Nonspecific T wave flattening anterior laterally Narrative Interpretation: no evidence of acute ischemia  ____________________________________________  RADIOLOGY  CXR: No focal infiltrate or other acute findings  ____________________________________________   PROCEDURES  Procedure(s) performed: No  Procedures  Critical Care performed: No ____________________________________________   INITIAL IMPRESSION / ASSESSMENT AND PLAN / ED COURSE  Pertinent labs & imaging results that were available during my care of the patient were reviewed by me and considered  in my medical decision making (see chart for details).  57 year old female with past medical history as noted above including thyroid disease and COPD presents with multiple symptoms including headache, lightheadedness, fatigue, and shortness of breath over the last few months, after running chronic medications and being unable to refill them due to an insurance issue.  Past medical records reviewed in Epic and are noncontributory.  On exam, the patient is relatively well-appearing, she is hypertensive but her other vital signs are normal, and the remainder the exam is unremarkable except for somewhat decreased breath sounds bilaterally.  Differential includes most likely symptoms  related to her chronic conditions including hypothyroidism and COPD, versus less likely viral syndrome or other acute infection, acute COPD exacerbation, dehydration or electrolyte abnormality.  Plan: Chest x-ray, basic labs, TSH, and reassess.  If negative ED workup, anticipate discharge home with new prescriptions for her chronic medications as documented.    ----------------------------------------- 1:48 PM on 09/14/2017 -----------------------------------------  Chest x-ray and lab workup are unremarkable except for elevated TSH which is consistent with hyperthyroidism after patient has not been on her Synthroid.  There is no evidence of myxedema or other acute thyroid emergency.  No indication for admission this time.  The patient feels comfortable to go home.  I will prescribe her for her chronic medications that she has been off of for the last few months, and she states that she has a primary care doctor that she can arrange follow-up with.  Return precautions given, the patient expresses understanding.  ____________________________________________   FINAL CLINICAL IMPRESSION(S) / ED DIAGNOSES  Final diagnoses:  Dizziness  Hypothyroidism, unspecified type      NEW MEDICATIONS STARTED DURING THIS VISIT:  New Prescriptions   No medications on file     Note:  This document was prepared using Dragon voice recognition software and may include unintentional dictation errors.    Dionne Bucy, MD 09/14/17 1349

## 2017-10-30 ENCOUNTER — Emergency Department
Admission: EM | Admit: 2017-10-30 | Discharge: 2017-10-30 | Disposition: A | Discharge status: Home / Self Care | Payer: No Typology Code available for payment source | Attending: Emergency Medicine | Admitting: Emergency Medicine

## 2017-10-30 ENCOUNTER — Encounter: Payer: Self-pay | Admitting: Emergency Medicine

## 2017-10-30 DIAGNOSIS — F17210 Nicotine dependence, cigarettes, uncomplicated: Secondary | ICD-10-CM | POA: Insufficient documentation

## 2017-10-30 DIAGNOSIS — K047 Periapical abscess without sinus: Secondary | ICD-10-CM | POA: Insufficient documentation

## 2017-10-30 DIAGNOSIS — J449 Chronic obstructive pulmonary disease, unspecified: Secondary | ICD-10-CM | POA: Insufficient documentation

## 2017-10-30 DIAGNOSIS — Z79899 Other long term (current) drug therapy: Secondary | ICD-10-CM | POA: Insufficient documentation

## 2017-10-30 DIAGNOSIS — I10 Essential (primary) hypertension: Secondary | ICD-10-CM | POA: Insufficient documentation

## 2017-10-30 MED ORDER — PENICILLIN V POTASSIUM 500 MG PO TABS: 500.0000 mg | ORAL_TABLET | Freq: Four times a day (QID) | ORAL | 0 refills | Status: DC

## 2017-10-30 MED ORDER — TRAMADOL HCL 50 MG PO TABS: 50.0000 mg | ORAL_TABLET | Freq: Four times a day (QID) | ORAL | 0 refills | Status: DC | PRN

## 2017-10-30 MED ORDER — IBUPROFEN 600 MG PO TABS
600.0000 mg | ORAL_TABLET | Freq: Once | ORAL | Status: AC
  Administered 2017-10-30: 600 mg via ORAL
  Filled 2017-10-30: qty 1

## 2017-10-30 NOTE — ED Notes (Signed)
See triage note  States she bit into a piece of candy  And thought she broke the entire tooth  Developed increased pain and swelling to right side of face 2 days ago

## 2017-10-30 NOTE — ED Triage Notes (Signed)
Patient presents to the ED with right face swelling x 2 days.  Patient states about 1 week ago she was eating something and her tooth "broke off".  Patient states she has not seen a dentist since then.

## 2017-10-30 NOTE — Discharge Instructions (Signed)
Begin taking Pen-Vee K 500 mg 4 times daily until completely finished.  Call your dentist to reschedule your appointment.  Take tramadol when needed for moderate to severe pain.  Do not drive or operate machinery while taking this medication.  You may also take Tylenol or ibuprofen with this medication if additional pain medication as needed.

## 2017-10-30 NOTE — ED Provider Notes (Signed)
[facility] Emergency Department Provider Note   ____________________________________________   First MD Initiated Contact with Patient 10/30/17 0825     (approximate)  I have reviewed the triage vital signs and the nursing notes.   HISTORY  Chief Complaint Dental Problem  HPI Gina Wells is a 57 y.o. female is here with complaint of dental pain to the right side of her face for the last 2 days.  Patient states that she broke her to fall off more than 2 weeks ago and scheduled a dental appointment for today.  She states that they told her if it became more she could come to the emergency department.  She has been taking over-the-counter medication without any relief.  He rates her pain as a 10/10.   Past Medical History:  Diagnosis Date  . Alcoholism (HCC)   . Anxiety   . Depression   . Fatigue   . Hx of migraine headaches   . Hypertension   . Hyperthyroidism   . Toxic multinodular goiter w/o crisis     Patient Active Problem List   Diagnosis Date Noted  . COPD (chronic obstructive pulmonary disease) (HCC) 03/08/2017  . 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 Bipolar disorder (HCC) 02/17/2016  . HBP (high blood pressure) 11/23/2015  . GERD (gastroesophageal reflux disease) 05/12/2015  . 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 Clinical depression 01/18/2015  . Dysuria-frequency syndrome 01/18/2015  . Fatigue 01/18/2015  . Encounter for general adult medical examination without abnormal findings 01/18/2015  . Cephalalgia 01/18/2015  . Mechanical and motor problems with internal organs 01/18/2015    Past Surgical History:  Procedure Laterality Date  . KIDNEY STONE SURGERY    . URETERAL STENT PLACEMENT     2002 for kidney stones  . VAGINAL HYSTERECTOMY      Prior to Admission medications   Medication Sig Start Date End Date Taking? Authorizing Provider  albuterol (PROVENTIL HFA;VENTOLIN HFA) 108 (90 Base) MCG/ACT inhaler  Inhale 1-2 puffs into the lungs every 6 (six) hours as needed for wheezing or shortness of breath. 09/14/17   Dionne Bucy, MD  Aspirin-Salicylamide-Caffeine (BC HEADACHE POWDER PO) Take 1 Package by mouth once.    [provider]  Fluticasone-Salmeterol (ADVAIR) 250-50 MCG/DOSE AEPB Inhale 1 puff into the lungs 2 (two) times daily. 09/14/17   Dionne Bucy, MD  hydrochlorothiazide (HYDRODIURIL) 25 MG tablet Take 1 tablet (25 mg total) by mouth daily. 09/14/17   Dionne Bucy, MD  lamoTRIgine (LAMICTAL) 25 MG tablet Take 2 tablets (50 mg total) by mouth at bedtime. Patient not taking: Reported on 04/04/2017 08/01/16   Janeann Forehand., MD  levothyroxine (SYNTHROID, LEVOTHROID) 75 MCG tablet Take 1 tablet (75 mcg total) by mouth daily. 09/14/17 11/13/17  Dionne Bucy, MD  penicillin v potassium (VEETID) 500 MG tablet Take 1 tablet (500 mg total) by mouth 4 (four) times daily. 10/30/17   Tommi Rumps, PA-C  traMADol (ULTRAM) 50 MG tablet Take 1 tablet (50 mg total) by mouth every 6 (six) hours as needed. 10/30/17   Tommi Rumps, PA-C    Allergies Patient has no known allergies.  Family History  Problem Relation Age of Onset  . Diabetes Mother   . Alcohol abuse Father   . Diabetes Father   . Cancer Father   . Cancer Paternal Aunt   . Alcohol abuse Brother     Social History Social History   Tobacco Use  . Smoking status: Current Every  Day Smoker    Packs/day: 0.50    Types: Cigarettes    Start date: 01/18/1981  . Smokeless tobacco: Current User  Substance Use Topics  . Alcohol use: No    Alcohol/week: 0.0 oz  . Drug use: No    Review of Systems Constitutional: No fever/chills Eyes: No visual changes. ENT: Positive dental pain. Cardiovascular: Denies chest pain. Respiratory: Denies shortness of breath. Gastrointestinal: No abdominal pain.  No nausea, no vomiting.  Neurological: Negative for  headaches ____________________________________________   PHYSICAL EXAM:  VITAL SIGNS: ED Triage Vitals  Enc Vitals Group     BP 10/30/17 0806 (!) 156/99     Pulse Rate 10/30/17 0806 77     Resp --      Temp 10/30/17 0806 98.6 F (37 C)     Temp Source 10/30/17 0806 Oral     SpO2 10/30/17 0806 98 %     Weight 10/30/17 0807 183 lb (83 kg)     Height 10/30/17 0807 5\' 4"  (1.626 m)     Head Circumference --      Peak Flow --      Pain Score 10/30/17 0806 10     Pain Loc --      Pain Edu? --      Excl. in GC? --    Constitutional: Alert and oriented. Well appearing and in no acute distress.  Tearful. Eyes: Conjunctivae are normal.  Head: Atraumatic. Nose: No congestion/rhinnorhea. Mouth/Throat: Mucous membranes are moist.  Oropharynx non-erythematous.  Right upper premolar is below the gumline with gum extremely tender to touch.  No active drainage or evidence of abscess is seen. Neck: No stridor.   Hematological/Lymphatic/Immunilogical: No cervical lymphadenopathy. Cardiovascular: Normal rate, regular rhythm. Grossly normal heart sounds.  Good peripheral circulation. Respiratory: Normal respiratory effort.  No retractions. Lungs CTAB. Musculoskeletal: Moves upper and lower extremities without any difficulty.  Normal gait was noted. Neurologic:  Normal speech and language. No gross focal neurologic deficits are appreciated. Skin:  Skin is warm, dry and intact. No rash noted. Psychiatric: Mood and affect are normal. Speech and behavior are normal.  ____________________________________________   LABS (all labs ordered are listed, but only abnormal results are displayed)  Labs Reviewed - No data to display   PROCEDURES  Procedure(s) performed: None  Procedures  Critical Care performed: No  ____________________________________________   INITIAL IMPRESSION / ASSESSMENT AND PLAN / ED COURSE  As part of my medical decision making, I reviewed the following data within  the electronic MEDICAL RECORD NUMBER Notes from prior ED visits and [place] Controlled Substance Database  Patient states that she has a dental appointment at 10 AM is still going to try make this.  She was discharged with a prescription for Pen-Vee K 500 mg 4 times daily and tramadol every 6 hours if needed for moderate to severe pain.  She is to continue taking ibuprofen or Tylenol if needed for mild to moderate pain.  Patient will reschedule her dental appointment if not able to be seen today.  ____________________________________________   FINAL CLINICAL IMPRESSION(S) / ED DIAGNOSES  Final diagnoses:  Dental abscess     ED Discharge Orders        Ordered    penicillin v potassium (VEETID) 500 MG tablet  4 times daily     10/30/17 0839    traMADol (ULTRAM) 50 MG tablet  Every 6 hours PRN     10/30/17 0839       Note:  This document  was prepared using Conservation officer, historic buildingsDragon voice recognition software and may include unintentional dictation errors.    Tommi RumpsSummers, Rhonda L, PA-C 10/30/17 [id number]    Minna AntisPaduchowski, Kevin, MD 10/30/17 1517

## 2018-02-10 ENCOUNTER — Other Ambulatory Visit: Payer: Self-pay

## 2018-02-10 ENCOUNTER — Emergency Department: Payer: No Typology Code available for payment source

## 2018-02-10 ENCOUNTER — Emergency Department
Admission: EM | Admit: 2018-02-10 | Discharge: 2018-02-10 | Disposition: A | Discharge status: Home / Self Care | Payer: No Typology Code available for payment source | Attending: Student in an Organized Health Care Education/Training Program | Admitting: Student in an Organized Health Care Education/Training Program

## 2018-02-10 DIAGNOSIS — R51 Headache: Secondary | ICD-10-CM | POA: Insufficient documentation

## 2018-02-10 DIAGNOSIS — J449 Chronic obstructive pulmonary disease, unspecified: Secondary | ICD-10-CM | POA: Insufficient documentation

## 2018-02-10 DIAGNOSIS — F17210 Nicotine dependence, cigarettes, uncomplicated: Secondary | ICD-10-CM | POA: Insufficient documentation

## 2018-02-10 DIAGNOSIS — I10 Essential (primary) hypertension: Secondary | ICD-10-CM | POA: Insufficient documentation

## 2018-02-10 DIAGNOSIS — Z79899 Other long term (current) drug therapy: Secondary | ICD-10-CM | POA: Insufficient documentation

## 2018-02-10 DIAGNOSIS — R519 Headache, unspecified: Secondary | ICD-10-CM

## 2018-02-10 MED ORDER — DIPHENHYDRAMINE HCL 50 MG/ML IJ SOLN
12.5000 mg | Freq: Once | INTRAMUSCULAR | Status: AC
  Administered 2018-02-10: 12.5 mg via INTRAVENOUS
  Filled 2018-02-10: qty 1

## 2018-02-10 MED ORDER — PROCHLORPERAZINE EDISYLATE 10 MG/2ML IJ SOLN
10.0000 mg | Freq: Once | INTRAMUSCULAR | Status: AC
  Administered 2018-02-10: 10 mg via INTRAVENOUS
  Filled 2018-02-10: qty 2

## 2018-02-10 MED ORDER — ACETAMINOPHEN 500 MG PO TABS
1000.0000 mg | ORAL_TABLET | Freq: Once | ORAL | Status: AC
  Administered 2018-02-10: 1000 mg via ORAL
  Filled 2018-02-10: qty 2

## 2018-02-10 MED ORDER — SODIUM CHLORIDE 0.9 % IV BOLUS
1000.0000 mL | Freq: Once | INTRAVENOUS | Status: AC
  Administered 2018-02-10: 1000 mL via INTRAVENOUS

## 2018-02-10 NOTE — ED Notes (Signed)
No acute distress noted at discharge. Pt ambulatory to lobby with spouse. Pt refused wheelchair to lobby.

## 2018-02-10 NOTE — ED Triage Notes (Signed)
Reports headache for 3 weeks.  States was able to take over the counter meds and it would ease the pain.  Over the past 3 days pain not responding to over the counter meds.

## 2018-02-10 NOTE — ED Provider Notes (Signed)
[facility] Emergency Department Provider Note    First MD Initiated Contact with Patient 02/10/18 [phone]     (approximate)  I have reviewed the triage vital signs and the nursing notes.   HISTORY  Chief Complaint Headache    HPI Gina Wells is a 57 y.o. female with a history of migraine headaches anxiety depression presents to the ER with chief complaint of progressively worsening headache over the past 3 weeks.  Worse than previous migraines.  States his right frontal.  States that she is having intermittent bilateral hand and foot tingling the last roughly 10 minutes at a time.  Was not sudden onset.  No fevers.  No neck stiffness.  Does have significant family history of migraine headaches.  Has been taking over-the-counter pain medications without any relief.    Past Medical History:  Diagnosis Date  . Alcoholism (HCC)   . Anxiety   . Depression   . Fatigue   . Hx of migraine headaches   . Hypertension   . Hyperthyroidism   . 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 Diabetes Mother   . Alcohol abuse Father   . Diabetes Father   . Cancer Father   . Cancer Paternal Aunt   . Alcohol abuse Brother    Past Surgical History:  Procedure Laterality Date  . KIDNEY STONE SURGERY    . URETERAL STENT PLACEMENT     2002 for kidney stones  . VAGINAL HYSTERECTOMY     Patient Active Problem List   Diagnosis Date Noted  . COPD (chronic obstructive pulmonary disease) (HCC) 03/08/2017  . 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 Bipolar disorder (HCC) 02/17/2016  . HBP (high blood pressure) 11/23/2015  . GERD (gastroesophageal reflux disease) 05/12/2015  . 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 Clinical depression 01/18/2015  . Dysuria-frequency syndrome 01/18/2015  . Fatigue 01/18/2015  . Encounter for general adult medical examination without abnormal findings 01/18/2015  . Cephalalgia  01/18/2015  . Mechanical and motor problems with internal organs 01/18/2015      Prior to Admission medications   Medication Sig Start Date End Date Taking? Authorizing Provider  albuterol (PROVENTIL HFA;VENTOLIN HFA) 108 (90 Base) MCG/ACT inhaler Inhale 1-2 puffs into the lungs every 6 (six) hours as needed for wheezing or shortness of breath. 09/14/17   Dionne Bucy, MD  Aspirin-Salicylamide-Caffeine (BC HEADACHE POWDER PO) Take 1 Package by mouth once.    [provider]  Fluticasone-Salmeterol (ADVAIR) 250-50 MCG/DOSE AEPB Inhale 1 puff into the lungs 2 (two) times daily. 09/14/17   Dionne Bucy, MD  hydrochlorothiazide (HYDRODIURIL) 25 MG tablet Take 1 tablet (25 mg total) by mouth daily. 09/14/17   Dionne Bucy, MD  lamoTRIgine (LAMICTAL) 25 MG tablet Take 2 tablets (50 mg total) by mouth at bedtime. Patient not taking: Reported on 04/04/2017 08/01/16   Janeann Forehand., MD  levothyroxine (SYNTHROID, LEVOTHROID) 75 MCG tablet Take 1 tablet (75 mcg total) by mouth daily. 09/14/17 11/13/17  Dionne Bucy, MD  penicillin v potassium (VEETID) 500 MG tablet Take 1 tablet (500 mg total) by mouth 4 (four) times daily. 10/30/17   Tommi Rumps, PA-C  traMADol (ULTRAM) 50 MG tablet Take 1 tablet (50 mg total) by mouth every 6 (six) hours as needed. 10/30/17   Tommi Rumps, PA-C    Allergies Patient has no known allergies.    Social History Social History  Tobacco Use  . Smoking status: Current Every Day Smoker    Packs/day: 0.50    Types: Cigarettes    Start date: 01/18/1981  . Smokeless tobacco: Current User  Substance Use Topics  . Alcohol use: No    Alcohol/week: 0.0 oz  . Drug use: No    Review of Systems Patient denies headaches, rhinorrhea, blurry vision, numbness, shortness of breath, chest pain, edema, cough, abdominal pain, nausea, vomiting, diarrhea, dysuria, fevers, rashes or hallucinations unless otherwise stated above in  HPI. ____________________________________________   PHYSICAL EXAM:  VITAL SIGNS: Vitals:   02/10/18 0645  BP: (!) 155/89  Pulse: 72  Resp: 20  Temp: 97.9 F (36.6 C)  SpO2: 98%    Constitutional: Alert and oriented.  Eyes: Conjunctivae are normal.  Head: Atraumatic. Nose: No congestion/rhinnorhea. Mouth/Throat: Mucous membranes are moist.   Neck: No stridor. Painless ROM.  Cardiovascular: Normal rate, regular rhythm. Grossly normal heart sounds.  Good peripheral circulation. Respiratory: Normal respiratory effort.  No retractions. Lungs CTAB. Gastrointestinal: Soft and nontender. No distention. No abdominal bruits. No CVA tenderness. Genitourinary:  Musculoskeletal: No lower extremity tenderness nor edema.  No joint effusions. Neurologic:  Normal speech and language. No gross focal neurologic deficits are appreciated. No facial droop Skin:  Skin is warm, dry and intact. No rash noted. Psychiatric: Mood and affect are normal. Speech and behavior are normal.  ____________________________________________   LABS (all labs ordered are listed, but only abnormal results are displayed)  No results found for this or any previous visit (from the past 24 hour(s)). ________________________________________________________________________  RADIOLOGY  I personally reviewed all radiographic images ordered to evaluate for the above acute complaints and reviewed radiology reports and findings.  These findings were personally discussed with the patient.  Please see medical record for radiology report.  ____________________________________________   PROCEDURES  Procedure(s) performed:  Procedures    Critical Care performed: no ____________________________________________   INITIAL IMPRESSION / ASSESSMENT AND PLAN / ED COURSE  Pertinent labs & imaging results that were available during my care of the patient were reviewed by me and considered in my medical decision making (see  chart for details).   DDX: migraine, tension, sinusitis, unlikely meningitis, mass, sah  Gina Wells is a 57 y.o. who presents to the ED with with Hx of migrains p/w HA for last 3 weeks. Not worst HA ever. Gradual onset. H Denies focal neurologic symptoms on exam. Denies trauma. No fevers or neck pain. No vision loss. Afebrile in ED. VSS. Exam as above. No meningeal signs. No CN, motor, sensory or cerebellar deficits. Temporal arteries palpable and non-tender. Appears well and non-toxic.  Will provide IV fluids for hydration and IV medications for symptom control.  CT head ordered to eval for mass as headache is different from previous and give duration of symptomatology want to rule out mass ocupying lesion.  Likely tension, non-specific or possible migraine HA. Clinical picture is not consistent with ICH, SAH, SDH, EDH, TIA, or CVA. No concern for meningitis or encephalitis. No concern for GCA/Temporal arteritis.  Pain improved. Repeat neuro exam is again without focal deficit, nuchal rigidity or evidence of meningeal irritation.  Stable to D/C home, follow up with PCP or Neurology if persistent recurrent Has.  Have discussed with the patient and available family all diagnostics and treatments performed thus far and all questions were answered to the best of my ability. The patient demonstrates understanding and agreement with plan.        As part  of my medical decision making, I reviewed the following data within the electronic MEDICAL RECORD NUMBER Nursing notes reviewed and incorporated, Labs reviewed, notes from prior ED visits.   ____________________________________________   FINAL CLINICAL IMPRESSION(S) / ED DIAGNOSES  Final diagnoses:  Bad headache      NEW MEDICATIONS STARTED DURING THIS VISIT:  New Prescriptions   No medications on file     Note:  This document was prepared using Dragon voice recognition software and may include unintentional dictation errors.     Willy Eddyobinson, Dimas Scheck, MD 02/10/18 [phone]

## 2018-02-10 NOTE — Discharge Instructions (Signed)

## 2018-05-19 ENCOUNTER — Emergency Department (HOSPITAL_COMMUNITY): Payer: Self-pay

## 2018-05-19 ENCOUNTER — Observation Stay (HOSPITAL_COMMUNITY)
Admission: EM | Admit: 2018-05-19 | Discharge: 2018-05-20 | Disposition: A | Discharge status: Home / Self Care | Payer: Self-pay | Attending: Internal Medicine | Admitting: Internal Medicine

## 2018-05-19 ENCOUNTER — Encounter (HOSPITAL_COMMUNITY): Payer: Self-pay | Admitting: Radiology

## 2018-05-19 ENCOUNTER — Observation Stay (HOSPITAL_COMMUNITY): Payer: Self-pay

## 2018-05-19 ENCOUNTER — Other Ambulatory Visit: Payer: Self-pay

## 2018-05-19 DIAGNOSIS — K5900 Constipation, unspecified: Secondary | ICD-10-CM | POA: Insufficient documentation

## 2018-05-19 DIAGNOSIS — F319 Bipolar disorder, unspecified: Secondary | ICD-10-CM | POA: Insufficient documentation

## 2018-05-19 DIAGNOSIS — Z79899 Other long term (current) drug therapy: Secondary | ICD-10-CM | POA: Insufficient documentation

## 2018-05-19 DIAGNOSIS — J449 Chronic obstructive pulmonary disease, unspecified: Secondary | ICD-10-CM | POA: Insufficient documentation

## 2018-05-19 DIAGNOSIS — I10 Essential (primary) hypertension: Secondary | ICD-10-CM | POA: Insufficient documentation

## 2018-05-19 DIAGNOSIS — R4182 Altered mental status, unspecified: Secondary | ICD-10-CM | POA: Insufficient documentation

## 2018-05-19 DIAGNOSIS — R404 Transient alteration of awareness: Secondary | ICD-10-CM | POA: Diagnosis present

## 2018-05-19 DIAGNOSIS — E876 Hypokalemia: Secondary | ICD-10-CM

## 2018-05-19 DIAGNOSIS — E038 Other specified hypothyroidism: Secondary | ICD-10-CM

## 2018-05-19 DIAGNOSIS — F419 Anxiety disorder, unspecified: Secondary | ICD-10-CM | POA: Insufficient documentation

## 2018-05-19 DIAGNOSIS — R4789 Other speech disturbances: Secondary | ICD-10-CM

## 2018-05-19 DIAGNOSIS — R109 Unspecified abdominal pain: Secondary | ICD-10-CM | POA: Insufficient documentation

## 2018-05-19 DIAGNOSIS — E872 Acidosis: Secondary | ICD-10-CM

## 2018-05-19 DIAGNOSIS — Z87891 Personal history of nicotine dependence: Secondary | ICD-10-CM

## 2018-05-19 DIAGNOSIS — R51 Headache: Secondary | ICD-10-CM

## 2018-05-19 DIAGNOSIS — Z9114 Patient's other noncompliance with medication regimen: Secondary | ICD-10-CM

## 2018-05-19 DIAGNOSIS — I4581 Long QT syndrome: Secondary | ICD-10-CM

## 2018-05-19 DIAGNOSIS — Z79890 Hormone replacement therapy (postmenopausal): Secondary | ICD-10-CM | POA: Insufficient documentation

## 2018-05-19 DIAGNOSIS — Z7951 Long term (current) use of inhaled steroids: Secondary | ICD-10-CM

## 2018-05-19 DIAGNOSIS — E890 Postprocedural hypothyroidism: Principal | ICD-10-CM | POA: Insufficient documentation

## 2018-05-19 DIAGNOSIS — R531 Weakness: Secondary | ICD-10-CM | POA: Insufficient documentation

## 2018-05-19 DIAGNOSIS — E039 Hypothyroidism, unspecified: Secondary | ICD-10-CM

## 2018-05-19 LAB — RAPID URINE DRUG SCREEN, HOSP PERFORMED
AMPHETAMINES: NOT DETECTED
BENZODIAZEPINES: NOT DETECTED
Barbiturates: NOT DETECTED
COCAINE: NOT DETECTED
Opiates: NOT DETECTED
Tetrahydrocannabinol: NOT DETECTED

## 2018-05-19 LAB — COMPREHENSIVE METABOLIC PANEL
ALT: 48 U/L — ABNORMAL HIGH (ref 0–44)
AST: 53 U/L — AB (ref 15–41)
Albumin: 3.5 g/dL (ref 3.5–5.0)
Alkaline Phosphatase: 139 U/L — ABNORMAL HIGH (ref 38–126)
Anion gap: 12 (ref 5–15)
BUN: 18 mg/dL (ref 6–20)
CHLORIDE: 107 mmol/L (ref 98–111)
CO2: 20 mmol/L — AB (ref 22–32)
CREATININE: 1.03 mg/dL — AB (ref 0.44–1.00)
Calcium: 9.5 mg/dL (ref 8.9–10.3)
GFR calc Af Amer: >60 mL/min (ref >60)
GFR calc non Af Amer: 60 mL/min — ABNORMAL LOW (ref >60)
Glucose, Bld: 116 mg/dL — ABNORMAL HIGH (ref 70–99)
Potassium: 3.4 mmol/L — ABNORMAL LOW (ref 3.5–5.1)
SODIUM: 139 mmol/L (ref 135–145)
Total Bilirubin: 0.4 mg/dL (ref 0.3–1.2)
Total Protein: 6.2 g/dL — ABNORMAL LOW (ref 6.5–8.1)

## 2018-05-19 LAB — PROTIME-INR
INR: 0.91
Prothrombin Time: 12.2 seconds (ref 11.4–15.2)

## 2018-05-19 LAB — PHOSPHORUS: Phosphorus: 3.6 mg/dL (ref 2.5–4.6)

## 2018-05-19 LAB — URINALYSIS, ROUTINE W REFLEX MICROSCOPIC
Bacteria, UA: NONE SEEN
Bilirubin Urine: NEGATIVE
GLUCOSE, UA: NEGATIVE mg/dL
Hgb urine dipstick: NEGATIVE
KETONES UR: NEGATIVE mg/dL
Nitrite: NEGATIVE
PH: 5 (ref 5.0–8.0)
Protein, ur: NEGATIVE mg/dL
SPECIFIC GRAVITY, URINE: 1.044 — AB (ref 1.005–1.030)

## 2018-05-19 LAB — I-STAT CHEM 8, ED
BUN: 21 mg/dL — ABNORMAL HIGH (ref 6–20)
CREATININE: 1 mg/dL (ref 0.44–1.00)
Calcium, Ion: 1.3 mmol/L (ref 1.15–1.40)
Chloride: 107 mmol/L (ref 98–111)
Glucose, Bld: 114 mg/dL — ABNORMAL HIGH (ref 70–99)
HEMATOCRIT: 42 % (ref 36.0–46.0)
Hemoglobin: 14.3 g/dL (ref 12.0–15.0)
POTASSIUM: 3.6 mmol/L (ref 3.5–5.1)
SODIUM: 143 mmol/L (ref 135–145)
TCO2: 26 mmol/L (ref 22–32)

## 2018-05-19 LAB — CBC
HCT: 42.9 % (ref 36.0–46.0)
HEMOGLOBIN: 13.4 g/dL (ref 12.0–15.0)
MCH: 29.1 pg (ref 26.0–34.0)
MCHC: 31.2 g/dL (ref 30.0–36.0)
MCV: 93.1 fL (ref 80.0–100.0)
Platelets: 198 10*3/uL (ref 150–400)
RBC: 4.61 MIL/uL (ref 3.87–5.11)
RDW: 14.6 % (ref 11.5–15.5)
WBC: 14 10*3/uL — AB (ref 4.0–10.5)
nRBC: 0 % (ref 0.0–0.2)

## 2018-05-19 LAB — I-STAT TROPONIN, ED: Troponin i, poc: 0.01 ng/mL (ref 0.00–0.08)

## 2018-05-19 LAB — DIFFERENTIAL
ABS IMMATURE GRANULOCYTES: 0.05 10*3/uL (ref 0.00–0.07)
BASOS ABS: 0.1 10*3/uL (ref 0.0–0.1)
BASOS PCT: 0 %
Eosinophils Absolute: 0.1 10*3/uL (ref 0.0–0.5)
Eosinophils Relative: 1 %
IMMATURE GRANULOCYTES: 0 %
Lymphocytes Relative: 35 %
Lymphs Abs: 4.9 10*3/uL — ABNORMAL HIGH (ref 0.7–4.0)
Monocytes Absolute: 0.7 10*3/uL (ref 0.1–1.0)
Monocytes Relative: 5 %
NEUTROS ABS: 8.1 10*3/uL — AB (ref 1.7–7.7)
NEUTROS PCT: 59 %

## 2018-05-19 LAB — CORTISOL: Cortisol, Plasma: 6.3 ug/dL

## 2018-05-19 LAB — MAGNESIUM: Magnesium: 2 mg/dL (ref 1.7–2.4)

## 2018-05-19 LAB — APTT: aPTT: 23 seconds — ABNORMAL LOW (ref 24–36)

## 2018-05-19 LAB — TSH: TSH: 54.224 u[IU]/mL — ABNORMAL HIGH (ref 0.350–4.500)

## 2018-05-19 LAB — LIPID PANEL
Cholesterol: 224 mg/dL — ABNORMAL HIGH (ref 0–200)
HDL: 71 mg/dL (ref >40)
LDL CALC: 144 mg/dL — AB (ref 0–99)
Total CHOL/HDL Ratio: 3.2 RATIO
Triglycerides: 45 mg/dL (ref <150)
VLDL: 9 mg/dL (ref 0–40)

## 2018-05-19 LAB — VITAMIN B12: Vitamin B-12: 564 pg/mL (ref 180–914)

## 2018-05-19 LAB — CBG MONITORING, ED: Glucose-Capillary: 114 mg/dL — ABNORMAL HIGH (ref 70–99)

## 2018-05-19 LAB — ETHANOL

## 2018-05-19 LAB — T4, FREE: FREE T4: 0.27 ng/dL — AB (ref 0.82–1.77)

## 2018-05-19 MED ORDER — LIOTHYRONINE SODIUM 5 MCG PO TABS: 2.5000 ug | ORAL_TABLET | Freq: Once | ORAL | Status: DC

## 2018-05-19 MED ORDER — LEVOTHYROXINE SODIUM 100 MCG IV SOLR: 150.0000 ug | Freq: Once | INTRAVENOUS | Status: DC

## 2018-05-19 MED ORDER — FOLIC ACID 1 MG PO TABS
1.0000 mg | ORAL_TABLET | Freq: Every day | ORAL | Status: DC
  Filled 2018-05-19 (×2): qty 1

## 2018-05-19 MED ORDER — FLUTICASONE FUROATE-VILANTEROL 200-25 MCG/INH IN AEPB
1.0000 | INHALATION_SPRAY | Freq: Every day | RESPIRATORY_TRACT | Status: DC
  Administered 2018-05-20: 1 via RESPIRATORY_TRACT
  Filled 2018-05-19: qty 28

## 2018-05-19 MED ORDER — ADULT MULTIVITAMIN W/MINERALS CH
1.0000 | ORAL_TABLET | Freq: Every day | ORAL | Status: DC
  Filled 2018-05-19 (×2): qty 1

## 2018-05-19 MED ORDER — IOPAMIDOL (ISOVUE-370) INJECTION 76%
50.0000 mL | Freq: Once | INTRAVENOUS | Status: AC | PRN
  Administered 2018-05-19: 50 mL via INTRAVENOUS

## 2018-05-19 MED ORDER — LEVOTHYROXINE SODIUM 100 MCG IV SOLR
100.0000 ug | Freq: Every day | INTRAVENOUS | Status: DC
  Administered 2018-05-19 – 2018-05-20 (×2): 100 ug via INTRAVENOUS
  Filled 2018-05-19 (×2): qty 5

## 2018-05-19 MED ORDER — POTASSIUM CHLORIDE CRYS ER 20 MEQ PO TBCR
40.0000 meq | EXTENDED_RELEASE_TABLET | Freq: Once | ORAL | Status: AC
  Administered 2018-05-19: 40 meq via ORAL
  Filled 2018-05-19: qty 2

## 2018-05-19 MED ORDER — ALBUTEROL SULFATE (2.5 MG/3ML) 0.083% IN NEBU: 2.5000 mg | INHALATION_SOLUTION | Freq: Four times a day (QID) | RESPIRATORY_TRACT | Status: DC | PRN

## 2018-05-19 MED ORDER — ASPIRIN 81 MG PO CHEW
324.0000 mg | CHEWABLE_TABLET | Freq: Once | ORAL | Status: AC
  Administered 2018-05-19: 324 mg via ORAL
  Filled 2018-05-19: qty 4

## 2018-05-19 MED ORDER — ACETAMINOPHEN 325 MG PO TABS
650.0000 mg | ORAL_TABLET | Freq: Four times a day (QID) | ORAL | Status: DC | PRN
  Administered 2018-05-20: 650 mg via ORAL
  Filled 2018-05-19: qty 2

## 2018-05-19 MED ORDER — VITAMIN B-1 100 MG PO TABS
100.0000 mg | ORAL_TABLET | Freq: Every day | ORAL | Status: DC
  Filled 2018-05-19 (×2): qty 1

## 2018-05-19 MED ORDER — ACETAMINOPHEN 650 MG RE SUPP: 650.0000 mg | Freq: Four times a day (QID) | RECTAL | Status: DC | PRN

## 2018-05-19 MED ORDER — POLYETHYLENE GLYCOL 3350 17 G PO PACK
17.0000 g | PACK | Freq: Every day | ORAL | Status: DC
  Filled 2018-05-19 (×2): qty 1

## 2018-05-19 MED ORDER — HYDROCORTISONE NA SUCCINATE PF 100 MG IJ SOLR
100.0000 mg | Freq: Three times a day (TID) | INTRAMUSCULAR | Status: DC
  Administered 2018-05-19 – 2018-05-20 (×2): 100 mg via INTRAVENOUS
  Filled 2018-05-19 (×2): qty 2

## 2018-05-19 MED ORDER — ENOXAPARIN SODIUM 40 MG/0.4ML ~~LOC~~ SOLN
40.0000 mg | SUBCUTANEOUS | Status: DC
  Filled 2018-05-19 (×2): qty 0.4

## 2018-05-19 NOTE — Consult Note (Addendum)
Neurology Consultation  Reason for Consult: Code stroke/ seizure suspicion  Referring Physician: Dr. Ethelda Chick   CC: Upper and lower ext weakness, blank stare, confusion   History is obtained from: EMS, Chart review, Significant other via phone and daughter who gave permission for information retrieved from significant other.  HPI: GENEVIA BOULDIN is a 57 y.o. female with a past medical history of migraines, hypertension, hyperthyroid disease (s/p thyroidectomy).  She presented today via EMS for possible stroke and r/o seizure with loss of attention, generalized weakness t/o impaired speech, and confusion.  Per EMS and clarified with Charlottes significant other and daughter her last known well with this morning at approximately 10 AM.  The significant other states that she called out saying that she needed a cold compress.  She had been complaining of abdominal upset this morning.  Not shortly after he noticed that she started to have seizure-like activity.  He describes this episode as her upper body shaking, a blank stare, and inability to follow commands.  He states that he attempted to get her attention, she would not answer but continued looking forward.  Timing of this episode is unknown.  He denies that there was any tongue biting, we cannot say if bowel or bladder was loss as she was sitting on the toilet at the time.  EMS arrived and states that she was unable to move any of her extremities, and was unable to answer questions or comply with the assessment.  Her family denies any history of seizure, nor does she have a family history of seizure, nor has she recently had any head trauma, nor does she have a history of stroke or childhood febrile seizures.  It is also known at this time if she intakes and the use of drugs or alcohol.  Was assessed by the neurology team in the ER there with notice of some left gaze preference, and eventually extremity movement and response throughout with  noted left side weakness which subsequently resolved.  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  She remained dazed, confused for some time with a postictal like presentation. Not shortly after she was following commands.  CT brain without contrast was negative for acute abnormalities or stroke there was no signs of hemorrhage.  CTA head and neck vessel studies were negative for large vessel occlusion.  There was  only minimal head and neck atherosclerosis, but there is mild to moderate irregularity of the left PCA with mild left P2 segment stenosis.  LKW: 10 am tpa given?: no, likely seizure, symptoms were transient in nature Premorbid modified Rankin scale (mRS): 1 0-Completely asymptomatic and back to baseline post-stroke 1-No significant post stroke disability and can perform usual duties with stroke symptoms 2-Slight disability-UNABLE to perform all activities but does not need assistance  3-Moderate disability-requires help but walks WITHOUT assistance 4-Needs assistance to walk and tend to bodily needs 5-Severe disability-bedridden, incontinent, needs constant attention 6- Death   ROS: A 14 point ROS was performed and is negative except as noted in the  Past Medical History:  Diagnosis Date  . Alcoholism (HCC)   . Anxiety   . Depression   . Fatigue   . Hx of migraine headaches   . Hypertension   . Hyperthyroidism   . 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 Diabetes Mother   . Alcohol abuse Father   . Diabetes Father   .  Cancer Father   . Cancer Paternal Aunt   . Alcohol abuse Brother     Social History:   reports that she has been smoking cigarettes. She started smoking about 37 years ago. She has been smoking about 0.50 packs per day. She uses smokeless tobacco. She reports that she does not drink alcohol or use drugs.  Medications No current facility-administered medications  for this encounter.   Current Outpatient Medications:  .  albuterol (PROVENTIL HFA;VENTOLIN HFA) 108 (90 Base) MCG/ACT inhaler, Inhale 1-2 puffs into the lungs every 6 (six) hours as needed for wheezing or shortness of breath., Disp: 1 Inhaler, Rfl: 1 .  Aspirin-Salicylamide-Caffeine (BC HEADACHE POWDER PO), Take 1 Package by mouth once., Disp: , Rfl:  .  Fluticasone-Salmeterol (ADVAIR) 250-50 MCG/DOSE AEPB, Inhale 1 puff into the lungs 2 (two) times daily., Disp: 60 each, Rfl: 1 .  hydrochlorothiazide (HYDRODIURIL) 25 MG tablet, Take 1 tablet (25 mg total) by mouth daily., Disp: 30 tablet, Rfl: 1 .  lamoTRIgine (LAMICTAL) 25 MG tablet, Take 2 tablets (50 mg total) by mouth at bedtime. (Patient not taking: Reported on 04/04/2017), Disp: 60 tablet, Rfl: 0 .  levothyroxine (SYNTHROID, LEVOTHROID) 75 MCG tablet, Take 1 tablet (75 mcg total) by mouth daily., Disp: 30 tablet, Rfl: 1 .  penicillin v potassium (VEETID) 500 MG tablet, Take 1 tablet (500 mg total) by mouth 4 (four) times daily., Disp: 40 tablet, Rfl: 0 .  traMADol (ULTRAM) 50 MG tablet, Take 1 tablet (50 mg total) by mouth every 6 (six) hours as needed., Disp: 15 tablet, Rfl: 0   Exam: Current vital signs: BP 129/84   Pulse 68   Temp 97.9 F (36.6 C) (Oral)   Resp 20   SpO2 95%  Vital signs in last 24 hours: Temp:  [97.9 F (36.6 C)] 97.9 F (36.6 C) (10/27 1152) Pulse Rate:  [68-73] 68 (10/27 1200) Resp:  [18-20] 20 (10/27 1200) BP: (129-185)/(84-101) 129/84 (10/27 1200) SpO2:  [95 %-98 %] 95 % (10/27 1200)  GENERAL: Originally with post ictal appearance  HEENT: - Normocephalic and atraumatic, dry mm, no LN++, no Thyromegally LUNGS - Clear to auscultation bilaterally with no wheezes CV - S1S2 RRR, no m/r/g, equal pulses bilaterally. ABDOMEN - Soft, nontender, nondistended with normoactive BS Ext: warm, well perfused, intact peripheral pulses, 0 edema  NEURO:  Mental Status: Not awakening oriented to self, unaware of  situation, apparently came back to baseline able to answer questions Language: speech originally with mild dysarthria, but was clear on naming items fluency and comprehension later improved Cranial Nerves: PERRL . EOMI,  visual fields full, no facial asymmetry, sensation intact now, original presentation right lower extremity with with sensory decline and extinction. Motor: Moving all extremities now with generalized weakness.  Original presentation with right lower extremity drift, left upper extremity drift.  Seeming to move towards baseline as assessment continues Tone: is normal and bulk is normal-original tone seemed slightly rigid on the right   Labs I have reviewed labs in epic and the results pertinent to this consultation are: WBC concerning for leukocytosis 14.0, previous results evident for hyperlipidemia and hypercholesterolemia  CBC    Component Value Date/Time   WBC 14.0 (H) 05/19/2018 1116   RBC 4.61 05/19/2018 1116   HGB 14.3 05/19/2018 1120   HGB 13.0 03/08/2017 1909   HCT 42.0 05/19/2018 1120   HCT 39.8 03/08/2017 1909   PLT 198 05/19/2018 1116   PLT 271 03/08/2017 1909   MCV 93.1 05/19/2018  1116   MCV 89 03/08/2017 1909   MCH 29.1 05/19/2018 1116   MCHC 31.2 05/19/2018 1116   RDW 14.6 05/19/2018 1116   RDW 14.4 03/08/2017 1909   LYMPHSABS 4.9 (H) 05/19/2018 1116   LYMPHSABS 3.2 (H) 01/19/2015 0808   MONOABS 0.7 05/19/2018 1116   EOSABS 0.1 05/19/2018 1116   EOSABS 0.1 01/19/2015 0808   BASOSABS 0.1 05/19/2018 1116   BASOSABS 0.0 01/19/2015 0808    CMP     Component Value Date/Time   NA 143 05/19/2018 1120   NA 140 03/08/2017 1909   K 3.6 05/19/2018 1120   CL 107 05/19/2018 1120   CO2 20 (L) 05/19/2018 1116   GLUCOSE 114 (H) 05/19/2018 1120   BUN 21 (H) 05/19/2018 1120   BUN 10 03/08/2017 1909   CREATININE 1.00 05/19/2018 1120   CALCIUM 9.5 05/19/2018 1116   PROT 6.2 (L) 05/19/2018 1116   PROT 6.7 03/08/2017 1909   ALBUMIN 3.5 05/19/2018 1116    ALBUMIN 4.3 03/08/2017 1909   AST 53 (H) 05/19/2018 1116   ALT 48 (H) 05/19/2018 1116   ALKPHOS 139 (H) 05/19/2018 1116   BILITOT 0.4 05/19/2018 1116   BILITOT 0.4 03/08/2017 1909   GFRNONAA 60 (L) 05/19/2018 1116   GFRAA >60 05/19/2018 1116    Lipid Panel     Component Value Date/Time   CHOL 233 (H) 03/08/2017 1909   TRIG 101 03/08/2017 1909   HDL 62 03/08/2017 1909   CHOLHDL 3.8 03/08/2017 1909   LDLCALC 151 (H) 03/08/2017 1909     Imaging I have reviewed the images obtained:  CT- angio head and neck  1. Negative for large vessel occlusion. 2. Only minimal head and neck atherosclerosis, but there is mild to moderate irregularity of the left PCA with mild left P2 segment stenosis. 3. Bilateral ICA Fibromuscular Dysplasia (FMD) is suspected. 4. Retained or aspirated secretions in the subglottic trachea. Otherwise negative neck and visible upper chest.  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  Initial presentation with confusion, left gaze deviation right side weakness suspicious for Todd's paralysis, and all symptoms characteristic of seizure.  Along with a witness statement from her significant other of the episode.  Impression:Fortunata D Ruotolo is a 57 y.o. female with a past medical history of migraines, hypertension, hyperthyroid disease (s/p thyroidectomy).  Presented to the ER this morning via EMS with concern for stroke-presented with left gaze and right-sided weakness which improved in the CT scanner.  Impression Transient ischemic attack versus seizures    Recommendations: -MRI brain without contrast negative for acute stroke -Toxicology screen-rule out any drug use, or contributing factors to seizure-like activity -Metabolic and medical work-up.  Patient with complaint of abdomen pain and  loose stools may possibly want to review CT of abdomen for potential causes -EEG to evaluate for seizure activity.     NEUROHOSPITALIST ADDENDUM Performed a face to face diagnostic evaluation.   I have reviewed the contents of history and physical exam as documented by PA/ARNP/Resident and agree with above documentation.  I have discussed and formulated the above plan as documented. Edits to the note have been made as needed.  Patient with history of thyroidectomy, migraines, hypertension presents to the emergency room after being found  by her boyfriend in the bathroom. Unresponsive and not following commands.  EMS was called  who felt patient was not moving any extremity, however on arrival patient was weak on the right side and had a gaze preference towards the left side concerning for left MCA stroke.  In the CT scanner patient started to gradually improve, eventually returning to her baseline about 5 to 10 minutes.  Her symptoms are concerning for seizure with postictal Todd's versus a TIA.  Stat MRI was negative for acute stroke.  Patient being admitted for TIA work-up also would recommend EEG.  We will hold off starting her on seizure medication for now given that she has had no seizures in the past.   Georgiana Spinner Deondrea Markos MD [facility] [phone]   If 7pm to 7am, please call on call as listed on AMION.

## 2018-05-19 NOTE — Code Documentation (Signed)
57 year old female presents to [facility] via [place] as code stroke which was called in the field.  EMS reports patients boyfriend states she was LSW at 1000 - then she went into the bathroom and called him at 32 - he states she was shaking in her upper body and would not talk - EMS was notified.  On arrival here she was met at the bridge by stroke team and ED staff.  She is alert -slow to respond - weak in all extremities - not talking -does focus - had left gaze preference.  Dr. Lorraine Lax at the bedside.   CT scan and CTA were completed.  Prescan her NIHHS was 14 - post scan she is more alert - she is moving all extremities - answers questions appropriately - follows commands - speaking clearly although slow - she recalls the event as "being sick" with her stomach hurting and have diarrhea.  NIHSS now 0.  No acute treatment - Dr. Lorraine Lax concerned for seizure.  Handoff to [place] RN.

## 2018-05-19 NOTE — ED Notes (Signed)
Admitting at bedside 

## 2018-05-19 NOTE — ED Provider Notes (Signed)
[facility] EMERGENCY DEPARTMENT Provider Note   CSN: [id number] Arrival date & time: 05/19/18  1114 Seen on arrival  5 caveat acuity of situation she is noncommunicative history is obtained from EMS  History   Chief Complaint No chief complaint on file.   HPI Gina Wells is a 57 y.o. female.  HPI EMS called by patient's boyfriend patient was last seen normal at 10 AM today when he found her on the toilet, not responding to answers.  She was noted to be ignoring her right side.  CBG obtained by EMS was 138.  No treatment prior to coming here. Past Medical History:  Diagnosis Date  . Alcoholism (HCC)   . Anxiety   . Depression   . Fatigue   . Hx of migraine headaches   . Hypertension   . Hyperthyroidism   . Toxic multinodular goiter w/o crisis     Patient Active Problem List   Diagnosis Date Noted  . COPD (chronic obstructive pulmonary disease) (HCC) 03/08/2017  . 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 Bipolar disorder (HCC) 02/17/2016  . HBP (high blood pressure) 11/23/2015  . GERD (gastroesophageal reflux disease) 05/12/2015  . 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 Clinical depression 01/18/2015  . Dysuria-frequency syndrome 01/18/2015  . Fatigue 01/18/2015  . Encounter for general adult medical examination without abnormal findings 01/18/2015  . Cephalalgia 01/18/2015  . Mechanical and motor problems with internal organs 01/18/2015    Past Surgical History:  Procedure Laterality Date  . KIDNEY STONE SURGERY    . URETERAL STENT PLACEMENT     2002 for kidney stones  . VAGINAL HYSTERECTOMY       OB History    Gravida  1   Para      Term      Preterm      AB  1   Living  2     SAB      TAB      Ectopic      Multiple      Live Births               Home Medications    Prior to Admission medications   Medication Sig Start Date End Date Taking? Authorizing Provider  albuterol (PROVENTIL HFA;VENTOLIN  HFA) 108 (90 Base) MCG/ACT inhaler Inhale 1-2 puffs into the lungs every 6 (six) hours as needed for wheezing or shortness of breath. 09/14/17   Dionne Bucy, MD  Aspirin-Salicylamide-Caffeine (BC HEADACHE POWDER PO) Take 1 Package by mouth once.    [provider]  Fluticasone-Salmeterol (ADVAIR) 250-50 MCG/DOSE AEPB Inhale 1 puff into the lungs 2 (two) times daily. 09/14/17   Dionne Bucy, MD  hydrochlorothiazide (HYDRODIURIL) 25 MG tablet Take 1 tablet (25 mg total) by mouth daily. 09/14/17   Dionne Bucy, MD  lamoTRIgine (LAMICTAL) 25 MG tablet Take 2 tablets (50 mg total) by mouth at bedtime. Patient not taking: Reported on 04/04/2017 08/01/16   Janeann Forehand., MD  levothyroxine (SYNTHROID, LEVOTHROID) 75 MCG tablet Take 1 tablet (75 mcg total) by mouth daily. 09/14/17 11/13/17  Dionne Bucy, MD  penicillin v potassium (VEETID) 500 MG tablet Take 1 tablet (500 mg total) by mouth 4 (four) times daily. 10/30/17   Tommi Rumps, PA-C  traMADol (ULTRAM) 50 MG tablet Take 1 tablet (50 mg total) by mouth every 6 (six) hours as needed. 10/30/17   Tommi Rumps, PA-C    Family History Family History  Problem  Relation Age of Onset  . Diabetes Mother   . Alcohol abuse Father   . Diabetes Father   . Cancer Father   . Cancer Paternal Aunt   . Alcohol abuse Brother     Social History Social History   Tobacco Use  . Smoking status: Current Every Day Smoker    Packs/day: 0.50    Types: Cigarettes    Start date: 01/18/1981  . Smokeless tobacco: Current User  Substance Use Topics  . Alcohol use: No    Alcohol/week: 0.0 standard drinks  . Drug use: No     Allergies   Patient has no known allergies.   Review of Systems Review of Systems  Unable to perform ROS: Acuity of condition  Neurological: Positive for speech difficulty and weakness.        NotSpeaking and right-sided weakness     Physical Exam Updated Vital Signs There were no vitals  taken for this visit.  Physical Exam  Constitutional: She appears well-developed and well-nourished.  HENT:  Head: Normocephalic and atraumatic.  Eyes: Pupils are equal, round, and reactive to light. Conjunctivae are normal.  Neck: Neck supple. No tracheal deviation present. No thyromegaly present.  Cardiovascular: Normal rate and regular rhythm.  No murmur heard. Pulmonary/Chest: Effort normal and breath sounds normal.  Abdominal: Soft. Bowel sounds are normal. She exhibits no distension. There is no tenderness.  Musculoskeletal: Normal range of motion. She exhibits no edema or tenderness.  Neurological: She is alert. Coordination normal.  Leftward gaze.  Holding her left arm in the air.  She will not move her right arm or right leg.  Does not follow simple commands.  Does not answer questions.  Nonverbal.  Skin: Skin is warm and dry. No rash noted.  Psychiatric: She has a normal mood and affect.  Nursing note and vitals reviewed.    ED Treatments / Results  Labs (all labs ordered are listed, but only abnormal results are displayed) Labs Reviewed  PROTIME-INR  APTT  CBC  DIFFERENTIAL  COMPREHENSIVE METABOLIC PANEL  I-STAT TROPONIN, ED  CBG MONITORING, ED  I-STAT CHEM 8, ED    EKG EKG Interpretation  Date/Time:  Sunday May 19 2018 11:51:11 EDT Ventricular Rate:  69 PR Interval:    QRS Duration: 86 QT Interval:  528 QTC Calculation: 566 R Axis:   76 Text Interpretation:  Sinus rhythm Borderline T abnormalities, lateral leads Prolonged QT interval No significant change since last tracing Confirmed by Doug Sou [phone]) on 05/19/2018 12:09:14 PM   Radiology No results found.  Procedures Procedures (including critical care time)  Medications Ordered in ED Medications - No data to display  Results for orders placed or performed during the hospital encounter of 05/19/18  Protime-INR  Result Value Ref Range   Prothrombin Time 12.2 11.4 - 15.2 seconds    INR 0.91   APTT  Result Value Ref Range   aPTT 23 (L) 24 - 36 seconds  CBC  Result Value Ref Range   WBC 14.0 (H) 4.0 - 10.5 K/uL   RBC 4.61 3.87 - 5.11 MIL/uL   Hemoglobin 13.4 12.0 - 15.0 g/dL   HCT 30.8 65.7 - 84.6 %   MCV 93.1 80.0 - 100.0 fL   MCH 29.1 26.0 - 34.0 pg   MCHC 31.2 30.0 - 36.0 g/dL   RDW 96.2 95.2 - 84.1 %   Platelets 198 150 - 400 K/uL   nRBC 0.0 0.0 - 0.2 %  Differential  Result Value Ref  Range   Neutrophils Relative % 59 %   Neutro Abs 8.1 (H) 1.7 - 7.7 K/uL   Lymphocytes Relative 35 %   Lymphs Abs 4.9 (H) 0.7 - 4.0 K/uL   Monocytes Relative 5 %   Monocytes Absolute 0.7 0.1 - 1.0 K/uL   Eosinophils Relative 1 %   Eosinophils Absolute 0.1 0.0 - 0.5 K/uL   Basophils Relative 0 %   Basophils Absolute 0.1 0.0 - 0.1 K/uL   Immature Granulocytes 0 %   Abs Immature Granulocytes 0.05 0.00 - 0.07 K/uL  Comprehensive metabolic panel  Result Value Ref Range   Sodium 139 135 - 145 mmol/L   Potassium 3.4 (L) 3.5 - 5.1 mmol/L   Chloride 107 98 - 111 mmol/L   CO2 20 (L) 22 - 32 mmol/L   Glucose, Bld 116 (H) 70 - 99 mg/dL   BUN 18 6 - 20 mg/dL   Creatinine, Ser 0.98 (H) 0.44 - 1.00 mg/dL   Calcium 9.5 8.9 - 11.9 mg/dL   Total Protein 6.2 (L) 6.5 - 8.1 g/dL   Albumin 3.5 3.5 - 5.0 g/dL   AST 53 (H) 15 - 41 U/L   ALT 48 (H) 0 - 44 U/L   Alkaline Phosphatase 139 (H) 38 - 126 U/L   Total Bilirubin 0.4 0.3 - 1.2 mg/dL   GFR calc non Af Amer 60 (L) >60 mL/min   GFR calc Af Amer >60 >60 mL/min   Anion gap 12 5 - 15  I-stat troponin, ED  Result Value Ref Range   Troponin i, poc 0.01 0.00 - 0.08 ng/mL   Comment 3          I-Stat Chem 8, ED  Result Value Ref Range   Sodium 143 135 - 145 mmol/L   Potassium 3.6 3.5 - 5.1 mmol/L   Chloride 107 98 - 111 mmol/L   BUN 21 (H) 6 - 20 mg/dL   Creatinine, Ser 1.47 0.44 - 1.00 mg/dL   Glucose, Bld 829 (H) 70 - 99 mg/dL   Calcium, Ion 5.62 1.30 - 1.40 mmol/L   TCO2 26 22 - 32 mmol/L   Hemoglobin 14.3 12.0 - 15.0 g/dL    HCT 86.5 78.4 - 69.6 %   Ct Angio Head W Or Wo Contrast  Result Date: 05/19/2018 CLINICAL DATA:  57 year old female code stroke. Abnormal speech, right facial droop. EXAM: CT ANGIOGRAPHY HEAD AND NECK TECHNIQUE: Multidetector CT imaging of the head and neck was performed using the standard protocol during bolus administration of intravenous contrast. Multiplanar CT image reconstructions and MIPs were obtained to evaluate the vascular anatomy. Carotid stenosis measurements (when applicable) are obtained utilizing NASCET criteria, using the distal internal carotid diameter as the denominator. CONTRAST:  50mL ISOVUE-370 IOPAMIDOL (ISOVUE-370) INJECTION 76% COMPARISON:  Head CT without contrast 1130 hours today. FINDINGS: CTA NECK Skeleton: Dental caries on the left. No acute osseous abnormality identified. Upper chest: Mild dependent atelectasis in the upper lungs. There are retained secretions in the subglottic trachea seen from series 6, image 115 through 128. The carina and visible mainstem bronchi are patent. No superior mediastinal lymphadenopathy. Other neck: The glottis is closed. Subglottic material appears to be retained secretions. Otherwise negative neck soft tissues. Aortic arch: 3 vessel arch configuration with no significant arch atherosclerosis. Right carotid system: Negative aside from mild to moderate tortuosity. Furthermore, there is a slightly beaded appearance of the vessel seen on series 9, image 120 at the C1 level. Left carotid system: No atherosclerosis  or stenosis. Mild tortuosity. Mildly beaded appearance as seen on series 9, image 115. Vertebral arteries: Normal proximal subclavian arteries and left vertebral artery origins. The right vertebral artery is normal to the skull base and mildly dominant. Left vertebral artery is normal to the skull base. CTA HEAD Posterior circulation: Bilateral V4 segment tortuosity without stenosis. Tortuous vertebrobasilar junction. Normal right PICA  origin. The left AICA appears dominant. Tortuous basilar artery without stenosis. Normal SCA and PCA origins. Posterior communicating arteries are diminutive or absent. Right PCA branches are within normal limits. The left PCA appears mildly to moderately irregular throughout and up to mildly stenotic in the distal P2 segment on series 13, image 23. Anterior circulation: Both ICA siphons are patent with mild tortuosity. Minimal calcified plaque. No stenosis. Normal ophthalmic artery origins. Patent carotid termini. Normal MCA and ACA origins. Anterior communicating artery is within normal limits. Bilateral ACA branches are tortuous but otherwise normal. Left MCA M1 segment is mildly tortuous. Left MCA bifurcation and left MCA branches are within normal limits. Right MCA M1 segment is mildly tortuous. Right MCA bifurcation and right MCA branches are within normal limits. Venous sinuses: Patent. Anatomic variants: Dominant right vertebral artery. Review of the MIP images confirms the above findings IMPRESSION: 1. Negative for large vessel occlusion. 2. Only minimal head and neck atherosclerosis, but there is mild to moderate irregularity of the left PCA with mild left P2 segment stenosis. 3. Bilateral ICA Fibromuscular Dysplasia (FMD) is suspected. 4. Retained or aspirated secretions in the subglottic trachea. Otherwise negative neck and visible upper chest. Electronically Signed   By: Odessa Fleming M.D.   On: 05/19/2018 11:53   Ct Angio Neck W Or Wo Contrast  Result Date: 05/19/2018 CLINICAL DATA:  57 year old female code stroke. Abnormal speech, right facial droop. EXAM: CT ANGIOGRAPHY HEAD AND NECK TECHNIQUE: Multidetector CT imaging of the head and neck was performed using the standard protocol during bolus administration of intravenous contrast. Multiplanar CT image reconstructions and MIPs were obtained to evaluate the vascular anatomy. Carotid stenosis measurements (when applicable) are obtained utilizing NASCET  criteria, using the distal internal carotid diameter as the denominator. CONTRAST:  50mL ISOVUE-370 IOPAMIDOL (ISOVUE-370) INJECTION 76% COMPARISON:  Head CT without contrast 1130 hours today. FINDINGS: CTA NECK Skeleton: Dental caries on the left. No acute osseous abnormality identified. Upper chest: Mild dependent atelectasis in the upper lungs. There are retained secretions in the subglottic trachea seen from series 6, image 115 through 128. The carina and visible mainstem bronchi are patent. No superior mediastinal lymphadenopathy. Other neck: The glottis is closed. Subglottic material appears to be retained secretions. Otherwise negative neck soft tissues. Aortic arch: 3 vessel arch configuration with no significant arch atherosclerosis. Right carotid system: Negative aside from mild to moderate tortuosity. Furthermore, there is a slightly beaded appearance of the vessel seen on series 9, image 120 at the C1 level. Left carotid system: No atherosclerosis or stenosis. Mild tortuosity. Mildly beaded appearance as seen on series 9, image 115. Vertebral arteries: Normal proximal subclavian arteries and left vertebral artery origins. The right vertebral artery is normal to the skull base and mildly dominant. Left vertebral artery is normal to the skull base. CTA HEAD Posterior circulation: Bilateral V4 segment tortuosity without stenosis. Tortuous vertebrobasilar junction. Normal right PICA origin. The left AICA appears dominant. Tortuous basilar artery without stenosis. Normal SCA and PCA origins. Posterior communicating arteries are diminutive or absent. Right PCA branches are within normal limits. The left PCA appears mildly to moderately  irregular throughout and up to mildly stenotic in the distal P2 segment on series 13, image 23. Anterior circulation: Both ICA siphons are patent with mild tortuosity. Minimal calcified plaque. No stenosis. Normal ophthalmic artery origins. Patent carotid termini. Normal MCA and  ACA origins. Anterior communicating artery is within normal limits. Bilateral ACA branches are tortuous but otherwise normal. Left MCA M1 segment is mildly tortuous. Left MCA bifurcation and left MCA branches are within normal limits. Right MCA M1 segment is mildly tortuous. Right MCA bifurcation and right MCA branches are within normal limits. Venous sinuses: Patent. Anatomic variants: Dominant right vertebral artery. Review of the MIP images confirms the above findings IMPRESSION: 1. Negative for large vessel occlusion. 2. Only minimal head and neck atherosclerosis, but there is mild to moderate irregularity of the left PCA with mild left P2 segment stenosis. 3. Bilateral ICA Fibromuscular Dysplasia (FMD) is suspected. 4. Retained or aspirated secretions in the subglottic trachea. Otherwise negative neck and visible upper chest. Electronically Signed   By: Odessa Fleming M.D.   On: 05/19/2018 11:53   Ct Head Code Stroke Wo Contrast  Result Date: 05/19/2018 CLINICAL DATA:  Code stroke. 57 year old female last seen normal at 10 30 hours. Abnormal speech, right facial droop, can't move extremity with purpose. EXAM: CT HEAD WITHOUT CONTRAST TECHNIQUE: Contiguous axial images were obtained from the base of the skull through the vertex without intravenous contrast. COMPARISON:  Head CT 02/10/2018 and earlier. FINDINGS: Brain: Stable dystrophic calcifications in the basal ganglia greater on the left. No midline shift, ventriculomegaly, mass effect, evidence of mass lesion, intracranial hemorrhage or evidence of cortically based acute infarction. Gray-white matter differentiation is stable and within normal limits throughout the brain. Vascular: No suspicious intracranial vascular hyperdensity. Skull: No acute osseous abnormality identified. Sinuses/Orbits: Visualized paranasal sinuses and mastoids are stable and well pneumatized. Other: Stable orbit and scalp soft tissues. ASPECTS (Alberta Stroke Program Early CT Score) -  Ganglionic level infarction (caudate, lentiform nuclei, internal capsule, insula, M1-M3 cortex): 7 - Supraganglionic infarction (M4-M6 cortex): 3 Total score (0-10 with 10 being normal): 10 IMPRESSION: 1. Stable and normal noncontrast CT appearance of the brain. 2. ASPECTS is 10. 3. These results were communicated to Dr. Laurence Slate at 11:35 amon 05/19/2018 by text page via the [facility] messaging system. Electronically Signed   By: Odessa Fleming M.D.   On: 05/19/2018 11:35   Initial Impression / Assessment and Plan / ED Course  I have reviewed the triage vital signs and the nursing notes.  Pertinent labs & imaging results that were available during my care of the patient were reviewed by me and considered in my medical decision making (see chart for details).    12 noon patient is alert Glasgow Coma Score 15 moves all extremities well.  Meal nerves II through XII grossly intact.  DTRs symmetric bilaterally at knee jerk ankle jerk and biceps toes downgoing bilaterally.  Speech is clear .answers appropriate she has no recall of her arrival here.  She reports having had several episodes of diarrhea this morning.  She denies pain anywhere she states she feels normal except feels thirsty.  Denies alcohol or drug use.  She was called as code stroke in the field.  Dr. Laurence Slate patient in the ED and subsequently canceled code stroke .  Patient passed swallow screen.  Aspirin ordered.  I consulted Dr. Larey Brick from medical service who will arrange for overnight stay  PtWith transient altered mental status Differential diagnosis includes TIA versus seizure. Remarkable  for mild hypokalemia, mildly elevated transaminases.  Oral potassium supplementation ordered. Final Clinical Impressions(s) / ED Diagnoses  #1 altered mental status #2 hypokalemia #3 diarrhea Final diagnoses:  None  CRITICAL CARE Performed by: Doug Sou Total critical care time: 30 minutes Critical care time was exclusive of separately billable  procedures and treating other patients. Critical care was necessary to treat or prevent imminent or life-threatening deterioration. Critical care was time spent personally by me on the following activities: development of treatment plan with patient and/or surrogate as well as nursing, discussions with consultants, evaluation of patient's response to treatment, examination of patient, obtaining history from patient or surrogate, ordering and performing treatments and interventions, ordering and review of laboratory studies, ordering and review of radiographic studies, pulse oximetry and re-evaluation of patient's condition.  ED Discharge Orders    None       Doug Sou, MD 05/19/18 1310

## 2018-05-19 NOTE — H&P (Addendum)
Date: 05/19/2018               Patient Name:  Gina Wells MRN: [id number]  DOB: 1961/01/09 Age / Sex: 57 y.o., female   PCP: Janeann Forehand., MD         Medical Service: Internal Medicine Teaching Service         Attending Physician: Dr. Gust Rung, DO    First Contact: Dr. Teola Bradley Pager: [phone]  Second Contact: Dr. Chana Bode Pager: [phone]       After Hours (After 5p/  First Contact Pager: [phone]  weekends / holidays): Second Contact Pager: [phone]   Chief Complaint: Altered mental status  History of Present Illness: Gina Wells is a 57 y.o female with PMHx. Of HTN, chronic headacghe, COPD, hypothyroidism (2/2 toxic multinodular goiter thyroid ablation), Depression and anxiety, bipolar disorder, brought to ED due to altered mental status. Patient was found by her boy friend at 10 AM this morning, sitting on toilet complaining of abdominal pain, asking him to call 911. She then had shaking movement on her upper extremities and facial drop at her right side. She was staring with no response. Patient remembers that she had some diarrhea and abdominal pain, today, went to bath room in the AM,  went to bath room, and then developed some sweating and called her boyfriend. She does not remember what happened after that. She denies any SOB, chest pain. No palpitation. No fever. She says that she felt weak generally. She was at normal level of health 2 days ago She denies any drug use, any new medications. Her last meal was last night. BG at EMS arrival was 183. She underwent thyroid ablation for multi goiter nodules, but has not taken any thyroid medications after that due to not being insured.   On ED, she was unresponsive, did not follow commands, found to hold her left arm in the air. K was 3.4 and mild NAGMA.  Meds:  No outpatient medications have been marked as taking for the 05/19/18 encounter [facility] Encounter).     Allergies: Allergies  as of 05/19/2018  . (No Known Allergies)   Past Medical History:  Diagnosis Date  . Alcoholism (HCC)   . Anxiety   . Depression   . Fatigue   . Hx of migraine headaches   . Hypertension   . Hyperthyroidism   . Toxic multinodular goiter w/o crisis     Family History:  Alcohol abuse in father DM in father DM in mother Cancer in father  Social History:  Smoking: Alcohol use Illicit drug use   Review of Systems: A complete ROS was negative except as per HPI.   Physical Exam: Blood pressure (!) 149/84, pulse 65, temperature 97.9 F (36.6 C), temperature source Oral, resp. rate 14, SpO2 99 %. Physical Exam  Constitutional: She is oriented to person, place, and time. She appears well-developed and well-nourished. No distress.  HENT:  Head: Normocephalic and atraumatic.  Eyes: EOM are normal.  Neck: Normal range of motion. Neck supple.  Cardiovascular: Normal rate, regular rhythm, normal heart sounds and intact distal pulses.  No murmur heard. Pulmonary/Chest: Effort normal and breath sounds normal. No respiratory distress. She has no wheezes. She has no rales.  Abdominal: Soft. Bowel sounds are normal. She exhibits no distension. There is no tenderness. There is no guarding.  Musculoskeletal: She exhibits no edema.  Neurological: She is alert and oriented to person, place, and time. She  has normal strength. She displays no tremor. No cranial nerve deficit or sensory deficit. She exhibits normal muscle tone.  Skin: Is cold on distal extremities. She is not diaphoretic.  Psychiatric: Her behavior is normal. Judgment and thought content normal. Her affect is blunt. Speech is delayed Cognition and memory are normal.   EKG: personally reviewed my interpretation is sinus rhythm. Normal rate. Has prolonged QT   Assessment & Plan by Problem: Active Problems:   * No active hospital problems. *  Ms. Urquilla is a 57 y.o female with PMHx. Of HTN, chronic headacghe, COPD,  uncontrolled hypothyroidism (2/2 toxic multinodular goiter thyroid ablation), Depression and anxiety, bipolar disorder, brought to ED due to altered mental status. Transient aletered mental status and : Patient prsented with altered mental status, unresponsiveness and staring gaze. Also with holding her right are in the air. Patient was stable. Had table and normal noncontrast CT appearance of the brain. Ct angio head and neck showed: 1. Negative for large vessel occlusion. 2. Only minimal head and neck atherosclerosis, but there is mild to moderate irregularity of the left PCA with mild left P2 segment stenosis. 3. Bilateral ICA Fibromuscular Dysplasia (FMD) is suspected. 4. Retained or aspirated secretions in the subglottic trachea. Otherwise negative neck and visible upper chest. Code stroke cancelled by neurologist as patient gained her full consciousness. And came back to her normal status. Now Concerning for TIA vs seizure seizure vs uncontrolled/untreated hypothyroidism  TIA vs seizure (with decreased threshold due to untreated hypothyroidism)   - ASA 325 mg once - EEG - ASA after swallow evaluation - f/u toxicology and drug screen test - Ethanol level - TSH, free T4 and T3 level staus - B12 level - Cortisol - Thiamine tab 100 mg QD - Folic acis -Cardiac monitoring  Hypokaemia and mild NAGMA likely 2/2 diarrhea. Diarrhea resolved. S/p K-dur 40 meq QD -Repeat BMP   Hypothyroidism 2/2 radioactive thyroid ablation: Uncontrolled. Last TSH on 08/2017 was 82 On no meds since January. Can also play a role in part of patient's presentation and decreasing seizure threshold  -TSH, free T4 and T3  COPD: stable -C/w home dose of Albuterol, Breo-Elipta -Keep O2 sat>94% -Continous pulse oxymetry   HTN: BP: 149/84 -Hold HCTZ for now   Diet: NPO IV fluid: VTE ppx: Lovenox Code status: Full  Dispo: Admit patient to Observation with expected length of stay less than 2  midnights.  SignedChevis Pretty, MD 05/19/2018, 2:45 PM  Pager: [phone]

## 2018-05-19 NOTE — ED Notes (Signed)
Pt and daughter aware diet has been ordered for pt and she may eat. Pt noted to be talking on phone - nad. Monitor intact to pt.

## 2018-05-19 NOTE — ED Notes (Signed)
Pt and daughter states pt was able to drink water w/o difficulty during initial Stroke Swallow Screen. 2nd Screen performed - passed. Admitting MD aware - assessed pt - ordered diet.

## 2018-05-19 NOTE — ED Triage Notes (Signed)
Pt here as a codes stroke lsn 1000 found on toilet  By boyfriend , right side flaccid on arrival ,

## 2018-05-19 NOTE — ED Notes (Signed)
Patient transported to MRI 

## 2018-05-19 NOTE — ED Notes (Signed)
Pt refused po meds - states she did not need Miralax d/t has been experiencing diarrhea. States she does not need vitamins. Monitor remains intact to pt. Family at bedside. Aware of delay w/meal trays.

## 2018-05-20 ENCOUNTER — Observation Stay (HOSPITAL_COMMUNITY): Payer: Self-pay

## 2018-05-20 ENCOUNTER — Ambulatory Visit (HOSPITAL_COMMUNITY): Payer: Self-pay

## 2018-05-20 ENCOUNTER — Telehealth: Payer: Self-pay | Admitting: Pharmacy Technician

## 2018-05-20 DIAGNOSIS — G8929 Other chronic pain: Secondary | ICD-10-CM

## 2018-05-20 DIAGNOSIS — L84 Corns and callosities: Secondary | ICD-10-CM

## 2018-05-20 DIAGNOSIS — D72829 Elevated white blood cell count, unspecified: Secondary | ICD-10-CM

## 2018-05-20 DIAGNOSIS — M79673 Pain in unspecified foot: Secondary | ICD-10-CM

## 2018-05-20 DIAGNOSIS — E038 Other specified hypothyroidism: Secondary | ICD-10-CM

## 2018-05-20 DIAGNOSIS — Z79890 Hormone replacement therapy (postmenopausal): Secondary | ICD-10-CM

## 2018-05-20 LAB — COMPREHENSIVE METABOLIC PANEL
ALBUMIN: 3.5 g/dL (ref 3.5–5.0)
ALK PHOS: 106 U/L (ref 38–126)
ALT: 39 U/L (ref 0–44)
AST: 23 U/L (ref 15–41)
Anion gap: 5 (ref 5–15)
BUN: 16 mg/dL (ref 6–20)
CALCIUM: 9.4 mg/dL (ref 8.9–10.3)
CO2: 24 mmol/L (ref 22–32)
CREATININE: 1.08 mg/dL — AB (ref 0.44–1.00)
Chloride: 111 mmol/L (ref 98–111)
GFR calc Af Amer: >60 mL/min (ref >60)
GFR calc non Af Amer: 56 mL/min — ABNORMAL LOW (ref >60)
Glucose, Bld: 173 mg/dL — ABNORMAL HIGH (ref 70–99)
Potassium: 3.9 mmol/L (ref 3.5–5.1)
SODIUM: 140 mmol/L (ref 135–145)
Total Bilirubin: 0.6 mg/dL (ref 0.3–1.2)
Total Protein: 6.4 g/dL — ABNORMAL LOW (ref 6.5–8.1)

## 2018-05-20 LAB — GLUCOSE, CAPILLARY
GLUCOSE-CAPILLARY: 132 mg/dL — AB (ref 70–99)
GLUCOSE-CAPILLARY: 163 mg/dL — AB (ref 70–99)

## 2018-05-20 LAB — HEMOGLOBIN A1C
Hgb A1c MFr Bld: 6.5 % — ABNORMAL HIGH (ref 4.8–5.6)
Mean Plasma Glucose: 139.85 mg/dL

## 2018-05-20 LAB — CBC
HCT: 41.7 % (ref 36.0–46.0)
Hemoglobin: 13.3 g/dL (ref 12.0–15.0)
MCH: 29.5 pg (ref 26.0–34.0)
MCHC: 31.9 g/dL (ref 30.0–36.0)
MCV: 92.5 fL (ref 80.0–100.0)
PLATELETS: 239 10*3/uL (ref 150–400)
RBC: 4.51 MIL/uL (ref 3.87–5.11)
RDW: 14.7 % (ref 11.5–15.5)
WBC: 13.9 10*3/uL — ABNORMAL HIGH (ref 4.0–10.5)
nRBC: 0 % (ref 0.0–0.2)

## 2018-05-20 LAB — T3: T3, Total: 61 ng/dL — ABNORMAL LOW (ref 71–180)

## 2018-05-20 LAB — HIV ANTIBODY (ROUTINE TESTING W REFLEX): HIV Screen 4th Generation wRfx: NONREACTIVE

## 2018-05-20 MED ORDER — FLUTICASONE-SALMETEROL 250-50 MCG/DOSE IN AEPB: 1.0000 | INHALATION_SPRAY | Freq: Two times a day (BID) | RESPIRATORY_TRACT | 1 refills | Status: DC

## 2018-05-20 MED ORDER — HYDROCHLOROTHIAZIDE 25 MG PO TABS: 25.0000 mg | ORAL_TABLET | Freq: Every day | ORAL | 0 refills | Status: DC

## 2018-05-20 MED ORDER — LEVOTHYROXINE SODIUM 100 MCG PO TABS: 100.0000 ug | ORAL_TABLET | Freq: Every day | ORAL | 0 refills | Status: DC

## 2018-05-20 NOTE — CV Procedure (Signed)
Echocardiogram was not performed per patients request. Patient states she did not have a stroke, and doesn't need the echocardiogram. This was our second and final attempt.   Gina Wells [facility]  10/28 2:12 PM

## 2018-05-20 NOTE — Progress Notes (Signed)
Pt refused EEG at this time. Stated she would like to do the EEG at a later date when her insurance kicks in.

## 2018-05-20 NOTE — Progress Notes (Signed)
  Echocardiogram 2D Echocardiogram has been attempted. Patient refused exam.  Gina Wells G [name] 05/20/2018, 8:08 AM

## 2018-05-20 NOTE — Progress Notes (Signed)
   Subjective: Patient was seen and evaluated at bedside on morning rounds. No acute events overnight.  Has some cough, also some pain at bottom of her right foot. No other acute complaints.  Objective:  Vital signs in last 24 hours: Vitals:   05/19/18 1800 05/19/18 1804 05/19/18 2115 05/20/18 0401  BP: 118/70 118/70 137/85 132/72  Pulse: 70 71 72 74  Resp: 19 20 20 18   Temp:  98.1 F (36.7 C) 98.3 F (36.8 C) 98 F (36.7 C)  TempSrc:  Oral Oral Oral  SpO2: 95% 96% 97% 95%  Weight:   79.3 kg   Height:   5\' 4"  (1.626 m)    Physical Exam  Constitutional: She is oriented to person, place, and time. She appears well-developed and well-nourished. No distress.  HENT:  Head: Normocephalic and atraumatic.  Eyes: EOM are normal.  Cardiovascular: Normal rate, regular rhythm, normal heart sounds and intact distal pulses.  No murmur heard. Pulmonary/Chest: Effort normal and breath sounds normal. No stridor. No respiratory distress. She has no wheezes. She has no rales.  Abdominal: Soft. Bowel sounds are normal. She exhibits no distension. There is no tenderness.  Musculoskeletal: Normal range of motion. Callus formation at plantar aspect of left foot Neurological: She is alert and oriented to person, place, and time.  Skin: Skin is dry. No rash noted. She is not diaphoretic.  Psychiatric: Her affect is blunt. Her speech is not delayed.   Assessment/Plan:  Active Problems:   Severe hypothyroidism   Altered level of consciousness  Ms. Ashton is a 57 y.o female with PMHx. Of HTN, chronic headacghe, COPD, uncontrolled hypothyroidism (2/2 toxic multinodular goiter thyroid ablation), Depression and anxiety, bipolar disorder, brought to ED due to altered mental status.  Possible seizure likely 2/2 sever hypothyroidism CT head and Brain MRI unremarkable Has been alert and oriented x3 No neurologic deficit on exam Toxicology and drug screen test came back negative Ethanol level was  normal  B12 level normal  - DC with seizure precautions and follow up outpatient  Hypokaemia and mild NAGMA likely 2/2 diarrhea. Diarrhea resolved. Resolved K today:3.9  Hypothyroidism 2/2 radioactive thyroid ablation: Uncontrolled. Last TSH on 08/2017 was 82 On no meds since January. Can also play a role in part of patient's presentation and decreasing seizure threshold TSH:54 free T4: 0.27  -Discharge with 100 mcg Levothyroxine QD and follow up out patient within a month  Foot pain: Chronic. Callus lesion/wart on exam -Follow up out patient if not improved in 2-3 weeks  COPD: stable -C/w home dose of Albuterol, Breo-Elipta -Keep O2 sat>94% -Continous pulse oxymetry  Leucocytosis: Asymptomatic. Likely 2/2 Steroid therapyyesterday  HTN: BP: 149/84 -Resume HCTZ at home  Diet: PO IV fluid:None VTE ppx: Lovenox Code status: Full  Dispo: Discharge today  Chevis Pretty, MD 05/20/2018, 6:06 AM Pager: [phone]

## 2018-05-20 NOTE — Progress Notes (Signed)
Pt given discharge instructions, prescriptions, and care notes. Pt verbalized understanding AEB no further questions or concerns at this time. IV was discontinued, no redness, pain, or swelling noted at this time. Telemetry discontinued and Centralized Telemetry was notified. Pt left the floor via wheelchair with staff in stable condition. 

## 2018-05-20 NOTE — Progress Notes (Addendum)
STROKE TEAM PROGRESS NOTE   INTERVAL HISTORY No family is at the bedside.  Dr. Pearlean Brownie told her not to drive x 6 months. He also called and spoke with the intern. Recommends EEG but she has no insurance and wants to go home.   Vitals:   05/19/18 1804 05/19/18 2115 05/20/18 0401 05/20/18 0849  BP: 118/70 137/85 132/72   Pulse: 71 72 74 75  Resp: 20 20 18 18   Temp: 98.1 F (36.7 C) 98.3 F (36.8 C) 98 F (36.7 C)   TempSrc: Oral Oral Oral   SpO2: 96% 97% 95% 91%  Weight:  79.3 kg    Height:  5\' 4"  (1.626 m)      CBC:  Recent Labs  Lab 05/19/18 1116 05/19/18 1120 05/20/18 0536  WBC 14.0*  --  13.9*  NEUTROABS 8.1*  --   --   HGB 13.4 14.3 13.3  HCT 42.9 42.0 41.7  MCV 93.1  --  92.5  PLT 198  --  239    Basic Metabolic Panel:  Recent Labs  Lab 05/19/18 1116 05/19/18 1120 05/19/18 1549 05/20/18 0536  NA 139 143  --  140  K 3.4* 3.6  --  3.9  CL 107 107  --  111  CO2 20*  --   --  24  GLUCOSE 116* 114*  --  173*  BUN 18 21*  --  16  CREATININE 1.03* 1.00  --  1.08*  CALCIUM 9.5  --   --  9.4  MG  --   --  2.0  --   PHOS  --   --  3.6  --    Lipid Panel:     Component Value Date/Time   CHOL 224 (H) 05/19/2018 1549   CHOL 233 (H) 03/08/2017 1909   TRIG 45 05/19/2018 1549   HDL 71 05/19/2018 1549   HDL 62 03/08/2017 1909   CHOLHDL 3.2 05/19/2018 1549   VLDL 9 05/19/2018 1549   LDLCALC 144 (H) 05/19/2018 1549   LDLCALC 151 (H) 03/08/2017 1909   HgbA1c:  Lab Results  Component Value Date   HGBA1C 6.5 (H) 05/20/2018   Urine Drug Screen:     Component Value Date/Time   LABOPIA NONE DETECTED 05/19/2018 1544   COCAINSCRNUR NONE DETECTED 05/19/2018 1544   LABBENZ NONE DETECTED 05/19/2018 1544   AMPHETMU NONE DETECTED 05/19/2018 1544   THCU NONE DETECTED 05/19/2018 1544   LABBARB NONE DETECTED 05/19/2018 1544    Alcohol Level     Component Value Date/Time   ETH <10 05/19/2018 1715    IMAGING Ct Angio Head W Or Wo Contrast  Result Date:  05/19/2018 CLINICAL DATA:  57 year old female code stroke. Abnormal speech, right facial droop. EXAM: CT ANGIOGRAPHY HEAD AND NECK TECHNIQUE: Multidetector CT imaging of the head and neck was performed using the standard protocol during bolus administration of intravenous contrast. Multiplanar CT image reconstructions and MIPs were obtained to evaluate the vascular anatomy. Carotid stenosis measurements (when applicable) are obtained utilizing NASCET criteria, using the distal internal carotid diameter as the denominator. CONTRAST:  50mL ISOVUE-370 IOPAMIDOL (ISOVUE-370) INJECTION 76% COMPARISON:  Head CT without contrast 1130 hours today. FINDINGS: CTA NECK Skeleton: Dental caries on the left. No acute osseous abnormality identified. Upper chest: Mild dependent atelectasis in the upper lungs. There are retained secretions in the subglottic trachea seen from series 6, image 115 through 128. The carina and visible mainstem bronchi are patent. No superior mediastinal lymphadenopathy. Other neck: The  glottis is closed. Subglottic material appears to be retained secretions. Otherwise negative neck soft tissues. Aortic arch: 3 vessel arch configuration with no significant arch atherosclerosis. Right carotid system: Negative aside from mild to moderate tortuosity. Furthermore, there is a slightly beaded appearance of the vessel seen on series 9, image 120 at the C1 level. Left carotid system: No atherosclerosis or stenosis. Mild tortuosity. Mildly beaded appearance as seen on series 9, image 115. Vertebral arteries: Normal proximal subclavian arteries and left vertebral artery origins. The right vertebral artery is normal to the skull base and mildly dominant. Left vertebral artery is normal to the skull base. CTA HEAD Posterior circulation: Bilateral V4 segment tortuosity without stenosis. Tortuous vertebrobasilar junction. Normal right PICA origin. The left AICA appears dominant. Tortuous basilar artery without  stenosis. Normal SCA and PCA origins. Posterior communicating arteries are diminutive or absent. Right PCA branches are within normal limits. The left PCA appears mildly to moderately irregular throughout and up to mildly stenotic in the distal P2 segment on series 13, image 23. Anterior circulation: Both ICA siphons are patent with mild tortuosity. Minimal calcified plaque. No stenosis. Normal ophthalmic artery origins. Patent carotid termini. Normal MCA and ACA origins. Anterior communicating artery is within normal limits. Bilateral ACA branches are tortuous but otherwise normal. Left MCA M1 segment is mildly tortuous. Left MCA bifurcation and left MCA branches are within normal limits. Right MCA M1 segment is mildly tortuous. Right MCA bifurcation and right MCA branches are within normal limits. Venous sinuses: Patent. Anatomic variants: Dominant right vertebral artery. Review of the MIP images confirms the above findings IMPRESSION: 1. Negative for large vessel occlusion. 2. Only minimal head and neck atherosclerosis, but there is mild to moderate irregularity of the left PCA with mild left P2 segment stenosis. 3. Bilateral ICA Fibromuscular Dysplasia (FMD) is suspected. 4. Retained or aspirated secretions in the subglottic trachea. Otherwise negative neck and visible upper chest. Electronically Signed   By: Odessa Fleming M.D.   On: 05/19/2018 11:53   Ct Angio Neck W Or Wo Contrast  Result Date: 05/19/2018 CLINICAL DATA:  57 year old female code stroke. Abnormal speech, right facial droop. EXAM: CT ANGIOGRAPHY HEAD AND NECK TECHNIQUE: Multidetector CT imaging of the head and neck was performed using the standard protocol during bolus administration of intravenous contrast. Multiplanar CT image reconstructions and MIPs were obtained to evaluate the vascular anatomy. Carotid stenosis measurements (when applicable) are obtained utilizing NASCET criteria, using the distal internal carotid diameter as the  denominator. CONTRAST:  50mL ISOVUE-370 IOPAMIDOL (ISOVUE-370) INJECTION 76% COMPARISON:  Head CT without contrast 1130 hours today. FINDINGS: CTA NECK Skeleton: Dental caries on the left. No acute osseous abnormality identified. Upper chest: Mild dependent atelectasis in the upper lungs. There are retained secretions in the subglottic trachea seen from series 6, image 115 through 128. The carina and visible mainstem bronchi are patent. No superior mediastinal lymphadenopathy. Other neck: The glottis is closed. Subglottic material appears to be retained secretions. Otherwise negative neck soft tissues. Aortic arch: 3 vessel arch configuration with no significant arch atherosclerosis. Right carotid system: Negative aside from mild to moderate tortuosity. Furthermore, there is a slightly beaded appearance of the vessel seen on series 9, image 120 at the C1 level. Left carotid system: No atherosclerosis or stenosis. Mild tortuosity. Mildly beaded appearance as seen on series 9, image 115. Vertebral arteries: Normal proximal subclavian arteries and left vertebral artery origins. The right vertebral artery is normal to the skull base and mildly dominant. Left  vertebral artery is normal to the skull base. CTA HEAD Posterior circulation: Bilateral V4 segment tortuosity without stenosis. Tortuous vertebrobasilar junction. Normal right PICA origin. The left AICA appears dominant. Tortuous basilar artery without stenosis. Normal SCA and PCA origins. Posterior communicating arteries are diminutive or absent. Right PCA branches are within normal limits. The left PCA appears mildly to moderately irregular throughout and up to mildly stenotic in the distal P2 segment on series 13, image 23. Anterior circulation: Both ICA siphons are patent with mild tortuosity. Minimal calcified plaque. No stenosis. Normal ophthalmic artery origins. Patent carotid termini. Normal MCA and ACA origins. Anterior communicating artery is within normal  limits. Bilateral ACA branches are tortuous but otherwise normal. Left MCA M1 segment is mildly tortuous. Left MCA bifurcation and left MCA branches are within normal limits. Right MCA M1 segment is mildly tortuous. Right MCA bifurcation and right MCA branches are within normal limits. Venous sinuses: Patent. Anatomic variants: Dominant right vertebral artery. Review of the MIP images confirms the above findings IMPRESSION: 1. Negative for large vessel occlusion. 2. Only minimal head and neck atherosclerosis, but there is mild to moderate irregularity of the left PCA with mild left P2 segment stenosis. 3. Bilateral ICA Fibromuscular Dysplasia (FMD) is suspected. 4. Retained or aspirated secretions in the subglottic trachea. Otherwise negative neck and visible upper chest. Electronically Signed   By: Odessa Fleming M.D.   On: 05/19/2018 11:53   Mr Brain Wo Contrast  Result Date: 05/19/2018 CLINICAL DATA:  Transient altered mental status. EXAM: MRI HEAD WITHOUT CONTRAST TECHNIQUE: Multiplanar, multiecho pulse sequences of the brain and surrounding structures were obtained without intravenous contrast. COMPARISON:  CTA head neck 05/19/2018 FINDINGS: BRAIN: There is no acute infarct, acute hemorrhage, hydrocephalus or extra-axial collection. The midline structures are normal. No midline shift or other mass effect. There are no old infarcts. Minimal white matter hyperintensity, nonspecific and commonly seen in asymptomatic patients of this age. The cerebral and cerebellar volume are age-appropriate. Susceptibility-sensitive sequences show no chronic microhemorrhage or superficial siderosis. The hippocampi are normal and symmetric in size and signal. The hypothalamus and mamillary bodies are normal. There is no cortical ectopia or dysplasia. VASCULAR: Major intracranial arterial and venous sinus flow voids are normal. SKULL AND UPPER CERVICAL SPINE: Calvarial bone marrow signal is normal. There is no skull base mass.  Visualized upper cervical spine and soft tissues are normal. SINUSES/ORBITS: No fluid levels or advanced mucosal thickening. No mastoid or middle ear effusion. The orbits are normal. IMPRESSION: Normal aging brain.  No seizure etiology identified. Electronically Signed   By: Deatra Robinson M.D.   On: 05/19/2018 15:48   Ct Head Code Stroke Wo Contrast  Result Date: 05/19/2018 CLINICAL DATA:  Code stroke. 57 year old female last seen normal at 10 30 hours. Abnormal speech, right facial droop, can't move extremity with purpose. EXAM: CT HEAD WITHOUT CONTRAST TECHNIQUE: Contiguous axial images were obtained from the base of the skull through the vertex without intravenous contrast. COMPARISON:  Head CT 02/10/2018 and earlier. FINDINGS: Brain: Stable dystrophic calcifications in the basal ganglia greater on the left. No midline shift, ventriculomegaly, mass effect, evidence of mass lesion, intracranial hemorrhage or evidence of cortically based acute infarction. Gray-white matter differentiation is stable and within normal limits throughout the brain. Vascular: No suspicious intracranial vascular hyperdensity. Skull: No acute osseous abnormality identified. Sinuses/Orbits: Visualized paranasal sinuses and mastoids are stable and well pneumatized. Other: Stable orbit and scalp soft tissues. ASPECTS [facility] Stroke Program Early CT Score) - Ganglionic  level infarction (caudate, lentiform nuclei, internal capsule, insula, M1-M3 cortex): 7 - Supraganglionic infarction (M4-M6 cortex): 3 Total score (0-10 with 10 being normal): 10 IMPRESSION: 1. Stable and normal noncontrast CT appearance of the brain. 2. ASPECTS is 10. 3. These results were communicated to Dr. Laurence Slate at 11:35 amon 05/19/2018 by text page via the [facility] messaging system. Electronically Signed   By: Odessa Fleming M.D.   On: 05/19/2018 11:35    PHYSICAL EXAM Resident middle-aged lady currently not in distress. . Afebrile. Head is nontraumatic. Neck is supple  without bruit.    Cardiac exam no murmur or gallop. Lungs are clear to auscultation. Distal pulses are well felt. Neurological Exam ;  Awake  Alert oriented x 3. Normal speech and language.eye movements full without nystagmus.fundi were not visualized. Vision acuity and fields appear normal. Hearing is normal. Palatal movements are normal. Face symmetric. Tongue midline. Normal strength, tone, reflexes and coordination. Normal sensation. Gait deferred.   ASSESSMENT/PLAN Ms. ADDI PAK is a 57 y.o. female with history of HTN, hypothyroidism migraines, ETOH abuse, tobacco use, depression/anxiety presenting with blank stare and confusion.   Convulsive Syncope  Code Stroke CT head Normal.  ASPECTS 10.     CTA head & neck no ELVO. minimal head and neck atherosclerosis. B ICA FMD. Secretions in subglottic trachea   MRI  Normal aging brain  2D Echo  pending   EEG recommended. Can do as an OP if cannot get done prior to d.c today - she has no insurance  LDL 144  HgbA1c 6.5  Lovenox 40 mg sq daily for VTE prophylaxis  No antithrombotic prior to admission, recieved aspirin 324 mg x 1, not on antithrombotic now; no antithrombotics recommended based on findings  Therapy recommendations:  pending   Disposition:  pending   Advised not to drive x 6 months  Will sign off. Follow up with neuro if needed  Hypertension  Stable . BP goal normotensive  Hyperlipidemia  Home meds:  No statin  LDL 144  Statin recommended  Possible Diabetes type II  HgbA1c 6.5  Other Stroke Risk Factors  Cigarette smoker, advised to stop smoking  Smokeless tobacco use  Hx ETOH abuse  Hx Illicit drug use   Obesity, Body mass index is 30.01 kg/m., recommend weight loss, diet and exercise as appropriate   Migraines  Other Active Problems  Hypokalemia and mid NAGMA d/t diarrhea  Hypothyroidism w/t radiioactive thyroid ablation  COPD  Hospital day # 0  Annie Main, MSN,  APRN, ANVP-BC, AGPCNP-BC Advanced Practice Stroke Nurse [facility] See Amion for Schedule & Pager information 05/20/2018 12:48 PM  I have personally examined this patient, reviewed notes, independently viewed imaging studies, participated in medical decision making and plan of care.ROS completed by me personally and pertinent positives fully documented  I have made any additions or clarifications directly to the above note. Agree with note above. The patient presented with what appears to be a prolonged syncopal event likely triggered by dehydration following diarrhea. Recommend EEG study but patient has no insurance and is normally not be able to afford it and is requesting discharge today. Recommend adequate hydration and to not drive for 6 months. Discussed with Dr. Mikey Bussing. Greater than 50% time during this 25 minute visit was spent on counseling and coordination of care discussion with care team and answered questions  Delia Heady, MD Medical Director [facility] Pager: [phone] 05/20/2018 6:36 PM  To contact Stroke Continuity provider,  please refer to [email or web address] After hours, contact General Neurology

## 2018-05-20 NOTE — Progress Notes (Signed)
Patient arrived to unit and transferred to bed. Identified appropriately and assessed. Vital signs stable, skin intact except for lesion on bottom of right foot that is tender to the touch. Placed on telemetry monitor and oriented to room and unit. Call bell placed within reach and pt instructed to call if needing to ambulate. Will continue to monitor and treat according to orders.

## 2018-05-20 NOTE — Progress Notes (Signed)
Pt placed on continuous pulse ox per orders. O2 sats 93% on RA. Pt in no apparent distress, and denies any chest pain. Will continue to monitor.

## 2018-05-20 NOTE — Telephone Encounter (Signed)
Patient failed to provide 2019 poi.  No additional medication assistance will be provided by [facility] without the required proof of income documentation.  Patient notified by letter.  Dossie Ocanas J. Cherise Fedder Care Manager [facility] 

## 2018-05-20 NOTE — Discharge Summary (Signed)
Name: Gina Wells MRN: [id number] DOB: 07-04-1961 57 y.o. PCP: Janeann Forehand., MD  Date of Admission: 05/19/2018 11:15 AM Date of Discharge: 05/20/2018 Attending Physician: Gust Rung, DO  Discharge Diagnosis: Principal Problem:   Severe hypothyroidism Active Problems:   Postablative hypothyroidism   Altered level of consciousness Possible seizure secondary to sever hypothyroidism  Discharge Medications: Allergies as of 05/20/2018   No Known Allergies     Medication List    STOP taking these medications   BC HEADACHE POWDER PO   lamoTRIgine 25 MG tablet Commonly known as:  LAMICTAL   penicillin v potassium 500 MG tablet Commonly known as:  VEETID   traMADol 50 MG tablet Commonly known as:  ULTRAM     TAKE these medications   albuterol 108 (90 Base) MCG/ACT inhaler Commonly known as:  PROVENTIL HFA;VENTOLIN HFA Inhale 1-2 puffs into the lungs every 6 (six) hours as needed for wheezing or shortness of breath.   Fluticasone-Salmeterol 250-50 MCG/DOSE Aepb Commonly known as:  ADVAIR Inhale 1 puff into the lungs 2 (two) times daily.   hydrochlorothiazide 25 MG tablet Commonly known as:  HYDRODIURIL Take 1 tablet (25 mg total) by mouth daily.   levothyroxine 100 MCG tablet Commonly known as:  SYNTHROID, LEVOTHROID Take 1 tablet (100 mcg total) by mouth daily. What changed:    medication strength  how much to take       Disposition and follow-up:   Gina Wells was discharged from [facility] in Stable condition.  At the hospital follow up visit please address:  1.  Patient with chronic untreated hypothyroidism (2/2toxic multinodular goiter thyroid ablation),presented with probable seizure, likely in setting of hypothyroidism. Has not taken previously prescribed Levothyroxin 75 mcg QD since January due to lack of insurance. After treating with IV levothyroxin, we discharge her with 100 mcg Levothyroxine QD  and follow up out patient within a month. Please make sure patient has access to her medication in future and is compliant. She needs a close follow up.  2. Patient will need seizure precautions and should not drive for 6 months after last seizure per [place] DMV rule. Also we recommend follow up with neurology   3. Prolonged QT on EKG. Please consider when prescribing new medications that may cause QT prolongation.  4.  Labs / imaging needed at time of follow-up: TSH, BMP, EEG  5.  Pending labs/ test needing follow-up: none  Follow-up Appointments:   Hospital Course by problem list: 1. Gina Wells is a 57 y.o female with PMHx. Of HTN,chronic headacghe, COPD,uncontrolledhypothyroidism(2/2toxic multinodular goiter thyroid ablation),Depression and anxiety, bipolardisorder, brought to ED due to altered mental status and probable seizure.  1.Possible seizure likely 2/2 sever hypothyroidism: Patient was found by her boy friend in the morning of admission, unresponsive with some shaking movement and staring. 911 was called. BG at EMS arrival was 183. When arrived to ED, Initially was unresponsive, with some left gaze and holding her left arm in the air. Code stroke initially called and then canceled by neurologist after CT head and Brain MRI were unremarkable and patient quickly regained her mental status and as had NL neurologic exam.  After admission in our service, patient had her baseline mental status completely.  With no neurological deficit.  However, on exam, she had blunted affect, delayed speech and diminished DTR.  She endorsed that she has not taken levothyroxine since last January/February due to lack of insurance and not being able  to go to clinic to have her TSH checked and refilling her medication. On further evaluation, TSH:54, free T4: 0.27 .  Toxicology and drug screen test came back negative. B12 level normal Ethanol level was normal. She received IV levothyroxine as well as  corticosteroid. Her delayed speech improved next day. No more seizure or neurologic deficit. Neurology signed off. We discussed the importance of controlling her hypothyroidism with medications. 30 days supply provided for her. And seizure precautions and [place] DMV rule explained for her. She discharge to follow up outpatient.  COPD: Patient was on albuterol, Breo-Elipta at home.  Denied any shortness of breath.  Remains asymptomatic and is stable during admission at room air. Her medications refilled at discharge.  HTN: Stable at this admission with continuing home dose od HCTZ.  Hypokaemiaand mild NAGMA:  Resolved with 40 meq K-dur.  likely 2/2 diarrhea. Diarrhea resolved.  Discharge Vitals:   BP 123/68 (BP Location: Left Arm)   Pulse 72   Temp 98.5 F (36.9 C) (Oral)   Resp 18   Ht 5\' 4"  (1.626 m)   Wt 79.3 kg   SpO2 91%   BMI 30.01 kg/m   Pertinent Labs, Studies, and Procedures:   Ct angio head and neck: 1. Negative for large vessel occlusion. 2. Only minimal head and neck atherosclerosis, but there is mild to moderate irregularity of the left PCA with mild left P2 segment stenosis. 3. Bilateral ICA Fibromuscular Dysplasia (FMD) is suspected. 4. Retained or aspirated secretions in the subglottic trachea. Otherwise negative neck and visible upper chest. TSH:54, free T4: 0.27 , T3:61 HIV: Negative Ethanol:<10 Phos: 3.6 Mg: 2 Cortisol:6.3 B12: 564  Discharge Instructions: Discharge Instructions    Call MD for:  extreme fatigue   Complete by:  As directed    Diet - low sodium heart healthy   Complete by:  As directed    Discharge instructions   Complete by:  As directed    Thanks for allowing [place] taking care of you at [facility]. You were seen due to possible seizure. Your thyroid hormone level was low on the blood test. It can increase chance of seizure and It can cause fatigue and feeling depressed. We prescribe you Levothyroxine 100 mcg daily to treat that.  Please take that regularly and follow up with your physician within a month to repeat the blood test. Please take rest of your medications as instructed. You can call us back at [phone] if having any question or concern.  Thanks, Dr. Maryla Morrow   Driving Restrictions   Complete by:  As directed    No driving for 6 months after seizure per [place] DMV.   Increase activity slowly   Complete by:  As directed       Signed: Chevis Pretty, MD 05/20/2018, 1:53 PM   Pager: [phone]

## 2018-07-11 ENCOUNTER — Telehealth: Payer: Self-pay

## 2018-07-11 ENCOUNTER — Ambulatory Visit (INDEPENDENT_AMBULATORY_CARE_PROVIDER_SITE_OTHER): Payer: Self-pay | Admitting: Nurse Practitioner

## 2018-07-11 ENCOUNTER — Other Ambulatory Visit: Payer: Self-pay

## 2018-07-11 ENCOUNTER — Encounter: Payer: Self-pay | Admitting: Nurse Practitioner

## 2018-07-11 VITALS — BP 135/74 | HR 67 | Temp 97.7°F | Ht 64.0 in | Wt 178.0 lb

## 2018-07-11 DIAGNOSIS — K219 Gastro-esophageal reflux disease without esophagitis: Secondary | ICD-10-CM

## 2018-07-11 DIAGNOSIS — I10 Essential (primary) hypertension: Secondary | ICD-10-CM

## 2018-07-11 DIAGNOSIS — B070 Plantar wart: Secondary | ICD-10-CM

## 2018-07-11 DIAGNOSIS — J449 Chronic obstructive pulmonary disease, unspecified: Secondary | ICD-10-CM

## 2018-07-11 DIAGNOSIS — E890 Postprocedural hypothyroidism: Secondary | ICD-10-CM

## 2018-07-11 MED ORDER — LEVOTHYROXINE SODIUM 75 MCG PO TABS: 75.0000 ug | ORAL_TABLET | Freq: Every day | ORAL | 3 refills | Status: DC

## 2018-07-11 MED ORDER — OMEPRAZOLE 20 MG PO CPDR: 20.0000 mg | DELAYED_RELEASE_CAPSULE | Freq: Every day | ORAL | 3 refills | Status: DC

## 2018-07-11 MED ORDER — FLUTICASONE-SALMETEROL 250-50 MCG/DOSE IN AEPB: 1.0000 | INHALATION_SPRAY | Freq: Two times a day (BID) | RESPIRATORY_TRACT | 1 refills | Status: DC

## 2018-07-11 MED ORDER — HYDROCHLOROTHIAZIDE 25 MG PO TABS: 25.0000 mg | ORAL_TABLET | Freq: Every day | ORAL | 0 refills | Status: DC

## 2018-07-11 NOTE — Telephone Encounter (Signed)
Open in error

## 2018-07-11 NOTE — Progress Notes (Signed)
Subjective:    Patient ID: Gina Wells, female    DOB: 25-Jan-1961, 57 y.o.   MRN: [id number]  Gina BoysCharlotte D Blaydes is a 57 y.o. female presenting on 07/11/2018 for Hypertension (pt states possible seizurex 2mths ago due to been off medication for several month) and Callouses (Rt foot x 2 mths)  Patient has been without insurance in last 2 years and came off all medications.  She returns now because of adverse events with seizure in October 2019.  HPI Hypertension Had started having headaches, back on meds is now improving.  Is eating lots of salt right now.  Salts food at the table.  Patient salts fast food meals as well.   Seizure 05/19/2018 Off levothyroxine and meds for so long - so restarted meds.  Could feel symptoms and changes, but had no seizure prior and none since.  Callus RIGHT foot Onset about 2 months ago.  She has used OTC wart and callus remedies without success.  This is painful and impacts her because of standing for work throughout the day.  GERD Is taking ranitidine once daily.  Gets fairly good control, but is using this chronically.  Has needed PPI in past.  Social History   Tobacco Use  . Smoking status: Current Every Day Smoker    Packs/day: 0.50    Types: Cigarettes    Start date: 01/18/1981  . Smokeless tobacco: Current User  Substance Use Topics  . Alcohol use: No    Alcohol/week: 0.0 standard drinks  . Drug use: No    Review of Systems Per HPI unless specifically indicated above      Objective:    BP 135/74 (BP Location: Right Arm, Patient Position: Sitting, Cuff Size: Normal)   Pulse 67   Temp 97.7 F (36.5 C) (Oral)   Ht 5\' 4"  (1.626 m)   Wt 178 lb (80.7 kg)   SpO2 99%   BMI 30.55 kg/m   Wt Readings from Last 3 Encounters:  07/11/18 178 lb (80.7 kg)  05/19/18 174 lb 13.2 oz (79.3 kg)  02/10/18 183 lb (83 kg)    Physical Exam Vitals signs reviewed.  Constitutional:      General: She is awake. She is not in acute  distress.    Appearance: She is well-developed.  HENT:     Head: Normocephalic and atraumatic.  Neck:     Musculoskeletal: Normal range of motion and neck supple.     Vascular: No carotid bruit.  Cardiovascular:     Rate and Rhythm: Normal rate and regular rhythm.     Pulses:          Radial pulses are 2+ on the right side and 2+ on the left side.       Posterior tibial pulses are 1+ on the right side and 1+ on the left side.     Heart sounds: Normal heart sounds, S1 normal and S2 normal.  Pulmonary:     Effort: Pulmonary effort is normal. No respiratory distress.     Breath sounds: Normal breath sounds and air entry.  Abdominal:     General: Bowel sounds are normal. There is no distension.     Palpations: Abdomen is soft.     Tenderness: There is no abdominal tenderness.     Hernia: No hernia is present.  Musculoskeletal:       Feet:  Skin:    General: Skin is warm and dry.  Neurological:     Mental Status: She  is alert and oriented to person, place, and time.  Psychiatric:        Attention and Perception: Attention normal.        Mood and Affect: Mood and affect normal.        Behavior: Behavior normal. Behavior is cooperative.     Results for orders placed or performed during the hospital encounter of 05/19/18  Protime-INR  Result Value Ref Range   Prothrombin Time 12.2 11.4 - 15.2 seconds   INR 0.91   APTT  Result Value Ref Range   aPTT 23 (L) 24 - 36 seconds  CBC  Result Value Ref Range   WBC 14.0 (H) 4.0 - 10.5 K/uL   RBC 4.61 3.87 - 5.11 MIL/uL   Hemoglobin 13.4 12.0 - 15.0 g/dL   HCT 04.542.9 40.936.0 - 81.146.0 %   MCV 93.1 80.0 - 100.0 fL   MCH 29.1 26.0 - 34.0 pg   MCHC 31.2 30.0 - 36.0 g/dL   RDW 91.414.6 78.211.5 - 95.615.5 %   Platelets 198 150 - 400 K/uL   nRBC 0.0 0.0 - 0.2 %  Differential  Result Value Ref Range   Neutrophils Relative % 59 %   Neutro Abs 8.1 (H) 1.7 - 7.7 K/uL   Lymphocytes Relative 35 %   Lymphs Abs 4.9 (H) 0.7 - 4.0 K/uL   Monocytes Relative 5  %   Monocytes Absolute 0.7 0.1 - 1.0 K/uL   Eosinophils Relative 1 %   Eosinophils Absolute 0.1 0.0 - 0.5 K/uL   Basophils Relative 0 %   Basophils Absolute 0.1 0.0 - 0.1 K/uL   Immature Granulocytes 0 %   Abs Immature Granulocytes 0.05 0.00 - 0.07 K/uL  Comprehensive metabolic panel  Result Value Ref Range   Sodium 139 135 - 145 mmol/L   Potassium 3.4 (L) 3.5 - 5.1 mmol/L   Chloride 107 98 - 111 mmol/L   CO2 20 (L) 22 - 32 mmol/L   Glucose, Bld 116 (H) 70 - 99 mg/dL   BUN 18 6 - 20 mg/dL   Creatinine, Ser 2.131.03 (H) 0.44 - 1.00 mg/dL   Calcium 9.5 8.9 - 08.610.3 mg/dL   Total Protein 6.2 (L) 6.5 - 8.1 g/dL   Albumin 3.5 3.5 - 5.0 g/dL   AST 53 (H) 15 - 41 U/L   ALT 48 (H) 0 - 44 U/L   Alkaline Phosphatase 139 (H) 38 - 126 U/L   Total Bilirubin 0.4 0.3 - 1.2 mg/dL   GFR calc non Af Amer 60 (L) >60 mL/min   GFR calc Af Amer >60 >60 mL/min   Anion gap 12 5 - 15  Urine rapid drug screen (hosp performed)  Result Value Ref Range   Opiates NONE DETECTED NONE DETECTED   Cocaine NONE DETECTED NONE DETECTED   Benzodiazepines NONE DETECTED NONE DETECTED   Amphetamines NONE DETECTED NONE DETECTED   Tetrahydrocannabinol NONE DETECTED NONE DETECTED   Barbiturates NONE DETECTED NONE DETECTED  Ethanol  Result Value Ref Range   Alcohol, Ethyl (B) <10 <10 mg/dL  Vitamin [id number]  Result Value Ref Range   Vitamin B-12 564 180 - 914 pg/mL  Urinalysis, Routine w reflex microscopic  Result Value Ref Range   Color, Urine YELLOW YELLOW   APPearance CLEAR CLEAR   Specific Gravity, Urine 1.044 (H) 1.005 - 1.030   pH 5.0 5.0 - 8.0   Glucose, UA NEGATIVE NEGATIVE mg/dL   Hgb urine dipstick NEGATIVE NEGATIVE   Bilirubin Urine NEGATIVE NEGATIVE  Ketones, ur NEGATIVE NEGATIVE mg/dL   Protein, ur NEGATIVE NEGATIVE mg/dL   Nitrite NEGATIVE NEGATIVE   Leukocytes, UA SMALL (A) NEGATIVE   RBC / HPF 0-5 0 - 5 RBC/hpf   WBC, UA 6-10 0 - 5 WBC/hpf   Bacteria, UA NONE SEEN NONE SEEN   Squamous Epithelial /  LPF 0-5 0 - 5   Mucus PRESENT   Lipid panel  Result Value Ref Range   Cholesterol 224 (H) 0 - 200 mg/dL   Triglycerides 45 <161 mg/dL   HDL 71 >09 mg/dL   Total CHOL/HDL Ratio 3.2 RATIO   VLDL 9 0 - 40 mg/dL   LDL Cholesterol 604 (H) 0 - 99 mg/dL  T4, free  Result Value Ref Range   Free T4 0.27 (L) 0.82 - 1.77 ng/dL  TSH  Result Value Ref Range   TSH 54.224 (H) 0.350 - 4.500 uIU/mL  T3  Result Value Ref Range   T3, Total 61 (L) 71 - 180 ng/dL  Cortisol  Result Value Ref Range   Cortisol, Plasma 6.3 ug/dL  HIV antibody (Routine Testing)  Result Value Ref Range   HIV Screen 4th Generation wRfx Non Reactive Non Reactive  Comprehensive metabolic panel  Result Value Ref Range   Sodium 140 135 - 145 mmol/L   Potassium 3.9 3.5 - 5.1 mmol/L   Chloride 111 98 - 111 mmol/L   CO2 24 22 - 32 mmol/L   Glucose, Bld 173 (H) 70 - 99 mg/dL   BUN 16 6 - 20 mg/dL   Creatinine, Ser 5.40 (H) 0.44 - 1.00 mg/dL   Calcium 9.4 8.9 - 98.1 mg/dL   Total Protein 6.4 (L) 6.5 - 8.1 g/dL   Albumin 3.5 3.5 - 5.0 g/dL   AST 23 15 - 41 U/L   ALT 39 0 - 44 U/L   Alkaline Phosphatase 106 38 - 126 U/L   Total Bilirubin 0.6 0.3 - 1.2 mg/dL   GFR calc non Af Amer 56 (L) >60 mL/min   GFR calc Af Amer >60 >60 mL/min   Anion gap 5 5 - 15  CBC  Result Value Ref Range   WBC 13.9 (H) 4.0 - 10.5 K/uL   RBC 4.51 3.87 - 5.11 MIL/uL   Hemoglobin 13.3 12.0 - 15.0 g/dL   HCT 19.1 47.8 - 29.5 %   MCV 92.5 80.0 - 100.0 fL   MCH 29.5 26.0 - 34.0 pg   MCHC 31.9 30.0 - 36.0 g/dL   RDW 62.1 30.8 - 65.7 %   Platelets 239 150 - 400 K/uL   nRBC 0.0 0.0 - 0.2 %  Hemoglobin A1c  Result Value Ref Range   Hgb A1c MFr Bld 6.5 (H) 4.8 - 5.6 %   Mean Plasma Glucose 139.85 mg/dL  Magnesium  Result Value Ref Range   Magnesium 2.0 1.7 - 2.4 mg/dL  Phosphorus  Result Value Ref Range   Phosphorus 3.6 2.5 - 4.6 mg/dL  Glucose, capillary  Result Value Ref Range   Glucose-Capillary 132 (H) 70 - 99 mg/dL  Glucose,  capillary  Result Value Ref Range   Glucose-Capillary 163 (H) 70 - 99 mg/dL  I-stat troponin, ED  Result Value Ref Range   Troponin i, poc 0.01 0.00 - 0.08 ng/mL   Comment 3          CBG monitoring, ED  Result Value Ref Range   Glucose-Capillary 114 (H) 70 - 99 mg/dL  I-Stat Chem 8, ED  Result Value  Ref Range   Sodium 143 135 - 145 mmol/L   Potassium 3.6 3.5 - 5.1 mmol/L   Chloride 107 98 - 111 mmol/L   BUN 21 (H) 6 - 20 mg/dL   Creatinine, Ser 1.61 0.44 - 1.00 mg/dL   Glucose, Bld 096 (H) 70 - 99 mg/dL   Calcium, Ion 0.45 4.09 - 1.40 mmol/L   TCO2 26 22 - 32 mmol/L   Hemoglobin 14.3 12.0 - 15.0 g/dL   HCT 81.1 91.4 - 78.2 %      Assessment & Plan:   Problem List Items Addressed This Visit      Cardiovascular and Mediastinum   HBP (high blood pressure) Stable and near goal control of < 130/80.  Patient restarted meds in last 2 months.  Is currently out for a couple weeks.   - Continue HCTZ - Repeat labs in 3-4 weeks - Encouraged lifestyle improvement for reducing salt and increasing physical activity - follow-up 3 mos   Relevant Medications   hydrochlorothiazide (HYDRODIURIL) 25 MG tablet     Respiratory   COPD (chronic obstructive pulmonary disease) (HCC) Unstable off medications.  Patient previously on Advair with good control.  No insurance until January.  Provided Anoro inhaler samples until Jan.  Then, resume Advair 1 puff twice daily.  Follow-up 3 months.   Relevant Medications   Fluticasone-Salmeterol (ADVAIR) 250-50 MCG/DOSE AEPB     Digestive   GERD (gastroesophageal reflux disease) Currently well controlled on ranitidine once daily, chronically.  Occasional breakthrough symptoms.  Plan: 1. STOP ranitidine chronic use. - START omeprazole 20 mg once daily. Side effects discussed. Pt wants to continue med. 2. Avoid diet triggers. Reviewed need to seek care if globus sensation, difficulty swallowing, s/sx of GI bleed. 3. Follow up as needed and in 3 mos.     Relevant Medications   omeprazole (PRILOSEC) 20 MG capsule     Endocrine   Postablative hypothyroidism Off medication and resumed about 2 weeks ago at lower dose since patient had this prescription available.  Reduced from 100 mcg from ED prescription.    Plan: 1. Continue current levothyroxine 75 mcg once daily in am for additional 3-4 weeks.  Then, return to office for labs.  May need to change dose. 2. Follow-up 3 months for OV.   Relevant Medications   levothyroxine (SYNTHROID, LEVOTHROID) 75 MCG tablet   Other Relevant Orders   TSH    Other Visit Diagnoses    Plantar wart, right foot    -  Primary Patient with painful plantar wart.  Not reduced by home remedies.   Plan: 1. Referral podiatry.  Consider excision for removal. 2. Follow-up prn.   Relevant Orders   Ambulatory referral to Podiatry      Meds ordered this encounter  Medications  . DISCONTD: hydrochlorothiazide (HYDRODIURIL) 25 MG tablet    Sig: Take 1 tablet (25 mg total) by mouth daily.    Dispense:  30 tablet    Refill:  0    Order Specific Question:   Supervising Provider    Answer:   Smitty Cords [[id number]]  . DISCONTD: levothyroxine (SYNTHROID, LEVOTHROID) 75 MCG tablet    Sig: Take 1 tablet (75 mcg total) by mouth daily.    Dispense:  30 tablet    Refill:  3    Order Specific Question:   Supervising Provider    Answer:   Smitty Cords [[id number]]  . DISCONTD: Fluticasone-Salmeterol (ADVAIR) 250-50 MCG/DOSE AEPB    Sig: Inhale  1 puff into the lungs 2 (two) times daily.    Dispense:  60 each    Refill:  1    Hold to fill once patient gets insurance in January.    Order Specific Question:   Supervising Provider    Answer:   Smitty Cords [[id number]]  . DISCONTD: omeprazole (PRILOSEC) 20 MG capsule    Sig: Take 1 capsule (20 mg total) by mouth daily.    Dispense:  30 capsule    Refill:  3    Order Specific Question:   Supervising Provider    Answer:   Smitty Cords  [[id number]]  . levothyroxine (SYNTHROID, LEVOTHROID) 75 MCG tablet    Sig: Take 1 tablet (75 mcg total) by mouth daily.    Dispense:  30 tablet    Refill:  3  . Fluticasone-Salmeterol (ADVAIR) 250-50 MCG/DOSE AEPB    Sig: Inhale 1 puff into the lungs 2 (two) times daily.    Dispense:  60 each    Refill:  1    Hold to fill once patient gets insurance in January.  . hydrochlorothiazide (HYDRODIURIL) 25 MG tablet    Sig: Take 1 tablet (25 mg total) by mouth daily.    Dispense:  30 tablet    Refill:  0  . omeprazole (PRILOSEC) 20 MG capsule    Sig: Take 1 capsule (20 mg total) by mouth daily.    Dispense:  30 capsule    Refill:  3    Follow up plan: Return in about 3 months (around 10/10/2018) for hypertension, thyroid AND in 3-4 weeks for labs.  Wilhelmina Mcardle, DNP, AGPCNP-BC Adult Gerontology Primary Care Nurse Practitioner [facility] 07/11/2018, 10:18 AM

## 2018-07-11 NOTE — Patient Instructions (Addendum)
Gina Wells,   Thank you for coming in to clinic today.  1. Continue hydrochlorothiazide and levothyroxine 75 mcg once daily.  - RECHECK labs in about 3-4 weeks in clinic - make an appointment.  2. For your COPD  - TAKE one puff daily of Anoro until you are able to get your Advair refilled.   Please schedule a follow-up appointment with Wilhelmina McardleLauren Aiko Belko, AGNP. Return in about 3 months (around 10/10/2018) for hypertension, thyroid AND in 3-4 weeks for labs.  If you have any other questions or concerns, please feel free to call the clinic or send a message through MyChart. You may also schedule an earlier appointment if necessary.  You will receive a survey after today's visit either digitally by e-mail or paper by [employer] mail. Your experiences and feedback matter to us.  Please respond so we know how we are doing as we provide care for you.   Wilhelmina McardleLauren Jaye Saal, DNP, AGNP-BC Adult Gerontology Nurse Practitioner [facility], [facility]

## 2018-07-16 ENCOUNTER — Encounter: Payer: Self-pay | Admitting: Nurse Practitioner

## 2018-07-29 ENCOUNTER — Other Ambulatory Visit: Payer: Self-pay

## 2018-07-30 ENCOUNTER — Other Ambulatory Visit: Payer: Self-pay

## 2018-07-30 LAB — TSH: TSH: 17.16 mIU/L — ABNORMAL HIGH (ref 0.40–4.50)

## 2018-07-30 MED ORDER — LEVOTHYROXINE SODIUM 100 MCG PO TABS: 100.0000 ug | ORAL_TABLET | Freq: Every day | ORAL | 1 refills | Status: DC

## 2018-07-30 NOTE — Addendum Note (Signed)
Addended by: Wilhelmina Mcardle R on: 07/30/2018 12:21 PM   Modules accepted: Orders

## 2018-07-31 ENCOUNTER — Telehealth: Payer: Self-pay | Admitting: Nurse Practitioner

## 2018-07-31 DIAGNOSIS — R569 Unspecified convulsions: Secondary | ICD-10-CM

## 2018-07-31 MED ORDER — LEVETIRACETAM 500 MG PO TABS: 500.0000 mg | ORAL_TABLET | Freq: Two times a day (BID) | ORAL | 1 refills | Status: DC

## 2018-07-31 NOTE — Telephone Encounter (Signed)
Patient reported repeated seizures between last visit.  TSH is improving.    Continue with increase of levothyroxine to 100 mcg once daily.   START keppra 500 mg bid. Referral neurology evaluate seizures.

## 2018-08-19 ENCOUNTER — Ambulatory Visit: Payer: PRIVATE HEALTH INSURANCE | Admitting: Podiatry

## 2018-10-14 ENCOUNTER — Telehealth: Payer: Self-pay | Admitting: Nurse Practitioner

## 2018-10-14 DIAGNOSIS — I10 Essential (primary) hypertension: Secondary | ICD-10-CM

## 2018-10-14 MED ORDER — HYDROCHLOROTHIAZIDE 25 MG PO TABS: 25.0000 mg | ORAL_TABLET | Freq: Every day | ORAL | 0 refills | Status: DC

## 2018-10-14 NOTE — Telephone Encounter (Signed)
Pt needs a refill on hydrochlorothiazide sent to [facility] [address] [phone]

## 2018-10-15 ENCOUNTER — Ambulatory Visit: Payer: Self-pay | Admitting: Nurse Practitioner

## 2018-11-19 ENCOUNTER — Encounter: Payer: Self-pay | Admitting: Family Medicine

## 2018-11-19 ENCOUNTER — Other Ambulatory Visit: Payer: Self-pay

## 2018-11-19 ENCOUNTER — Ambulatory Visit (INDEPENDENT_AMBULATORY_CARE_PROVIDER_SITE_OTHER): Payer: Self-pay | Admitting: Family Medicine

## 2018-11-19 DIAGNOSIS — J432 Centrilobular emphysema: Secondary | ICD-10-CM

## 2018-11-19 DIAGNOSIS — K219 Gastro-esophageal reflux disease without esophagitis: Secondary | ICD-10-CM

## 2018-11-19 DIAGNOSIS — I10 Essential (primary) hypertension: Secondary | ICD-10-CM

## 2018-11-19 MED ORDER — OMEPRAZOLE 20 MG PO CPDR: 20.0000 mg | DELAYED_RELEASE_CAPSULE | Freq: Every day | ORAL | 1 refills | Status: DC

## 2018-11-19 MED ORDER — LEVOFLOXACIN 500 MG PO TABS: 500.0000 mg | ORAL_TABLET | Freq: Every day | ORAL | 0 refills | Status: DC

## 2018-11-19 MED ORDER — PREDNISONE 50 MG PO TABS: 50.0000 mg | ORAL_TABLET | Freq: Every day | ORAL | 0 refills | Status: DC

## 2018-11-19 MED ORDER — HYDROCHLOROTHIAZIDE 25 MG PO TABS: 25.0000 mg | ORAL_TABLET | Freq: Every day | ORAL | 1 refills | Status: DC

## 2018-11-19 MED ORDER — UMECLIDINIUM-VILANTEROL 62.5-25 MCG/INH IN AEPB: 1.0000 | INHALATION_SPRAY | Freq: Every day | RESPIRATORY_TRACT | 2 refills | Status: DC

## 2018-11-19 NOTE — Progress Notes (Addendum)
Virtual Visit via Telephone The purpose of this virtual visit is to provide medical care while limiting exposure to the novel coronavirus (COVID19) for both patient and office staff.  Consent was obtained for phone visit:  Yes.   Answered questions that patient had about telehealth interaction:  Yes.   I discussed the limitations, risks, security and privacy concerns of performing an evaluation and management service by telephone. I also discussed with the patient that there may be a patient responsible charge related to this service. The patient expressed understanding and agreed to proceed.  Patient Location: Home Provider Location: [facility] (Office)  PCP is Wilhelmina Mcardle, AGPCNP-BC - I am currently covering during her maternity leave.  ---------------------------------------------------------------------- Chief Complaint  Patient presents with  . COPD    mild coughing, SOB that worsen x 2 weeks, intermittent sweating. Pt using her albuterol inhaler more frequent about 4-5 times day. Unable to walk a short distance without giving out of breath. Also unable to lay on her back with out been SOB. She admits that she doesn't use the advair, because she could not afford it.  . Hypertension    S: Reviewed CMA documentation. I have called patient and gathered additional HPI as follows:  Acute exacerbation of chronic Centrilobular Emphysema (COPD) - Last visit with PCP 06/2018, for initial visit for same problem, discussed uncontrolled COPD - had been on advair but off due to cost, treated with anoro sample since could not afford deductible for advair, see prior notes for background information. - Interval update with off daily maintenance inhaler for months now, only did 7 day course of Anoro - Today patient reports worsening breathing in general, shortness of breath, she is currently using albuterol 3-5 times a day regularly with temporary relief only, - No sick contacts  ,still smoking 0.5ppd 50 years, never seen pulmonology, has not had recent acute treatment recently Admits chest tightness pressure at times with shortness of breath, dyspnea on exertion at times  CHRONIC HTN: Reports not able to check BP currently. Current Meds - HCTZ 25 mg - needs new rx order Reports good compliance, took meds today. Tolerating well, w/o complaints. Denies CP, dyspnea, edema, dizziness / lightheadedness  GERD Previously on PPI omeprazole  daily, she was unable to pick up rx from 06/2018 and pharmacy put it back on shelf, needs re order  Patient currently at home in isolation Denies any high risk travel to areas of current concern for COVID19. Denies any known or suspected exposure to person with or possibly with COVID19.  Denies any fevers, chills, sweats, body ache, sinus pain or pressure, headache, abdominal pain, diarrhea  Social History   Tobacco Use  . Smoking status: Current Every Day Smoker    Packs/day: 0.50    Years: 50.00    Pack years: 25.00    Types: Cigarettes    Start date: 07/25/1975  . Smokeless tobacco: Never Used  Substance Use Topics  . Alcohol use: No    Alcohol/week: 0.0 standard drinks  . Drug use: No     -------------------------------------------------------------------------- O: No physical exam performed due to remote telephone encounter.  -------------------------------------------------------------------------- A&P:  Consistent with moderate subacute on chronic exacerbation of emphysema / COPD with worsening dyspnea. Similar to prior exacerbations in past - uncontrolled overall, financial barrier with cost of inhaler - Off Advair due to cost previously, no longer on maintenance - Trial of Anoro sample only for 1 week - she is unaware of cost - No longer  able to go to med management clinic since working now - they cannot provide financial ast - also now she is not working due to coronavirus  At risk for coronavirus  complication with moderate COPD  Plan: 1. Start Prednisone 50mg  x 5 day steroid burst 2. Start taking Levaquin antibiotic 500mg  daily x 7 days 3. Continue to use albuterol q 4 hr regularly x 2-3 days. 4. Order Anoro again once daily as a maintenance therapy - advised her strict instructions if not covered or high deductible plan, needs to notify us immediately we can arrange samples, of this or similar product, and will pursue financial assistance. Also we can consider nebulizer therapy option for her to reduce cost - Referral to [facility] for financial med assistance and med management - Referral to Pulmonology for further management / testing / treatment - requested [facility]  Follow-up within 1 week if not worsening, otherwise strict return criteria to go to ED   - Currently patient is LOW TO MODERATE RISK for COVID19 based on current symptoms and no known travel/exposure - however at this time, now COVID19 is currently within phase of community spread, and therefore patient can still be potentially exposed. Cannot rule out case with mild symptoms. Testing is not recommended at this time due to mild symptoms.  ------ Additionally med refills completed for GERD - restart Omeprazole 20mg  daily. Refill HCTZ for HTN - has some headaches, try to check BP regularly, follow-up as instructed  **UPDATE 11/19/18 - 1242pm** I called patient's pharmacy [address], and confirmed that cost of Anoro inhaler would be $300, as she told us on a call back to our office after her visit. I spoke with pharmacist, confirmed this is because patient did not present any insurance information to her pharmacy, she is only attempting to use a discount card. They also checked Spiriva and Symbicort all prices uninsured and with discount are $300 to $500. Pharmacy will notify patient that our office will work on financial assistance, and she can in meantime pick up her other medicine she is planning to pick up and  can afford the prednisone and levaquin and other meds.  Already referred to [facility] for financial assistance.   Meds ordered this encounter  Medications  . predniSONE (DELTASONE) 50 MG tablet    Sig: Take 1 tablet (50 mg total) by mouth daily with breakfast.    Dispense:  5 tablet    Refill:  0  . levofloxacin (LEVAQUIN) 500 MG tablet    Sig: Take 1 tablet (500 mg total) by mouth daily. For 7 days    Dispense:  7 tablet    Refill:  0  . umeclidinium-vilanterol (ANORO ELLIPTA) 62.5-25 MCG/INH AEPB    Sig: Inhale 1 puff into the lungs daily.    Dispense:  60 each    Refill:  2  . omeprazole (PRILOSEC) 20 MG capsule    Sig: Take 1 capsule (20 mg total) by mouth daily before breakfast.    Dispense:  90 capsule    Refill:  1  . hydrochlorothiazide (HYDRODIURIL) 25 MG tablet    Sig: Take 1 tablet (25 mg total) by mouth daily.    Dispense:  90 tablet    Refill:  1   Orders Placed This Encounter  Procedures  . Ambulatory referral to Pulmonology    Referral Priority:   Routine    Referral Type:   Consultation    Referral Reason:   Specialty Services Required  Requested Specialty:   Pulmonary Disease    Number of Visits Requested:   1  . Ambulatory referral to Chronic Care Management Services    Referral Priority:   Routine    Referral Type:   Consultation    Referral Reason:   Care Coordination    Number of Visits Requested:   1     OPTIONAL RECOMMENDED self quarantine for patient safety for PREVENTION ONLY. It is not required based on current clinical symptoms. If they were to develop fever or worsening shortness of breath, then emphasis on REQUIRED quarantine for up to 7-14 days that could be resolved if fever free >3 days AND if symptoms improving after 7 days.   If symptoms do not resolve or significantly improve OR if WORSENING - fever / cough - or worsening shortness of breath - then should contact us and seek advice on next steps in treatment at home vs where/when  to seek care at Urgent Care or Hospital ED for further intervention and possible testing if indicated.  Patient verbalizes understanding with the above medical recommendations including the limitation of remote medical advice.  Specific follow-up / call-back criteria were given for patient to follow-up or seek medical care more urgently if needed.   - Time spent in direct consultation with patient on phone: 14 minutes   Saralyn PilarAlexander Chelbie Jarnagin, DO [facility] 11/19/2018, 8:40 AM

## 2018-11-19 NOTE — Addendum Note (Signed)
Addended by: Smitty Cords on: 11/19/2018 12:43 PM   Modules accepted: Orders

## 2018-11-19 NOTE — Patient Instructions (Addendum)
AVS info given over the phone. 

## 2018-11-20 ENCOUNTER — Telehealth: Payer: Self-pay | Admitting: Family Medicine

## 2018-11-20 NOTE — Telephone Encounter (Signed)
Able to pick up Anoro rx - she called back next day said she was able to get it.  Saralyn Pilar, DO [facility] 11/20/2018, 11:51 AM

## 2018-11-22 ENCOUNTER — Other Ambulatory Visit: Payer: Self-pay | Admitting: Family Medicine

## 2018-11-22 ENCOUNTER — Ambulatory Visit: Payer: Self-pay | Admitting: Pharmacist

## 2018-11-22 DIAGNOSIS — J432 Centrilobular emphysema: Secondary | ICD-10-CM

## 2018-11-22 MED ORDER — ALBUTEROL SULFATE HFA 108 (90 BASE) MCG/ACT IN AERS: 1.0000 | INHALATION_SPRAY | Freq: Four times a day (QID) | RESPIRATORY_TRACT | 3 refills | Status: DC | PRN

## 2018-11-22 NOTE — Chronic Care Management (AMB) (Signed)
  Chronic Care Management   Note  11/22/2018 Name: HALEEMA IODICE MRN: [id number] DOB: 19-May-1961   Subjective:   Gina Wells is a 58 y.o. year old female who is a primary care patient of Galen Manila, NP. The CM team was consulted for assistance with chronic disease management and care coordination.   I reached out to [name] by phone today.   Ms. Leuenberger was given information about Chronic Care Management services today including:  1. CCM service includes personalized support from designated clinical staff supervised by her physician, including individualized plan of care and coordination with other care providers 2. 24/7 contact phone numbers for assistance for urgent and routine care needs. 3. The patient may stop CCM services at any time (effective at the end of the month) by phone call to the office staff.  Patient agreed to services and verbal consent obtained.    Assessment:   Goals Addressed            This Visit's Progress   . Medication Assistance       Current Barriers:  . Financial Barriers  Pharmacist Clinical Goal(s):  Marland Kitchen Over the next 14 days, patient will work with CCM Pharmacist to address needs related to medication assistance  Interventions: . Confirm that patient was able to obtain Anoro Ellipta inhaler at no cost using a manufacturer copayment assistance card . Identify that patient's current albuterol rescue inhaler is expired and current prescription is also expired. o Review online formulary for patient's current prescription drug coverage to determine that the generic albuterol inhaler is a tier 2 option for the patient. - Note that per patient, she currently has [employer] coverage (Rx Letter Code 4D) o Collaboration with provider to have new albuterol inhaler prescription sent to pharmacy.  Patient Self Care Activities:  . Self administers medications as prescribed . Calls pharmacy  for medication refills . Calls provider office for new concerns or questions  Initial goal documentation        Plan:  The CM team will reach out to the patient again over the next 3 days.   Duanne Moron, PharmD, [facility] Clinical Pharmacist [facility] [employer] [phone]

## 2018-11-25 ENCOUNTER — Ambulatory Visit: Payer: Self-pay | Admitting: Pharmacist

## 2018-11-25 DIAGNOSIS — J449 Chronic obstructive pulmonary disease, unspecified: Secondary | ICD-10-CM

## 2018-11-25 NOTE — Chronic Care Management (AMB) (Signed)
  Chronic Care Management   Follow Up Note   11/25/2018 Name: MARILU WAHLER MRN: [id number] DOB: 08-Jun-1961  Referred by: Galen Manila, NP Reason for referral : Chronic Care Management (Patient Phone Call) and Care Coordination (Pharmacy Phone Call)   DOVA BACOTE is a 58 y.o. year old female who is a primary care patient of Galen Manila, NP. The CCM team was consulted for assistance with chronic disease management and care coordination needs.  Note that patient has a past medical history including but not limited to COPD, hypertension, GERD and hypothyroidism.  I reached out to patient's [facility] pharmacy for collaboration of care.   Reach out to [name] by phone today to follow up regarding medication management and assistance.  Review of patient status, including review of consultants reports, relevant laboratory and other test results, and collaboration with appropriate care team members and the patient's provider was performed as part of comprehensive patient evaluation and provision of chronic care management services.    Goals Addressed            This Visit's Progress   . Medication Assistance       Current Barriers:  . Financial Barriers  Pharmacist Clinical Goal(s):  Marland Kitchen Over the next 14 days, patient will work with CCM Pharmacist to address needs related to medication assistance  Interventions: . Identify that patient's current albuterol rescue inhaler is expired and current prescription is also expired. o Review online formulary for patient's current prescription drug coverage to determine that the generic albuterol inhaler is a tier 2 option for the patient. - Note that per patient, she currently has [employer] [place] [employer] coverage (Rx Letter Code 4D) o Collaboration with provider to have new albuterol inhaler prescription sent to pharmacy. o Social research officer, government Pharmacy for patient's copayment o Confirm  with patient that albuterol copayment is affordable and discuss potential savings option  Schedule appointment to complete medication review with patient.  Patient Self Care Activities:  . Self administers medications as prescribed . Calls pharmacy for medication refills . Calls provider office for new concerns or questions  Please see past updates related to this goal by clicking on the "Past Updates" button in the selected goal         Plan  Telephone follow up appointment with CCM Pharmacist scheduled for 11/28/2018 at 4 pm to complete medication review.  Duanne Moron, PharmD, [facility] Clinical Pharmacist [facility] [employer] [phone]

## 2018-11-25 NOTE — Patient Instructions (Signed)
Thank you allowing the Chronic Care Management Team to be a part of your care! It was a pleasure speaking with you today!     CCM (Chronic Care Management) Team    Janci Minor RN, BSN Nurse Care Coordinator  [phone]   Duanne Moron PharmD  Clinical Pharmacist  [phone]   Dickie La LCSW Clinical Social Worker [phone]  Visit Information  Goals Addressed            This Visit's Progress   . Medication Assistance       Current Barriers:  . Financial Barriers  Pharmacist Clinical Goal(s):  Marland Kitchen Over the next 14 days, patient will work with CCM Pharmacist to address needs related to medication assistance  Interventions: . Confirm that patient was able to afford Anoro Ellipta inhaler using a manufacturer copayment assistance card . Identify that patient's current albuterol rescue inhaler is expired and current prescription is also expired. o Review online formulary for patient's current prescription drug coverage to determine that the generic albuterol inhaler is a tier 2 option for the patient. - Note that per patient, she currently has [employer] [place] [employer] coverage (Rx Letter Code 4D) o Collaboration with provider to have new albuterol inhaler prescription sent to pharmacy.  Patient Self Care Activities:  . Self administers medications as prescribed . Calls pharmacy for medication refills . Calls provider office for new concerns or questions  Initial goal documentation        Ms. Essien was given information about Chronic Care Management services today including:  1. CCM service includes personalized support from designated clinical staff supervised by her physician, including individualized plan of care and coordination with other care providers 2. 24/7 contact phone numbers for assistance for urgent and routine care needs. 3. The patient may stop CCM services at any time (effective at the end of the month) by phone call to the  office staff.  Patient agreed to services and verbal consent obtained.   The patient verbalized understanding of instructions provided today and declined a print copy of patient instruction materials.   The CM team will reach out to the patient again over the next 3 days.   Duanne Moron, PharmD, [facility] Clinical Pharmacist [facility] [employer] [phone]

## 2018-11-25 NOTE — Patient Instructions (Signed)
Thank you allowing the Chronic Care Management Team to be a part of your care! It was a pleasure speaking with you today!     CCM (Chronic Care Management) Team    Janci Minor RN, BSN Nurse Care Coordinator  [phone]   Duanne Moron PharmD  Clinical Pharmacist  [phone]   Dickie La LCSW Clinical Social Worker [phone]  Visit Information  Goals Addressed            This Visit's Progress   . Medication Assistance       Current Barriers:  . Financial Barriers  Pharmacist Clinical Goal(s):  Marland Kitchen Over the next 14 days, patient will work with CCM Pharmacist to address needs related to medication assistance  Interventions: . Identify that patient's current albuterol rescue inhaler is expired and current prescription is also expired. o Review online formulary for patient's current prescription drug coverage to determine that the generic albuterol inhaler is a tier 2 option for the patient. - Note that per patient, she currently has [employer] [place] [employer] coverage (Rx Letter Code 4D) o Collaboration with provider to have new albuterol inhaler prescription sent to pharmacy. o Social research officer, government Pharmacy for patient's copayment o Confirm with patient that albuterol copayment is affordable and discuss potential savings option  Schedule appointment to complete medication review with patient.  Patient Self Care Activities:  . Self administers medications as prescribed . Calls pharmacy for medication refills . Calls provider office for new concerns or questions  Please see past updates related to this goal by clicking on the "Past Updates" button in the selected goal         The patient verbalized understanding of instructions provided today and declined a print copy of patient instruction materials.   Telephone follow up appointment with CCM team member scheduled for: 11/28/2018 at 4 pm  Duanne Moron, PharmD, [facility] Clinical  Pharmacist [facility] [employer] [phone]

## 2018-11-26 ENCOUNTER — Institutional Professional Consult (permissible substitution): Payer: Self-pay | Admitting: Pulmonary Disease

## 2018-11-26 NOTE — Progress Notes (Deleted)
PULMONARY CONSULT NOTE  Requesting MD/Service: Althea Charon Date of initial consultation: 11/26/18 Reason for consultation:   PT PROFILE: 58 y.o. female smoker  DATA:  INTERVAL:  HPI:    Past Medical History:  Diagnosis Date  . Alcoholism (HCC)   . Anxiety   . Depression   . Fatigue   . Hx of migraine headaches   . Hypertension   . Hyperthyroidism   . 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 KIDNEY STONE SURGERY    . URETERAL STENT PLACEMENT     2002 for kidney stones  . VAGINAL HYSTERECTOMY      MEDICATIONS: I have reviewed all medications and confirmed regimen as documented  Social History   Socioeconomic History  . Marital status: Single    Spouse name: Not on file  . Number of children: Not on file  . Years of education: Not on file  . Highest education level: Not on file  Occupational History  . Not on file  Social Needs  . Financial resource strain: Not on file  . Food insecurity:    Worry: Not on file    Inability: Not on file  . Transportation needs:    Medical: Not on file    Non-medical: Not on file  Tobacco Use  . Smoking status: Current Every Day Smoker    Packs/day: 0.50    Years: 50.00    Pack years: 25.00    Types: Cigarettes    Start date: 07/25/1975  . Smokeless tobacco: Never Used  Substance and Sexual Activity  . Alcohol use: No    Alcohol/week: 0.0 standard drinks  . Drug use: No  . Sexual activity: Yes  Lifestyle  . Physical activity:    Days per week: Not on file    Minutes per session: Not on file  . Stress: Not on file  Relationships  . Social connections:    Talks on phone: Not on file    Gets together: Not on file    Attends religious service: Not on file    Active member of club or organization: Not on file    Attends meetings of clubs or organizations: Not on file    Relationship status: Not on file  . Intimate partner violence:    Fear of current or ex  partner: Not on file    Emotionally abused: Not on file    Physically abused: Not on file    Forced sexual activity: Not on file  Other Topics Concern  . Not on file  Social History Narrative  . Not on file    Family History  Problem Relation Age of Onset  . Diabetes Mother   . Alcohol abuse Father   . Diabetes Father   . Cancer Father   . Cancer Paternal Aunt   . Alcohol abuse Brother     ROS: No fever, myalgias/arthralgias, unexplained weight loss or weight gain No new focal weakness or sensory deficits No otalgia, hearing loss, visual changes, nasal and sinus symptoms, mouth and throat problems No neck pain or adenopathy No abdominal pain, N/V/D, diarrhea, change in bowel pattern No dysuria, change in urinary pattern   There were no vitals filed for this visit.   EXAM:  Gen: WDWN, No overt respiratory distress HEENT: NCAT, sclera white, oropharynx normal Neck: Supple without LAN, thyromegaly, JVD Lungs: breath sounds ***, percussion ***, adventitious sounds: *** Cardiovascular: RRR, no murmurs noted Abdomen: Soft, nontender,  normal BS Ext: without clubbing, cyanosis, edema Neuro: CNs grossly intact, motor and sensory intact Skin: Limited exam, no lesions noted  DATA:   BMP Latest Ref Rng & Units 05/20/2018 05/19/2018 05/19/2018  Glucose 70 - 99 mg/dL 782(N173(H) 562(Z114(H) 308(M116(H)  BUN 6 - 20 mg/dL 16 57(Q21(H) 18  Creatinine 0.44 - 1.00 mg/dL 4.69(G1.08(H) 2.951.00 2.84(X1.03(H)  BUN/Creat Ratio 9 - 23 - - -  Sodium 135 - 145 mmol/L 140 143 139  Potassium 3.5 - 5.1 mmol/L 3.9 3.6 3.4(L)  Chloride 98 - 111 mmol/L 111 107 107  CO2 22 - 32 mmol/L 24 - 20(L)  Calcium 8.9 - 10.3 mg/dL 9.4 - 9.5    CBC Latest Ref Rng & Units 05/20/2018 05/19/2018 05/19/2018  WBC 4.0 - 10.5 K/uL 13.9(H) - 14.0(H)  Hemoglobin 12.0 - 15.0 g/dL 32.413.3 40.114.3 02.713.4  Hematocrit 36.0 - 46.0 % 41.7 42.0 42.9  Platelets 150 - 400 K/uL 239 - 198    CXR 09/14/17:  NACPD  I have personally reviewed all chest  radiographs reported above including CXRs and CT chest unless otherwise indicated  IMPRESSION:   No diagnosis found.   PLAN:     Billy Fischeravid Braedin Millhouse, MD PCCM service Mobile [phone] Pager [phone] 11/26/2018 10:50 AM

## 2018-11-28 ENCOUNTER — Ambulatory Visit: Payer: Self-pay | Admitting: Pharmacist

## 2018-11-28 ENCOUNTER — Other Ambulatory Visit: Payer: Self-pay

## 2018-11-28 DIAGNOSIS — K219 Gastro-esophageal reflux disease without esophagitis: Secondary | ICD-10-CM

## 2018-11-28 DIAGNOSIS — E038 Other specified hypothyroidism: Secondary | ICD-10-CM

## 2018-11-28 DIAGNOSIS — E039 Hypothyroidism, unspecified: Secondary | ICD-10-CM

## 2018-11-29 NOTE — Chronic Care Management (AMB) (Addendum)
Chronic Care Management   Follow Up Note   11/29/2018 Name: Gina BoysCharlotte D Jans MRN: [id number] DOB: 05-23-1961  Referred by: Galen ManilaKennedy, Lauren Renee, NP Reason for referral : Chronic Care Management (Patient Phone Call)   Gina Wells is a 58 y.o. year old female who is a primary care patient of Galen ManilaKennedy, Lauren Renee, NP. The CCM team was consulted for assistance with chronic disease management and care coordination needs.  Note that patient has a past medical history including but not limited to COPD, hypertension, GERD and hypothyroidism.   I reached out to Gina Boysharlotte D Whiteford by phone today.   Review of patient status, including review of consultants reports, relevant laboratory and other test results, and collaboration with appropriate care team members and the patient's provider was performed as part of comprehensive patient evaluation and provision of chronic care management services.    Goals Addressed            This Visit's Progress   . Medication Assistance       Current Barriers:  . Financial Barriers  Pharmacist Clinical Goal(s):  Marland Kitchen. Over the next 14 days, patient will work with CCM Pharmacist to address needs related to medication assistance  Interventions:  Perform chart review:  07/2018 PCP telephone note/lab results:   "Patient reported repeated seizures between last visit.  TSH is improving. Continue with increase of levothyroxine to 100 mcg once daily.   Patient to start keppra 500 mg bid.  Referral neurology evaluate seizures.   Repeat labs in about 2 months.  07/11/18 - patient advised to stop ranitidine and change to omeprazole 20 mg once daily for GERD.    05/19/18 - patient admitted to hospital for possible seizure secondary to severe hypothyroidism  Complete comprehensive medication review with patient  Reports completed levofloxacin course and prednisone course as prescribed by Dr. Althea CharonKaramalegos- feels better since completing.   Reports picked up albuterol inhaler from pharmacy and taking albuterol and Anoro inhalers as directed.  Patient reports that she never started taking Keppra, prescribed by PCP, because her daughter did not think that she should because she "is on too many medications".  Patient denies [facility] for evaluation of seizures  Counsel patient to follow up with Neurology as referred by PCP 07/31/2018  Reports no longer using ranitidine (product also currently recalled).  Reports taking levothyroxine as directed.   Note patient did NOT return to clinic for follow up TSH level in March   Counsel patient about importance of follow up/management of hypothyroidism.  Patient inquires about resources for eye care - reports needing surgery for cataracts on both eyes  Advised patient to follow up with health plan regarding eye exam/surgery benefits  Will collaborate with CM LCSW about other resources that might be available to assist patient with limited finances in need of eye surgery  Collaborate with CM Case Manager regarding patient complaint of chronic low energy and feeling like she is often sick  Collaborate with office regarding scheduling appointment to have patient's follow up TSH level drawn.  Patient Self Care Activities:  . Self administers medications as prescribed . Calls pharmacy for medication refills . Calls provider office for new concerns or questions  . Patient to [facility] for evaluation of seizures  Please see past updates related to this goal by clicking on the "Past Updates" button in the selected goal         Plan  1) Referral to  CM Social Worker for resources that might be available to assist patient with limited finances in need of eye surgery 2) CM Pharmacist will reach out to the patient again over the next 5 days.   Duanne Moron, PharmD, [facility] Clinical Pharmacist [facility] Medical  [employer] [phone]

## 2018-11-29 NOTE — Patient Instructions (Signed)
Thank you allowing the Chronic Care Management Team to be a part of your care! It was a pleasure speaking with you today!     CCM (Chronic Care Management) Team    Janci Minor RN, BSN Nurse Care Coordinator  [phone]   Duanne Moron PharmD  Clinical Pharmacist  [phone]   Dickie La LCSW Clinical Social Worker [phone]  Visit Information  Goals Addressed            This Visit's Progress   . Medication Assistance       Current Barriers:  . Financial Barriers  Pharmacist Clinical Goal(s):  Marland Kitchen Over the next 14 days, patient will work with CCM Pharmacist to address needs related to medication assistance  Interventions:  Perform chart review:  07/2018 PCP telephone note/lab results:   "Patient reported repeated seizures between last visit.  TSH is improving. Continue with increase of levothyroxine to 100 mcg once daily.   Patient to start keppra 500 mg bid.  Referral neurology evaluate seizures.   Repeat labs in about 2 months.  07/11/18 - patient advised to stop ranitidine and change to omeprazole 20 mg once daily for GERD.    05/19/18 - patient admitted to hospital for possible seizure secondary to severe hypothyroidism  Complete comprehensive medication review with patient  Reports completed levofloxacin course and prednisone course as prescribed by Dr. Althea Charon- feels better since completing.  Reports picked up albuterol inhaler from pharmacy and taking albuterol and Anoro inhalers as directed.  Patient reports that she never started taking Keppra, prescribed by PCP, because her daughter did not think that she should because she "is on too many medications".  Patient denies following up with [facility] Neurology for evaluation of seizures  Counsel patient to follow up with Neurology as referred by PCP 07/31/2018  Reports no longer using ranitidine (product also currently recalled).  Reports taking levothyroxine as directed.    Note patient did NOT return to clinic for follow up TSH level in March   Counsel patient about importance of follow up/management of hypothyroidism.  Patient inquires about resources for eye care - reports needing surgery for cataracts on both eyes  Advised patient to follow up with health plan regarding eye exam/surgery benefits  Will collaborate with CM LCSW about other resources that might be available to assist patient with limited finances in need of eye surgery  Collaborate with CM Case Manager regarding patient complaint of chronic low energy and feeling like she is often sick  Collaborate with office regarding scheduling appointment to have patient's follow up TSH level drawn.  Patient Self Care Activities:  . Self administers medications as prescribed . Calls pharmacy for medication refills . Calls provider office for new concerns or questions  . Patient to follow up with [facility] Neurology for evaluation of seizures  Please see past updates related to this goal by clicking on the "Past Updates" button in the selected goal         The patient verbalized understanding of instructions provided today and declined a print copy of patient instruction materials.   The CM team will reach out to the patient again over the next 5 days.   Duanne Moron, PharmD, [facility] Clinical Pharmacist [facility] Medical [employer] [phone]

## 2018-12-03 ENCOUNTER — Other Ambulatory Visit: Payer: Self-pay

## 2018-12-03 DIAGNOSIS — E890 Postprocedural hypothyroidism: Secondary | ICD-10-CM

## 2018-12-04 ENCOUNTER — Ambulatory Visit: Payer: Self-pay | Admitting: Pharmacist

## 2018-12-04 ENCOUNTER — Telehealth: Payer: Self-pay | Admitting: Family Medicine

## 2018-12-04 DIAGNOSIS — E890 Postprocedural hypothyroidism: Secondary | ICD-10-CM

## 2018-12-04 LAB — TSH: TSH: 5.14 mIU/L — ABNORMAL HIGH (ref 0.40–4.50)

## 2018-12-04 MED ORDER — LEVOTHYROXINE SODIUM 112 MCG PO TABS: 112.0000 ug | ORAL_TABLET | Freq: Every day | ORAL | 2 refills | Status: DC

## 2018-12-04 NOTE — Chronic Care Management (AMB) (Signed)
  Chronic Care Management   Follow Up Note   12/04/2018 Name: Gina Wells MRN: [id number] DOB: [birth year]  Referred by: Galen Manila, NP Reason for referral : Chronic Care Management   Gina Wells is a 58 y.o. year old female who is a primary care patient of Galen Manila, NP. The CCM team was consulted for assistance with chronic disease management and care coordination needs.  Note that patient has a past medical history including but not limited to COPD, hypertension, GERD and hypothyroidism.  Perform chart review for result of TSH level drawn 12/03/2018.   Collaborate with Dr. Althea Charon (covering for PCP) regarding result.  Review of patient status, including review of consultants reports, relevant laboratory and other test results, and collaboration with appropriate care team members and the patient's provider was performed as part of comprehensive patient evaluation and provision of chronic care management services.    Assessment  Lab Results  Component Value Date   TSH 5.14 (H) 12/03/2018    Goals Addressed            This Visit's Progress   . Medication Assistance       Current Barriers:  . Financial Barriers  Pharmacist Clinical Goal(s):  Marland Kitchen Over the next 14 days, patient will work with CCM Pharmacist to address needs related to medication assistance  Interventions:  Collaborate with office regarding scheduling appointment to have patient's follow up TSH level drawn  Perform chart review for result of TSH level drawn 12/03/2018.  Collaborate with Dr. Althea Charon (covering for PCP) regarding elevated TSH result. Recommend increasing patient's levothyroxine dose from 100 mcg to 112 mcg based on elevated TSH level and patient's report of chronic low energy when we spoke last week.  Provider agrees to dose change  Patient to be seen again in 6 weeks for follow up TSH and T4 level.  Patient inquires about resources for eye care -  reports needing surgery for cataracts on both eyes  Advised patient to follow up with health plan regarding eye exam/surgery benefits  Collaborate with CM LCSW about other resources that might be available to assist patient with limited finances in need of eye surgery  Patient Self Care Activities:  . Self administers medications as prescribed . Calls pharmacy for medication refills . Calls provider office for new concerns or questions  . Patient to follow up with [facility] Neurology for evaluation of seizures  Please see past updates related to this goal by clicking on the "Past Updates" button in the selected goal          Plan  1) CM Pharmacist to follow up with patient in the next 7 days   Duanne Moron, PharmD, [facility] Clinical Pharmacist [facility] [phone]

## 2018-12-04 NOTE — Telephone Encounter (Signed)
Spoke with Duanne Moron [facility] via EHR chart communication messages, agree with request to increase dose from 100 to 112 levothyroxine based on clinical symptoms and still mildly abnormal lab result  Would follow-up 6-8 weeks for repeat labs and dose adjust levothyroxine  Saralyn Pilar, DO [facility] 12/04/2018, 5:24 PM

## 2018-12-04 NOTE — Telephone Encounter (Signed)
-----   Message from Daphane Shepherd, [facility] sent at 12/04/2018 11:03 AM EDT ----- Dr. Althea Charon,  In my medication review with Ms. Kerekes, patient reported adherence to levothyroxine 100 mcg once daily. Patient complained of chronic low energy. I collaborated with Janice Coffin to have Ms. Dedon come to have her overdue TSH level drawn (as previously ordered by Lauren to be drawn in March). Would you please consider increasing Ms. Cacal' levothyroxine dose to 112 mcg with follow up again in another 6-8 weeks?  Thank you  Gentry Fitz

## 2018-12-05 NOTE — Telephone Encounter (Signed)
The pt was notified. No questions or concerns. 

## 2018-12-10 ENCOUNTER — Ambulatory Visit: Payer: Self-pay | Admitting: Pharmacist

## 2018-12-10 DIAGNOSIS — E890 Postprocedural hypothyroidism: Secondary | ICD-10-CM

## 2018-12-10 DIAGNOSIS — R569 Unspecified convulsions: Secondary | ICD-10-CM

## 2018-12-10 NOTE — Chronic Care Management (AMB) (Signed)
  Chronic Care Management   Follow Up Note   12/10/2018 Name: Gina Wells MRN: [id number] DOB: 28-Aug-1960  Referred by: Galen Manila, NP Reason for referral : Chronic Care Management (Patient Phone Call)   Gina Wells is a 58 y.o. year old female who is a primary care patient of Galen Manila, NP. The CCM team was consulted for assistance with chronic disease management and care coordination needs. Note that patient has a past medical history including but not limited to COPD, hypertension, GERD and hypothyroidism.  I reached out to [name] by phone today.   [name] reports that she she believes that she had another seizure yesterday while she was taking a shower. Reports feeling hot, shaky, sweaty and dizzy. Called daughter who came to care for her. Denies loss consciousness. Reports feeling cold and numb afterwards. Thinks that this lasted 10-15 minutes.   Call patient back to advise of further close monitoring and return precautions as recommended by Dr. Althea Charon. Leave a voicemail.  Reach patient with second call back.  Review of patient status, including review of consultants reports, relevant laboratory and other test results, and collaboration with appropriate care team members and the patient's provider was performed as part of comprehensive patient evaluation and provision of chronic care management services.    Goals Addressed            This Visit's Progress   . Medication Managment       Current Barriers:  . Low Vision . Financial Barriers  Pharmacist Clinical Goal(s):  Marland Kitchen Over the next 30 days, patient will work with CM Pharmacist to address needs related to opitimization of medication managment and coordination of care  Interventions: . Counsel patient again today about the importance of following up with [facility] Neurology for evaluation of these episodes.  o Note that patient originally referred to Dr.  Sherryll Burger with [facility] in January. o Provide patient with phone number for Neurologist  Collaborate with Dr. Althea Charon to let him know about patient's report of her episode yesterday.  Advise patient of further close monitoring and return precautions as recommended by Dr. Althea Charon  Patient confirms that she has been taking the new dose of levothyroxine, 112 mcg daily, as prescribed on 5/13.   Note patient scheduled for follow up TSH and T4 level labs on 6/16  Patient Self Care Activities:  . Self administers medications as prescribed . Attends all scheduled provider appointments  Patient to follow up with [facility] Neurology for evaluation of seizures as directed by PCP . Calls pharmacy for medication refills . Calls provider office for new concerns or questions  Initial goal documentation        Plan  The CM team will reach out to the patient again over the next 7 days.   Duanne Moron, PharmD, [facility] Clinical Pharmacist [facility] Medical [employer] [phone]

## 2018-12-10 NOTE — Patient Instructions (Signed)
Thank you allowing the Chronic Care Management Team to be a part of your care! It was a pleasure speaking with you today!     CCM (Chronic Care Management) Team    Janci Minor RN, BSN Nurse Care Coordinator  [phone]   Duanne Moron PharmD  Clinical Pharmacist  [phone]   Dickie La LCSW Clinical Social Worker [phone]  Visit Information  Goals Addressed            This Visit's Progress   . Medication Managment       Current Barriers:  . Low Vision . Financial Barriers  Pharmacist Clinical Goal(s):  Marland Kitchen Over the next 30 days, patient will work with CM Pharmacist to address needs related to opitimization of medication managment and coordination of care  Interventions: . Counsel patient again today about the importance of following up with [facility] Neurology for evaluation of these episodes.  o Note that patient originally referred to Dr. Sherryll Burger with [facility] Neurology in January. o Provide patient with phone number for Neurologist  Collaborate with Dr. Althea Charon to let him know about patient's report of her episode yesterday.  Advise patient of further close monitoring and return precautions as recommended by Dr. Althea Charon  Patient confirms that she has been taking the new dose of levothyroxine, 112 mcg daily, as prescribed on 5/13.   Note patient scheduled for follow up TSH and T4 level labs on 6/16  Patient Self Care Activities:  . Self administers medications as prescribed . Attends all scheduled provider appointments  Patient to follow up with [facility]rology for evaluation of seizures as directed by PCP . Calls pharmacy for medication refills . Calls provider office for new concerns or questions  Initial goal documentation        The patient verbalized understanding of instructions provided today and declined a print copy of patient instruction materials.   The CM team will reach out to the patient again over the  next 7 days.   Duanne Moron, PharmD, [facility] Clinical Pharmacist [facility] Medical [employer] [phone]

## 2018-12-11 ENCOUNTER — Telehealth: Payer: Self-pay | Admitting: *Deleted

## 2018-12-11 NOTE — Telephone Encounter (Signed)
I spoke with pt this am concerning her upcoming cardiology appointment 12/27/2018 @ 10:30 with Dr.End. I needed to confirm and obtain consent due to appointment not scheduled by our office. Appointment was scheduled by [facility].  Pt seemed very confused and frustrated bc she was unaware of having to see a cardiologist. She mentioned that she was told her appointment would be with a pulmonologist 12/27/2018. She mentioned that her PCP never told her she needed to be seen by a cardiologist only by neuro and pulmonologist. There was a scheduled no show appointment 11/26/2018 with Dr.Simond in the past. I'm not sure if pt's appointment was scheduled with incorrect specialist.  Pt aware that we will contact here concerning cardiology/pulmonary appointment.  Please advise for pt was in concern and knowledge that her appointment was with pulmonary on 12/27/2018.

## 2018-12-11 NOTE — Telephone Encounter (Signed)
Pt has been contacted she will contact PCP. I have cancelled appointment at this time. She will contact our office if she needs an appointment.

## 2018-12-17 ENCOUNTER — Ambulatory Visit: Payer: Self-pay | Admitting: Pharmacist

## 2018-12-17 DIAGNOSIS — R569 Unspecified convulsions: Secondary | ICD-10-CM

## 2018-12-17 NOTE — Chronic Care Management (AMB) (Signed)
  Chronic Care Management   Follow Up Note   12/17/2018 Name: Gina Wells MRN: [id number] DOB: Feb 18, 1961  Referred by: Galen Manila, NP Reason for referral : Chronic Care Management (Patient Phone Call) and Care Coordination [facility] Neurology)   Gina Wells is a 58 y.o. year old female who is a primary care patient of Galen Manila, NP. The CCM team was consulted for assistance with chronic disease management and care coordination needs.  Note that patient has a past medical history including but not limited to COPD, hypertension, GERD and hypothyroidism.  I reached out to Gina Wells by phone today.   Review of patient status, including review of consultants reports, relevant laboratory and other test results, and collaboration with appropriate care team members and the patient's provider was performed as part of comprehensive patient evaluation and provision of chronic care management services.    Goals Addressed            This Visit's Progress   . Medication Managment       Current Barriers:  . Low Vision . Financial Barriers  Pharmacist Clinical Goal(s):  Marland Kitchen Over the next 30 days, patient will work with CM Pharmacist to address needs related to opitimization of medication managment and coordination of care  Interventions: . Perform chart review  o Note that patient had Initial Visit with Dr. Sherryll Burger at [facility] Neurology on 12/13/18 for evaluation of her spells. o Per chart, Dr. Sherryll Burger ordered lamotrigine XR for patient to taper as directed up to 100 mg once daily o Patient to have and EEG completed  Follow up call to patient  Gina Wells stated that she did not start lamotrigine XR as prescribed by Neurologist as it was unaffordable  Review online formulary for patient's current prescription drug coverage. Per formulary, lamotrigine XR is a tier 4 option for the patient. Note that lamotrigine Immediate release (IR) is a tier 1  alternative  Gina Wells asks that I call to follow up with her Neurologist regarding this affordability issue  Collaborative call to [facility] regarding medication affordability issue - leave message with Crystal for Dr. Sherryll Burger letting provider know that patient is unable to afford lamotrigine XR as this is a tier 4 option through her plan. Request provider consider alternatives, such as lamotrigine IR (tier 1 alternative).  Patient Self Care Activities:  . Self administers medications as prescribed . Attends all scheduled provider appointments  Patient to follow up with [facility] Neurology for scheduling of EEG . Calls pharmacy for medication refills . Calls provider office for new concerns or questions  Please see past updates related to this goal by clicking on the "Past Updates" button in the selected goal        Plan   CM Pharmacist will reach out to the patient again over the next 7 days.   Duanne Moron, PharmD, [facility] Clinical Pharmacist [facility] Medical [employer] [phone]

## 2018-12-17 NOTE — Patient Instructions (Signed)
Thank you allowing the Chronic Care Management Team to be a part of your care! It was a pleasure speaking with you today!     CCM (Chronic Care Management) Team    Janci Minor RN, BSN Nurse Care Coordinator  [phone]   Duanne Moron PharmD  Clinical Pharmacist  [phone]   Dickie La LCSW Clinical Social Worker [phone]  Visit Information  Goals Addressed            This Visit's Progress   . Medication Managment       Current Barriers:  . Low Vision . Financial Barriers  Pharmacist Clinical Goal(s):  Marland Kitchen Over the next 30 days, patient will work with CM Pharmacist to address needs related to opitimization of medication managment and coordination of care  Interventions: . Perform chart review  o Note that patient had Initial Visit with Dr. Sherryll Burger at [facility] Neurology on 12/13/18 for evaluation of her spells. o Per chart, Dr. Sherryll Burger ordered lamotrigine XR for patient to taper as directed up to 100 mg once daily o Patient to have and EEG completed  Follow up call to patient  Ms. Rackard stated that she did not start lamotrigine XR as prescribed by Neurologist as it was unaffordable  Review online formulary for patient's current prescription drug coverage. Per formulary, lamotrigine XR is a tier 4 option for the patient. Note that lamotrigine Immediate release (IR) is a tier 1 alternative  Ms. Vadnais asks that I call to follow up with her Neurologist regarding this affordability issue  Collaborative call to [facility] regarding medication affordability issue - leave message with Crystal for Dr. Sherryll Burger letting provider know that patient is unable to afford lamotrigine XR as this is a tier 4 option through her plan. Request provider consider alternatives, such as lamotrigine IR (tier 1 alternative).  Patient Self Care Activities:  . Self administers medications as prescribed . Attends all scheduled provider appointments  Patient to follow  up with [facility] Neurology for scheduling of EEG . Calls pharmacy for medication refills . Calls provider office for new concerns or questions  Please see past updates related to this goal by clicking on the "Past Updates" button in the selected goal         The patient verbalized understanding of instructions provided today and declined a print copy of patient instruction materials.   The CM team will reach out to the patient again over the next 7 days.   Duanne Moron, PharmD, [facility] Clinical Pharmacist [facility] Medical [employer] [phone]

## 2018-12-18 NOTE — Telephone Encounter (Signed)
Closing encounter

## 2018-12-19 ENCOUNTER — Ambulatory Visit: Payer: Self-pay | Admitting: Licensed Clinical Social Worker

## 2018-12-19 ENCOUNTER — Other Ambulatory Visit: Payer: Self-pay

## 2018-12-19 ENCOUNTER — Ambulatory Visit: Payer: Self-pay | Admitting: Pharmacist

## 2018-12-19 DIAGNOSIS — F3130 Bipolar disorder, current episode depressed, mild or moderate severity, unspecified: Secondary | ICD-10-CM

## 2018-12-19 DIAGNOSIS — R569 Unspecified convulsions: Secondary | ICD-10-CM

## 2018-12-19 NOTE — Patient Instructions (Signed)
Thank you allowing the Chronic Care Management Team to be a part of your care! It was a pleasure speaking with you today!     CCM (Chronic Care Management) Team    Janci Minor RN, BSN Nurse Care Coordinator  [phone]   Duanne Moron PharmD  Clinical Pharmacist  [phone]   Dickie La LCSW Clinical Social Worker [phone]  Visit Information  Goals Addressed            This Visit's Progress   . Medication Managment       Current Barriers:  . Low Vision . Financial Barriers  Pharmacist Clinical Goal(s):  Marland Kitchen Over the next 30 days, patient will work with CM Pharmacist to address needs related to opitimization of medication managment and coordination of care  Interventions: . Perform chart review   I placed collaborative call to [facility] on 5/26 regarding medication affordability issue - patient is unable to afford lamotrigine XR as this is a tier 4 option through her plan. Request provider consider alternatives, such as lamotrigine IR (tier 1 alternative).  Neurologist approved change to lamotrigine IR for affordability, prescription sent to pharmacy and patient notified  EEG procedure scheduled for 5/31  Follow up call placed to Ms. Gina Wells  Patient reports that [facility] needed to order the lamotrigine for her new prescription, but that she will pick this prescription up when it is filled  Patient previously confirmed receiving counseling by provider regarding medication side effects including risk of skin rash  Confirms that she is aware of appointment for EEG procedure and Pulmonology appointment.  Patient Self Care Activities:  . Self administers medications as prescribed . Attends all scheduled provider appointments o EEG procedure on 5/31 o Pulmonology on 6/2 . Calls pharmacy for medication refills . Calls provider office for new concerns or questions  Please see past updates related to this goal by clicking on  the "Past Updates" button in the selected goal         The patient verbalized understanding of instructions provided today and declined a print copy of patient instruction materials.   The CM team will reach out to the patient again over the next 30 days.   Duanne Moron, PharmD, [facility] Clinical Pharmacist [facility] Medical [employer] [phone]

## 2018-12-19 NOTE — Chronic Care Management (AMB) (Signed)
  Chronic Care Management    Clinical Social Work General Note  12/19/2018 Name: Gina Wells MRN: [id number] DOB: [birth year]  Gina Wells is a 58 y.o. year old female who is a primary care patient of Galen Manila, NP. The CCM was consulted to assist the patient with [employer].   Review of patient status, including review of consultants reports, relevant laboratory and other test results, and collaboration with appropriate care team members and the patient's provider was performed as part of comprehensive patient evaluation and provision of chronic care management services.    Goals Addressed    . I need financial support right now (pt-stated)       Current Barriers:  . Financial constraints . Limited social support . ADL IADL limitations . Lacks knowledge of community resource: available financial support resources as well as cataract surgery financial assistance resources  Clinical Social Work Clinical Goal(s):  Marland Kitchen Over the next 90 days, client will work with SW to address concerns related to lack of financial stability  . Over the next 90 days, patient will work with LCSW to address needs related to need for cataract surgery by providing resource support and education  Interventions: . Patient interviewed and appropriate assessments performed . Provided patient with information about financial assistance resources available to her . Discussed plans with patient for ongoing care management follow up and provided patient with direct contact information for care management team . Advised patient to check email and review financial support resources that were sent out on 12/19/2018. Education provided as well. . Assisted patient/caregiver with obtaining information about health plan benefits  . Collaborated with CCM Pharamcist  Patient Self Care Activities:  . Attends all scheduled provider appointments . Calls provider office for new concerns or  questions  Initial goal documentation    Follow Up Plan: SW will follow up with patient by phone over the next 2-4 weeks      Dickie La, BSW, MSW, LCSW [facility] Care Management/[place]  [email or web address] Phone: [phone]

## 2018-12-19 NOTE — Chronic Care Management (AMB) (Signed)
  Chronic Care Management   Follow Up Note   12/19/2018 Name: Gina Wells MRN: [id number] DOB: 04/18/1961  Referred by: Gina Manila, NP Reason for referral : Chronic Care Management (Patient Phone Call)   Gina Wells is a 58 y.o. year old female who is a primary care patient of Gina Manila, NP. The CCM team was consulted for assistance with chronic disease management and care coordination needs.  Gina Wells has a past medical history including but not limited to COPD, hypertension, GERD and hypothyroidism.  I reached out to Gina Wells by phone today.   Review of patient status, including review of consultants reports, relevant laboratory and other test results, and collaboration with appropriate care team members and the patient's provider was performed as part of comprehensive patient evaluation and provision of chronic care management services.    Goals Addressed            This Visit's Progress   . Medication Managment       Current Barriers:  . Low Vision . Financial Barriers  Pharmacist Clinical Goal(s):  Marland Kitchen Over the next 30 days, patient will work with CM Pharmacist to address needs related to opitimization of medication managment and coordination of care  Interventions: . Perform chart review   I placed collaborative call to [facility] on 5/26 regarding medication affordability issue - patient is unable to afford lamotrigine XR as this is a tier 4 option through her plan. Request provider consider alternatives, such as lamotrigine IR (tier 1 alternative).  Neurologist approved change to lamotrigine IR for affordability, prescription sent to pharmacy and patient notified  EEG procedure scheduled for 5/31  Follow up call placed to Gina Wells  Patient reports that [facility] needed to order the lamotrigine for her new prescription, but that she will pick this prescription up when it is filled   Patient previously confirmed receiving counseling by provider regarding medication side effects including risk of skin rash  Confirms that she is aware of appointment for EEG procedure and Pulmonology appointment.  Patient Self Care Activities:  . Self administers medications as prescribed . Attends all scheduled provider appointments o EEG procedure on 5/31 o Pulmonology on 6/2 . Calls pharmacy for medication refills . Calls provider office for new concerns or questions  Please see past updates related to this goal by clicking on the "Past Updates" button in the selected goal         Plan  The CM team will reach out to the patient again over the next 30 days.  The patient has been provided with contact information for the chronic care management team and has been advised to call with any health related questions or concerns.   Duanne Moron, PharmD, [facility] Clinical Pharmacist [facility] Medical [employer] [phone]

## 2018-12-24 ENCOUNTER — Ambulatory Visit (INDEPENDENT_AMBULATORY_CARE_PROVIDER_SITE_OTHER): Payer: Self-pay | Admitting: Family Medicine

## 2018-12-24 ENCOUNTER — Other Ambulatory Visit: Payer: Self-pay

## 2018-12-24 ENCOUNTER — Encounter: Payer: Self-pay | Admitting: Family Medicine

## 2018-12-24 DIAGNOSIS — J432 Centrilobular emphysema: Secondary | ICD-10-CM

## 2018-12-24 DIAGNOSIS — E890 Postprocedural hypothyroidism: Secondary | ICD-10-CM

## 2018-12-24 NOTE — Patient Instructions (Signed)
AVS info given to patient. No MyChart access  Please schedule a Follow-up Appointment to: Return in about 2 months (around 02/23/2019) for thyroid, pulm updates w/ PCP - ?refer GYN hormone.  Saralyn Pilar, DO [facility], [place]

## 2018-12-24 NOTE — Progress Notes (Signed)
Virtual Visit via Telephone The purpose of this virtual visit is to provide medical care while limiting exposure to the novel coronavirus (COVID19) for both patient and office staff.  Consent was obtained for phone visit:  Yes.   Answered questions that patient had about telehealth interaction:  Yes.   I discussed the limitations, risks, security and privacy concerns of performing an evaluation and management service by telephone. I also discussed with the patient that there may be a patient responsible charge related to this service. The patient expressed understanding and agreed to proceed.  Patient Location: Home Provider Location: [facility] (Office)  PCP is Wilhelmina McardleLauren Kennedy, AGPCNP-BC - I am currently covering during her maternity leave.   ---------------------------------------------------------------------- Chief Complaint  Patient presents with  . Hypothyroidism    as per patient feels hot or shaky when being active, gets bruises stays for weeks   . Fatigue    S: Reviewed CMA documentation. I have called patient and gathered additional HPI as follows:  Hot Flashes / Shaky / Fatigue with exertion / History of Hypothyroidism s/p thyroidectomy, s/p Hysterectomy - Lab trend TSH >82 (08/2017), then 54 (04/2018< then 17 (07/2018), last result 5.14 (11/2018), she has done better back on levothyroxine, still admits has some variety of symptoms question if thyroid or other issue. Last dose inc from 100 to 112 - Today patient reports persistent issue with fatigue and dyspnea on exertion often, she was referred to Pulmonology after last apt, her apt with them is tomorrow on 6/3. Has not seen Cardiology. On Anoro inhaler now with some relief. Has albuterol PRN - Additionally asking about if weakness or hot flashes can be post menopausal hormonal symptoms  Denies any high risk travel to areas of current concern for COVID19. Denies any known or suspected exposure to person with or  possibly with COVID19.  Denies any fevers, chills, sweats, body ache, cough, shortness of breath, sinus pain or pressure, headache, abdominal pain, diarrhea  Past Medical History:  Diagnosis Date  . Alcoholism (HCC)   . Anxiety   . Depression   . Fatigue   . Hx of migraine headaches   . Hypertension   . Hyperthyroidism   . 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 Smoking status: Current Every Day Smoker    Packs/day: 0.50    Years: 50.00    Pack years: 25.00    Types: Cigarettes    Start date: 07/25/1975  . Smokeless tobacco: Never Used  Substance Use Topics  . Alcohol use: No    Alcohol/week: 0.0 standard drinks  . Drug use: No    Current Outpatient Medications:  .  albuterol (VENTOLIN HFA) 108 (90 Base) MCG/ACT inhaler, Inhale 1-2 puffs into the lungs every 6 (six) hours as needed for wheezing or shortness of breath., Disp: 1 Inhaler, Rfl: 3 .  hydrochlorothiazide (HYDRODIURIL) 25 MG tablet, Take 1 tablet (25 mg total) by mouth daily., Disp: 90 tablet, Rfl: 1 .  levothyroxine (SYNTHROID) 112 MCG tablet, Take 1 tablet (112 mcg total) by mouth daily before breakfast., Disp: 30 tablet, Rfl: 2 .  omeprazole (PRILOSEC) 20 MG capsule, Take 1 capsule (20 mg total) by mouth daily before breakfast., Disp: 90 capsule, Rfl: 1 .  umeclidinium-vilanterol (ANORO ELLIPTA) 62.5-25 MCG/INH AEPB, Inhale 1 puff into the lungs daily., Disp: , Rfl:  .  [START ON 01/16/2019] lamoTRIgine (LAMICTAL) 25 MG tablet, Take 25 mg by mouth. 25 mg two times  a day for 2 weeks then increase to 50 mg two times a day., Disp: , Rfl:   Depression screen [facility] 2/9 12/24/2018 11/19/2018 07/11/2018  Decreased Interest 0 1 0  Down, Depressed, Hopeless 0 0 0  PHQ - 2 Score 0 1 0  Altered sleeping 0 3 1  Tired, decreased energy 0 3 0  Change in appetite 0 3 0  Feeling bad or failure about yourself  0 0 0  Trouble concentrating 0 1 0  Moving slowly or fidgety/restless 0 0 0  Suicidal  thoughts 0 0 0  PHQ-9 Score 0 11 1  Difficult doing work/chores Not difficult at all Not difficult at all Not difficult at all    GAD 7 : Generalized Anxiety Score 11/19/2018  Nervous, Anxious, on Edge 3  Control/stop worrying 0  Worry too much - different things 0  Trouble relaxing 3  Restless 0  Easily annoyed or irritable 3  Afraid - awful might happen 0  Total GAD 7 Score 9  Anxiety Difficulty Not difficult at all    -------------------------------------------------------------------------- O: No physical exam performed due to remote telephone encounter.    Recent Results (from the past 2160 hour(s))  TSH     Status: Abnormal   Collection Time: 12/03/18  8:29 AM  Result Value Ref Range   TSH 5.14 (H) 0.40 - 4.50 mIU/L    -------------------------------------------------------------------------- A&P:  Problem List Items Addressed This Visit    Centrilobular emphysema (HCC)   Postablative hypothyroidism - Primary     Clinically uncertain etiology of current constellation of symptoms, seems like it could be hypothyroidism related, but now overall TSH has dramatically improved but has some clinical symptoms still residually - Also with COPD and dyspnea lack of improvement on maintenance therapy, now awaiting pulmonologist opinion tomorrow 6/3  Await further updates from Pulm, may warrant cardiology refer in future if persistent  Next lab would offer TSH in 6-8 weeks  Future if ultimately no clear cause of symptoms from pulmonology vs thyroid or even possibly cardiac, would refer to GYN for consultation consider hormone therapy if indicated  No orders of the defined types were placed in this encounter.   Follow-up: - Return in 2-3 months for Thyroid follow=up / updates from pulmonology  Patient verbalizes understanding with the above medical recommendations including the limitation of remote medical advice.  Specific follow-up and call-back criteria were given for  patient to follow-up or seek medical care more urgently if needed.   - Time spent in direct consultation with patient on phone: 15 minutes   Saralyn Pilar, DO [facility] 12/24/2018, 2:47 PM

## 2018-12-25 ENCOUNTER — Other Ambulatory Visit
Admission: RE | Admit: 2018-12-25 | Discharge: 2018-12-25 | Disposition: A | Discharge status: Home / Self Care | Payer: Self-pay | Source: Ambulatory Visit | Attending: Pulmonary Disease | Admitting: Pulmonary Disease

## 2018-12-25 DIAGNOSIS — R0600 Dyspnea, unspecified: Secondary | ICD-10-CM | POA: Insufficient documentation

## 2018-12-25 LAB — BRAIN NATRIURETIC PEPTIDE: B Natriuretic Peptide: 22 pg/mL (ref 0.0–100.0)

## 2018-12-27 ENCOUNTER — Ambulatory Visit: Payer: Self-pay | Admitting: Pharmacist

## 2018-12-27 ENCOUNTER — Telehealth: Payer: Self-pay | Admitting: Internal Medicine

## 2018-12-27 DIAGNOSIS — J449 Chronic obstructive pulmonary disease, unspecified: Secondary | ICD-10-CM

## 2018-12-27 NOTE — Chronic Care Management (AMB) (Signed)
  Care Management   Follow Up Note   12/27/2018 Name: Gina Wells MRN: [id number] DOB: [birth year]  Referred by: Gina Manila, NP Reason for referral : Chronic Care Management (Patient Phone Call)   Gina Wells is a 58 y.o. year old female who is a primary care patient of Gina Manila, NP. The Care Management team was consulted for assistance with chronic disease management and care coordination needs.  Gina Wells has a past medical history including but not limited to COPD, hypertension, GERD and hypothyroidism.  Receive a voicemail from Gina Wells requesting a call back.  I reached out to Gina Wells by phone today.   Review of patient status, including review of consultants reports, relevant laboratory and other test results, and collaboration with appropriate care team members and the patient's provider was performed as part of comprehensive patient evaluation and provision of chronic care management services.    Goals Addressed            This Visit's Progress   . Medication Managment       Current Barriers:  . Low Vision . Financial Barriers  Pharmacist Clinical Goal(s):  Marland Kitchen Over the next 30 days, patient will work with CM Pharmacist to address needs related to opitimization of medication managment and coordination of care  Interventions: . Perform chart review  o Patient seen by Pulmonologist, Dr. Karna Christmas, on 6/3 - Patient started on ipratropium-albuterol nebulizer solution and nicotine inhaler - Scheduled to return in 4 weeks for PFT  Gina Wells reports that she had reached out for assistance with obtaining nebulizer machine; confirms able to obtain machine now.  Counsel patient about additional resources for smoking cessation, including [place] Quitline.   Patient reports that she has ordered the nicotine inhaler as prescribed by her Pulmonologist and will pick this up from the pharmacy.  Patient Self Care Activities:   . Self administers medications as prescribed . Attends all scheduled provider appointments . Calls pharmacy for medication refills . Calls provider office for new concerns or questions  Please see past updates related to this goal by clicking on the "Past Updates" button in the selected goal         Plan  The care management team will reach out to the patient again over the next 3 weeks  Duanne Moron, PharmD, [facility] Clinical Pharmacist [facility] Medical [employer] [phone]

## 2018-12-27 NOTE — Patient Instructions (Signed)
Thank you allowing the Care Management Team to be a part of your care! It was a pleasure speaking with you today!     Care Management Team    Janci Minor RN, BSN Nurse Care Coordinator  [phone]   Duanne Moron PharmD  Clinical Pharmacist  [phone]   Dickie La LCSW Clinical Social Worker [phone]   Visit Information  Goals Addressed            This Visit's Progress   . Medication Managment       Current Barriers:  . Low Vision . Financial Barriers  Pharmacist Clinical Goal(s):  Marland Kitchen Over the next 30 days, patient will work with CM Pharmacist to address needs related to opitimization of medication managment and coordination of care  Interventions: . Perform chart review  o Patient seen by Pulmonologist, Dr. Karna Christmas, on 6/3 - Patient started on ipratropium-albuterol nebulizer solution and nicotine inhaler - Scheduled to return in 4 weeks for PFT  Ms. Spurbeck reports that she had reached out for assistance with obtaining nebulizer machine; confirms able to obtain machine now.  Counsel patient about additional resources for smoking cessation, including [place] Quitline.   Patient reports that she has ordered the nicotine inhaler as prescribed by her Pulmonologist and will pick this up from the pharmacy.  Patient Self Care Activities:  . Self administers medications as prescribed . Attends all scheduled provider appointments . Calls pharmacy for medication refills . Calls provider office for new concerns or questions  Please see past updates related to this goal by clicking on the "Past Updates" button in the selected goal         The patient verbalized understanding of instructions provided today and declined a print copy of patient instruction materials.   The care management team will reach out to the patient again over the next 21 days.   Duanne Moron, PharmD, [facility] Clinical Pharmacist [facility] Medical [employer] [phone]

## 2018-12-30 ENCOUNTER — Other Ambulatory Visit: Payer: Self-pay

## 2019-01-01 ENCOUNTER — Other Ambulatory Visit: Payer: Self-pay

## 2019-01-02 ENCOUNTER — Telehealth: Payer: Self-pay

## 2019-01-02 ENCOUNTER — Ambulatory Visit: Payer: Self-pay | Admitting: Licensed Clinical Social Worker

## 2019-01-02 LAB — TSH: TSH: 1.02 mIU/L (ref 0.40–4.50)

## 2019-01-02 LAB — T4, FREE: Free T4: 1.4 ng/dL (ref 0.8–1.8)

## 2019-01-02 NOTE — Chronic Care Management (AMB) (Signed)
  Chronic Care Management    Clinical Social Work Follow Up Note  01/02/2019 Name: Gina Wells MRN: [id number] DOB: [birth year]  Gina Wells is a 58 y.o. year old female who is a primary care patient of Mikey College, NP. The CCM team was consulted for assistance with [employer].  LCSW completed CCM outreach attempt today but was unable to reach patient successfully. A HIPPA compliant voice message was left encouraging patient to return call once available. LCSW rescheduled CCM SW appointment as well.   Review of patient status, including review of consultants reports, other relevant assessments, and collaboration with appropriate care team members and the patient's provider was performed as part of comprehensive patient evaluation and provision of chronic care management services.     Follow Up Plan: SW will follow up with patient by phone next month.  Eula Fried, BSW, MSW, [email or web address] Phone: [phone]

## 2019-01-07 ENCOUNTER — Ambulatory Visit: Payer: Self-pay | Admitting: Family Medicine

## 2019-01-16 ENCOUNTER — Ambulatory Visit: Payer: Self-pay | Admitting: Pharmacist

## 2019-01-16 DIAGNOSIS — J449 Chronic obstructive pulmonary disease, unspecified: Secondary | ICD-10-CM

## 2019-01-16 NOTE — Chronic Care Management (AMB) (Signed)
  Care Management   Follow Up Note   01/16/2019 Name: Gina Wells MRN: [id number] DOB: 08/10/1960  Referred by: Mikey College, NP Reason for referral : Chronic Care Management (Patient Phone Call)   Gina Wells is a 58 y.o. year old female who is a primary care patient of Mikey College, NP. The care management team was consulted for assistance with care management and care coordination needs.  Gina Wells has a past medical history including but not limited to COPD, hypertension, GERD and hypothyroidism.  I reached out to Gina Wells by phone today.   Review of patient status, including review of consultants reports, relevant laboratory and other test results, and collaboration with appropriate care team members and the patient's provider was performed as part of comprehensive patient evaluation and provision of chronic care management services.    Goals Addressed            This Visit's Progress   . Medication Managment       Current Barriers:  . Low Vision . Financial Barriers  Pharmacist Clinical Goal(s):  Gina Wells Kitchen Over the next 30 days, patient will work with CM Pharmacist to address needs related to opitimization of medication managment and coordination of care  Interventions:  Counsel on importance of medication adherence  Gina Wells reports that she has had more shortness of breath over the past few weeks. Review patient's pulmonology medications from list from last Pulmonology visit note in chart, 12/25/18.   Gina Wells reports that since visit with her Pulmonologist, she has been using the ipratropium-albuterol nebulizer solution as needed as directed.   However, reports she stopped using the Anoro inhaler. Denies being instructed to stop the Anoro inhaler by provider. Anoro inhaler continues to be listed on latest medication list from Pulmonology. Advise patient to resume taking Anoro inhaler as directed.  Advise patient to  follow up with Pulmonlogist if breathing not improved with taking all pulmonary medications as prescribed  Reports that she stopped taking the lamotrigine prescribed by Neurology. Expresses that she decided to discontinue the lamotrigine without discussion with Neurologist as she no longer feels that the episodes that she has been having are seizures.   Patient denies completing ordered EEG procedure  Reports that went for procedure as originally scheduled, but that it could not be completed as she had product in her hair that needed to be removed.  Denies follow up to reschedule  Counsel patient on the importance of following up with Neurology to report self-discontinued lamotrigine and to reschedule EEG procedure  Address patient's financial concerns about upcoming cataract surgery - Advise patient to review email with resources sent to her by CM Social Worker on 12/19/18 and to call CM Social Worker with questions. Patient Self Care Activities:  . To self administers medications as prescribed . Attends all scheduled provider appointments . Calls pharmacy for medication refills . Calls provider office for new concerns or questions  Please see past updates related to this goal by clicking on the "Past Updates" button in the selected goal         Plan  The care management team will reach out to the patient again over the next 30 days.   Harlow Asa, PharmD, [place] [employer] [phone]

## 2019-01-16 NOTE — Patient Instructions (Signed)
Thank you allowing the Chronic Care Management Team to be a part of your care! It was a pleasure speaking with you today!     CCM (Chronic Care Management) Team    Janci Minor RN, BSN Nurse Care Coordinator  [phone]   Harlow Asa PharmD  Clinical Pharmacist  [phone]   Eula Fried LCSW Clinical Social Worker [phone]  Visit Information  Goals Addressed            This Visit's Progress   . Medication Managment       Current Barriers:  . Low Vision . Financial Barriers  Pharmacist Clinical Goal(s):  Marland Kitchen Over the next 30 days, patient will work with CM Pharmacist to address needs related to opitimization of medication managment and coordination of care  Interventions:  Counsel on importance of medication adherence  Ms. Kintzel reports that she has had more shortness of breath over the past few weeks. Review patient's pulmonology medications from list from last Pulmonology visit note in chart, 12/25/18.   Ms. Bouie reports that since visit with her Pulmonologist, she has been using the ipratropium-albuterol nebulizer solution as needed as directed.   However, reports she stopped using the Anoro inhaler. Denies being instructed to stop the Anoro inhaler by provider. Anoro inhaler continues to be listed on latest medication list from Pulmonology. Advise patient to resume taking Anoro inhaler as directed.  Advise patient to follow up with Pulmonlogist if breathing not improved with taking all pulmonary medications as prescribed  Reports that she stopped taking the lamotrigine prescribed by Neurology. Expresses that she decided to discontinue the lamotrigine without discussion with Neurologist as she no longer feels that the episodes that she has been having are seizures.   Patient denies completing ordered EEG procedure  Reports that went for procedure as originally scheduled, but that it could not be completed as she had product in her hair that  needed to be removed.  Denies follow up to reschedule  Counsel patient on the importance of following up with Neurology to report self-discontinued lamotrigine and to reschedule EEG procedure  Address patient's financial concerns about upcoming cataract surgery - Advise patient to review email with resources sent to her by CM Social Worker on 12/19/18 and to call CM Social Worker with questions. Patient Self Care Activities:  . To self administers medications as prescribed . Attends all scheduled provider appointments . Calls pharmacy for medication refills . Calls provider office for new concerns or questions  Please see past updates related to this goal by clicking on the "Past Updates" button in the selected goal         The patient verbalized understanding of instructions provided today and declined a print copy of patient instruction materials.   The care management team will reach out to the patient again over the next 30 days.   Harlow Asa, PharmD, [place] [employer] [phone]

## 2019-01-23 ENCOUNTER — Ambulatory Visit: Payer: Self-pay | Admitting: Licensed Clinical Social Worker

## 2019-01-23 ENCOUNTER — Other Ambulatory Visit: Payer: Self-pay

## 2019-01-23 DIAGNOSIS — F3130 Bipolar disorder, current episode depressed, mild or moderate severity, unspecified: Secondary | ICD-10-CM

## 2019-01-23 NOTE — Chronic Care Management (AMB) (Signed)
Care Management   Follow Up Note   01/23/2019 Name: Gina Wells MRN: [id number] DOB: [birth year]  Referred by: Galen ManilaKennedy, Lauren Renee, NP Reason for referral : Care Coordination   Gina BoysCharlotte D Hole is a 58 y.o. year old female who is a primary care patient of Galen ManilaKennedy, Lauren Renee, NP. The care management team was consulted for assistance with care management and care coordination needs.    Review of patient status, including review of consultants reports, relevant laboratory and other test results, and collaboration with appropriate care team members and the patient's provider was performed as part of comprehensive patient evaluation and provision of chronic care management services.    Goals Addressed    . I need financial support right now (pt-stated)       Current Barriers:  . Financial constraints . Limited social support . ADL IADL limitations . Lacks knowledge of community resource: available financial support resources as well as cataract surgery financial assistance resources  Clinical Social Work Clinical Goal(s):  Marland Kitchen. Over the next 90 days, client will work with SW to address concerns related to lack of financial stability  . Over the next 90 days, patient will work with LCSW to address needs related to need for cataract surgery by providing resource support and education  Interventions: . Patient interviewed and appropriate assessments performed . Provided patient with information about financial assistance resources available to her. Those resources were emailed out to her again on 01/23/2019. Patient is agreeable to contact [facility] [place] today. Association of Blind Citizens - Operates the Hewlett-Packardssistive Technology Fund, which provides funds to cover 50% of Morgan Stanleythe retail price of adaptive devices or software. Telephone: [phone] Website: www.blindcitizens.org Tax adviserCelebrate Sight - Coordinated by the Franklin Resourcesmerican Academy of Ophthalmology [employer](AAO) offering free  examinations and treatment for glaucoma to people who do not have medical insurance. Telephone: 1-800-391-EYES Website: YellowShoppers.itwww.aao.org/public/glaucoma/gl_2001.html Co-Pay Relief Program - part of the Patient Advocate Foundation, this service helps pay for the co-pays for pharmaceutical treatments such as Visudyne, Macugen and Lucentis. All patients who carry primary insurance (even Medicare) are eligible. Reimbursements are possible retroactively for up to a year. Telephone: [phone] Directory of Prescription Drug Patient Assistance Programs - Published by Pharmaceutical Research and Manufacturers of Ruben Gottronmerica, identifies company programs that provide prescription medications free of charge to physicians for their needy patients. Telephone: 1-800-PMA-INFO Address: [address], [address], [place] [phone] Directory available online at Newell Rubbermaidwww.FlashOptics.[email or web address]/patients EyeCare [place] - [facility], coordinated by the Franklin Resourcesmerican Academy of Ophthalmology (AAO), provides free and low-cost eye exams for U.S. citizens 3865 and older who have not had access to an ophthalmologist in the past three years. Telephone: 5-284-132-GMWN1-800-222-EYES Website: SatelliteStock.chwww.aao.org/public/pi/service/necp.htm [facility] - Provides assistance for eye surgery for people who are unable to pay or receive adequate assistance from current government agencies or similar sources. Telephone: [phone] ([phone] Website: http://www.king-russell.com/www.knightstemplar.org/ktef Address: [address], [address], [place] [phone] [facility] International - Provides financial assistance to individuals for eye care through local clubs. There are Guardian Life InsuranceLions Clubs in most localities, and services vary from club to club. Check your telephone book for the telephone number and address of your local organization. Telephone of Dover Corporationational Office: [phone] Website: www.lionsclub.org Address: [address], [place] [address] [facility] [place] -  Coordinated nationally by the Micron TechnologyVolunteer Eye Surgeons' Association providing free cataract surgery to people of all ages who have no other means to pay. Free eye examinations and surgeries are  scheduled annually on one day, usually in May. Telephone: 610-449-7237 New Eyes for the Needy - Provides vouchers for the purchase of new prescription eyeglasses. Telephone: 630-505-2816 Address: [address], [place] [address] . Rx Assist - Help in accessing pharmaceutical companies' patient assistance programs. Website: CarDumps.nl.cfm S.E.E. International - Free sight-restoring surgery for patients who are not eligible for any other social service programs. Referral required from a recognized social service organization. Telephone: (814) 249-7252/1-80020-TO-SEE (970)031-8166) Website: www.seeintl.org Address: [address], [address], [place] [address] Sight for Students - A Vision Service Plan (VSP) program in partnership with The [employer], providing eye exams and glasses to children 13 years and younger whose families cannot afford vision care. Telephone: 224-719-8824 Website: www.sightforstudents.org . Discussed plans with patient for ongoing care management follow up and provided patient with direct contact information for care management team . Advised patient to check email and review financial support resources that were sent out on 12/19/2018 and again on 01/23/2019. Education provided as well. . Assisted patient/caregiver with obtaining information about health plan benefits  . Collaborated with CCM team  Patient Self Care Activities:  . Attends all scheduled provider appointments . Calls provider office for new concerns or questions  Please see past updates related to this goal by clicking on the "Past Updates" button in the selected goal      The care management team will reach out to the patient again over the next 30 days.    Eula Fried, BSW, MSW, [email or web address] Phone: [phone]

## 2019-02-03 ENCOUNTER — Other Ambulatory Visit: Payer: Self-pay | Admitting: Pulmonary Disease

## 2019-02-03 DIAGNOSIS — R0602 Shortness of breath: Secondary | ICD-10-CM

## 2019-02-03 DIAGNOSIS — R0609 Other forms of dyspnea: Secondary | ICD-10-CM

## 2019-02-12 ENCOUNTER — Ambulatory Visit: Admission: RE | Admit: 2019-02-12 | Payer: BLUE CROSS/BLUE SHIELD | Source: Ambulatory Visit

## 2019-02-15 ENCOUNTER — Other Ambulatory Visit: Payer: Self-pay | Admitting: Family Medicine

## 2019-02-15 DIAGNOSIS — J432 Centrilobular emphysema: Secondary | ICD-10-CM

## 2019-02-18 ENCOUNTER — Telehealth: Payer: Self-pay

## 2019-02-19 ENCOUNTER — Ambulatory Visit: Payer: Self-pay | Admitting: Pharmacist

## 2019-02-19 DIAGNOSIS — J432 Centrilobular emphysema: Secondary | ICD-10-CM

## 2019-02-19 DIAGNOSIS — E890 Postprocedural hypothyroidism: Secondary | ICD-10-CM

## 2019-02-19 NOTE — Chronic Care Management (AMB) (Signed)
  Care Management   Follow Up Note   02/19/2019 Name: Gina Wells MRN: [id number] DOB: 1960/08/01  Referred by: Mikey College, NP Reason for referral : Chronic Care Management (Patient Phone Call)   Gina Wells is a 58 y.o. year old female who is a primary care patient of Mikey College, NP. The care management team was consulted for assistance with care management and care coordination needs.  Gina Wells has a past medical history including but not limited to COPD, hypertension, GERD and hypothyroidism.  Review of patient status, including review of consultants reports, relevant laboratory and other test results, and collaboration with appropriate care team members and the patient's provider was performed as part of comprehensive patient evaluation and provision of chronic care management services.    Goals Addressed            This Visit's Progress   . Medication Managment       Current Barriers:  . Low Vision . Financial Barriers  Pharmacist Clinical Goal(s):  Marland Kitchen Over the next 30 days, patient will work with CM Pharmacist to address needs related to opitimization of medication managment and coordination of care  Interventions:  Counsel on importance of medication adherence  Gina Wells confirms that she has been using her maintenance inhaler on a scheduled basis as directed and rescue medications as needed as directed.  Reports that her COPD symptoms have been controlled with these medications  Counsel on smoking cessation  Reports that her pulmonologist again advised her to attempt smoking cessation and started her on Chantix.  Reports improvement in nicotine cravings  Counsel patient about [place] Quitline as resource  Patient confirms receiving counseling from [facility] when she picked up Chantix, including about risk of neuropsychiatric effects with Chantix.   Reports that she has not increased her Chantix dose beyond 0.5 mg daily  due to concern about this risk given her history of depression. Denies having observed herself having any side effects from the medication, including any effect on mood.  Advise patient to follow up directly with her pulmonologist regarding concern about the Chantix   Gina Wells notes that she has only ~1.5 week supply of levothyroxine remaining and is out of refills.   Note patient last seen by Dr. Parks Ranger in June for lab work and follow up of hypothyroidism.   Patient advised to follow up in 2-3 months  Will follow up with PCP to request that patient be contacted by office for scheduling for follow up appointment and labs  Patient Self Care Activities:  . To self administers medications as prescribed . Attends all scheduled provider appointments . Calls pharmacy for medication refills . Calls provider office for new concerns or questions  Please see past updates related to this goal by clicking on the "Past Updates" button in the selected goal        Plan   1) Patient to follow up with Pulmonologist regarding current smoking cessation therapy 2) The patient has been provided with contact information for the care management team and has been advised to call with any health related questions or concerns.   Gina Wells, PharmD, [place] [employer] [phone]

## 2019-02-19 NOTE — Patient Instructions (Signed)
Thank you allowing the Care Management Team to be a part of your care! It was a pleasure speaking with you today!     Care Management Team    Janci Minor RN, BSN Nurse Care Coordinator  [phone]   Harlow Asa PharmD  Clinical Pharmacist  ([place] LCSW Clinical Social Worker [phone]   Visit Information  Goals Addressed            This Visit's Progress   . Medication Managment       Current Barriers:  . Low Vision . Financial Barriers  Pharmacist Clinical Goal(s):  Marland Kitchen Over the next 30 days, patient will work with CM Pharmacist to address needs related to opitimization of medication managment and coordination of care  Interventions:  Counsel on importance of medication adherence  Ms. Bloodsworth confirms that she has been using her maintenance inhaler on a scheduled basis as directed and rescue medications as needed as directed.  Reports that her COPD symptoms have been controlled with these medications  Counsel on smoking cessation  Reports that her pulmonologist again advised her to attempt smoking cessation and started her on Chantix.  Reports improvement in nicotine cravings  Counsel patient about [place] Quitline as resource  Patient confirms receiving [facility] when she picked up Chantix, including about risk of neuropsychiatric effects with Chantix.   Reports that she has not increased her Chantix dose beyond 0.5 mg daily due to concern about this risk given her history of depression. Denies having observed herself having any side effects from the medication, including any effect on mood.  Advise patient to follow up directly with her pulmonologist regarding concern about the Chantix   Ms. Tapscott notes that she has only ~1.5 week supply of levothyroxine remaining and is out of refills.   Note patient last seen by Dr. Parks Ranger in June for lab work and follow up of hypothyroidism.   Patient advised to  follow up in 2-3 months  Will follow up with PCP to request that patient be contacted by office for scheduling for follow up appointment and labs  Patient Self Care Activities:  . To self administers medications as prescribed . Attends all scheduled provider appointments . Calls pharmacy for medication refills . Calls provider office for new concerns or questions  Please see past updates related to this goal by clicking on the "Past Updates" button in the selected goal         The patient verbalized understanding of instructions provided today and declined a print copy of patient instruction materials.   1) Patient to follow up with Pulmonologist regarding current smoking cessation therapy 2) The patient has been provided with contact information for the care management team and has been advised to call with any health related questions or concerns.   Harlow Asa, PharmD, [place] [employer] [phone]

## 2019-02-22 ENCOUNTER — Other Ambulatory Visit: Payer: Self-pay | Admitting: Family Medicine

## 2019-02-22 DIAGNOSIS — E890 Postprocedural hypothyroidism: Secondary | ICD-10-CM

## 2019-02-26 ENCOUNTER — Other Ambulatory Visit: Payer: Self-pay

## 2019-02-27 ENCOUNTER — Telehealth: Payer: Self-pay

## 2019-02-28 ENCOUNTER — Telehealth: Payer: Self-pay

## 2019-02-28 NOTE — Telephone Encounter (Signed)
-----   Message from Mikey College, NP sent at 02/20/2019  9:39 AM EDT ----- Regarding: FW:  Schedule appt please.  I can send an additional fill of levothyroxine to get her to the appointment. ----- Message ----- From: Vella Raring, [facility] Sent: 02/19/2019  11:40 PM EDT To: Mikey College, NP  Ander Purpura,  Ms. Trillo was seen in the office by Dr. Parks Ranger in June for follow up lab work and appointment for hypothyroidism management. In his note, he instructed that Ms. Cafarella was to follow up in 2-3 months for appointment and TSH level. When I spoke with her today, Ms. Guizar reported that she had only ~1.5 weeks of levothyroxine remaining.   Would you please have the office contact Ms. Onder for her next follow up appointment/lab work within that time?  Thank you!  Harlow Asa, PharmD, [place] [employer] [phone]

## 2019-02-28 NOTE — Telephone Encounter (Signed)
I spoke with the patient and schedule an appt for Tuesday @ 8:40am.

## 2019-03-04 ENCOUNTER — Ambulatory Visit: Payer: Self-pay | Admitting: Nurse Practitioner

## 2019-03-04 ENCOUNTER — Ambulatory Visit: Payer: Self-pay | Admitting: Licensed Clinical Social Worker

## 2019-03-04 DIAGNOSIS — F3130 Bipolar disorder, current episode depressed, mild or moderate severity, unspecified: Secondary | ICD-10-CM

## 2019-03-04 NOTE — Chronic Care Management (AMB) (Signed)
Chronic Care Management    Clinical Social Work Follow Up Note  03/04/2019 Name: ERMINIA MCNEW MRN: [id number] DOB: [birth year]  DARIANNE MURALLES is a 58 y.o. year old female who is a primary care patient of Mikey College, NP. The CCM team was consulted for assistance with [employer] .   Review of patient status, including review of consultants reports, other relevant assessments, and collaboration with appropriate care team members and the patient's provider was performed as part of comprehensive patient evaluation and provision of chronic care management services.     Goals Addressed    . I need financial support right now (pt-stated)       Current Barriers:  . Financial constraints . Limited social support . ADL IADL limitations . Lacks knowledge of community resource: available financial support resources as well as cataract surgery financial assistance resources  Clinical Social Work Clinical Goal(s):  Marland Kitchen Over the next 90 days, client will work with SW to address concerns related to lack of financial stability  . Over the next 90 days, patient will work with LCSW to address needs related to need for cataract surgery by providing resource support and education  Interventions: . Patient interviewed and appropriate assessments performed . Provided patient with information about financial assistance resources available to her. Those resources were emailed out to her again on 01/23/2019. Patient is agreeable to contact [facility] today. Association of Blind Citizens - Operates the [employer], which provides funds to cover 50% of H[employer]el Foods of adaptive devices or software. Telephone: 437-147-5815 Website: www.blindcitizens.org Psychiatrist - Coordinated by the Energy East Corporation of Ophthalmology [employer]ee) offering free examinations and treatment for glaucoma to people who do not have medical insurance. Telephone: 1-800-391-EYES  Website: MetroRank.no [place] - part of the Patient [place], this service helps pay for the co-pays for pharmaceutical treatments such as Visudyne, Macugen and Lucentis. All patients who carry primary insurance (even Medicare) are eligible. Reimbursements are possible retroactively for up to a year. Telephone: [phone] Directory of Prescription Drug Patient Assistance Programs - Published by Pharmaceutical Research and Manufacturers of Lamar Benes, identifies company programs that provide prescription medications free of charge to physicians for their needy patients. Telephone: 1-800-PMA-INFO Address: [address], [address], [place] [address] Directory available online at Danaher Corporation.PlannerBlog.com.cy [place], coordinated by the Energy East Corporation of Ophthalmology (AAO), provides free and low-cost eye exams for U.S. citizens 47 and older who have not had access to an ophthalmologist in the past three years. Telephone: 6-144-315-QMGQ Website: DyeTool.be [place] assistance for eye surgery for people who are unable to pay or receive adequate assistance from current government agencies or similar sources. Telephone: (408) 511-3158 Website: CartCleaning.hu Address: [address], [address], [place] [address] [facility] International - Provides financial assistance to individuals for eye care through local clubs. There are Continental Airlines in [place], and services vary from club to club. Check your telephone book for the telephone number and address of your local organization. Telephone of Reliant Energy: 9132859368 Website: www.lionsclub.org Address: [place], [place] [address] [facility] [place] - Coordinated nationally by the Best [employer]' Association providing free cataract surgery to people of  all ages who have no other means to pay. Free eye examinations and surgeries are scheduled annually on one day, usually in May. Telephone: (704) 659-0569 New Eyes for the Needy - Provides  vouchers for the purchase of new prescription eyeglasses. Telephone: [phone] Address: [address], [address], [place] [phone] . Rx Assist - Help in accessing pharmaceutical companies' patient assistance programs. Website: HoneyForum.co.nzwww.rxassist.org/Search.cfm S.E.E. International - Free sight-restoring surgery for patients who are not eligible for any other social service programs. Referral required from a recognized social service organization. Telephone: (762) 513-4973(805) 6504861559/1-80020-TO-SEE [phone]([phone]) Website: www.seeintl.org Address: [address], [address], [place] [phone] Sight for Students - A Vision Service Plan (VSP) program in partnership with The NCR CorporationEntertainment Industry Foundation, providing eye exams and glasses to children 18 years and younger whose families cannot afford vision care. Telephone: (270)[phone] Website: www.sightforstudents.org Patient reported that she investigated these resources listed above and she was told she was not eligible. LCSW provided local eye support resources and encouraged patient to contact [place] Social Worker of the Blind for further assistance. Email sent to patient on 03/04/19 with new resources.  . Discussed plans with patient for ongoing care management follow up and provided patient with direct contact information for care management team . Advised patient to check email and review financial support resources that were sent out again today. Education provided as well. . Assisted patient/caregiver with obtaining information about health plan benefits  . Collaborated with CCM team  Patient Self Care Activities:  . Attends all scheduled provider appointments . Calls provider office for new concerns or questions  Please see past updates related to this  goal by clicking on the "Past Updates" button in the selected goal      Follow Up Plan: SW will follow up with patient by phone over the next quarter  Dickie LaBrooke Jodee Wagenaar, [place]W, MSW, LCSW [facility] [place]  [facility] [email or web address] Phone: [phone]

## 2019-03-05 ENCOUNTER — Encounter: Payer: Self-pay | Admitting: Nurse Practitioner

## 2019-03-05 ENCOUNTER — Ambulatory Visit (INDEPENDENT_AMBULATORY_CARE_PROVIDER_SITE_OTHER): Payer: Self-pay | Admitting: Nurse Practitioner

## 2019-03-05 ENCOUNTER — Other Ambulatory Visit: Payer: Self-pay

## 2019-03-05 VITALS — BP 130/59 | HR 78 | Ht 64.0 in | Wt 192.2 lb

## 2019-03-05 DIAGNOSIS — R7309 Other abnormal glucose: Secondary | ICD-10-CM

## 2019-03-05 DIAGNOSIS — G4440 Drug-induced headache, not elsewhere classified, not intractable: Secondary | ICD-10-CM

## 2019-03-05 DIAGNOSIS — R635 Abnormal weight gain: Secondary | ICD-10-CM

## 2019-03-05 DIAGNOSIS — J418 Mixed simple and mucopurulent chronic bronchitis: Secondary | ICD-10-CM

## 2019-03-05 DIAGNOSIS — L02611 Cutaneous abscess of right foot: Secondary | ICD-10-CM

## 2019-03-05 DIAGNOSIS — E890 Postprocedural hypothyroidism: Secondary | ICD-10-CM

## 2019-03-05 MED ORDER — MUPIROCIN 2 % EX OINT: 1.0000 "application " | TOPICAL_OINTMENT | Freq: Two times a day (BID) | CUTANEOUS | 1 refills | Status: AC

## 2019-03-05 NOTE — Patient Instructions (Addendum)
Gina Wells,   Thank you for coming in to clinic today.  1. Labs today 2. Rehab for lungs (pulmonary rehab) will call you.  Ask about cost before you schedule. 3. Continue current Euthyrox until labs come back.  4. Work to slowly decrease BC powders to help the morning/afternoon headaches. You probably have rebound headaches.  5. COPD -  Take anoro about 5-10 minutes after your nebulizer in the mornings.  6. Great work on cutting back cigarettes!  Please schedule a follow-up appointment with Cassell Smiles, AGNP. Return in about 3 months (around 06/05/2019) for diabetes, thyroid.  If you have any other questions or concerns, please feel free to call the clinic or send a message through [place]. You may also schedule an earlier appointment if necessary.  You will receive a survey after today's visit either digitally by e-mail or paper by [employer]. Your experiences and feedback matter to [place].  Please respond so we know how we are doing as we provide care for you.   Cassell Smiles, DNP, AGNP-BC Adult Gerontology Nurse Practitioner [facility], [facility]    Analgesic Rebound Headache An analgesic rebound headache, sometimes called a medication overuse headache, is a headache that comes after pain medicine (analgesic) taken to treat the original (primary) headache has worn off. Any type of primary headache can return as a rebound headache if a person regularly takes analgesics more than three times a week to treat it. The types of primary headaches that are commonly associated with rebound headaches include:  Migraines.  Headaches that arise from tense muscles in the head and neck area (tension headaches).  Headaches that develop and happen again (recur) on one side of the head and around the eye (cluster headaches). If rebound headaches continue, they become chronic daily headaches. What are the causes? This condition may be caused by frequent use  of:  Over-the-counter medicines such as aspirin, ibuprofen, and acetaminophen.  Sinus relief medicines and other medicines that contain caffeine.  Narcotic pain medicines such as codeine and oxycodone. What are the signs or symptoms? The symptoms of a rebound headache are the same as the symptoms of the original headache. Some of the symptoms of specific types of headaches include: Migraine headache  Pulsing or throbbing pain on one or both sides of the head.  Severe pain that interferes with daily activities.  Pain that is worsened by physical activity.  Nausea, vomiting, or both.  Pain with exposure to bright light, loud noises, or strong smells.  General sensitivity to bright light, loud noises, or strong smells.  Visual changes.  Numbness of one or both arms. Tension headache  Pressure around the head.  Dull, aching head pain.  Pain felt over the front and sides of the head.  Tenderness in the muscles of the head, neck, and shoulders. Cluster headache  Severe pain that begins in or around one eye or temple.  Redness and tearing in the eye on the same side as the pain.  Droopy or swollen eyelid.  One-sided head pain.  Nausea.  Runny nose.  Sweaty, pale facial skin.  Restlessness. How is this diagnosed? This condition is diagnosed by:  Reviewing your medical history. This includes the nature of your primary headaches.  Reviewing the types of pain medicines that you have been using to treat your headaches and how often you take them. How is this treated? This condition may be treated or managed by:  Discontinuing frequent use of the analgesic medicine.  Doing this may worsen your headaches at first, but the pain should eventually become more manageable, less frequent, and less severe.  Seeing a headache specialist. He or she may be able to help you manage your headaches and help make sure there is not another cause of the headaches.  Using methods of  stress relief, such as acupuncture, counseling, biofeedback, and massage. Talk with your health care provider about which methods might be good for you. Follow these instructions at home:  Take over-the-counter and prescription medicines only as told by your health care provider.  Stop the repeated use of pain medicine as told by your health care provider. Stopping can be difficult. Carefully follow instructions from your health care provider.  Avoid triggers that are known to cause your primary headaches.  Keep all follow-up visits as told by your health care provider. This is important. Contact a health care provider if:  You continue to experience headaches after following treatments that your health care provider recommended. Get help right away if:  You develop new headache pain.  You develop headache pain that is different than what you have experienced in the past.  You develop numbness or tingling in your arms or legs.  You develop changes in your speech or vision. This information is not intended to replace advice given to you by your health care provider. Make sure you discuss any questions you have with your health care provider. Document Released: 09/30/2003 Document Revised: 06/22/2017 Document Reviewed: 12/13/2015 Elsevier Patient Education  2020 [employer]

## 2019-03-05 NOTE — Progress Notes (Signed)
Subjective:    Patient ID: Gina Wells, female    DOB: Dec 08, 1960, 58 y.o.   MRN: [id number]  Gina Wells is a 58 y.o. female presenting on 03/05/2019 for Hypothyroidism (pt concern about weight gain )   HPI Hypertension - She is not checking BP at home or outside of clinic.    - Current medications: hctz 25 mg once daily, tolerating well without side effects - She is not currently symptomatic. - Pt denies headache, lightheadedness, dizziness, changes in vision, chest tightness/pressure, palpitations, leg swelling, sudden loss of speech or loss of consciousness. - She  reports no regular exercise routine. - Limited by shortness of breath - Her diet is moderate in salt, moderate in fat, and moderate in carbohydrates.   Hypothyroidism, weight gain Patient has had weight gain as primary symptom recently.  She takes eythyrox 112 mcg once daily. Last TSH June showed normal labs after dose increase from 100-112 mcg. Gets full quickly, gets tired when she eats.  Is not eating more often despite reduced intake at meals.  Eats 1-2 times per day.  Patient is able to eat a little over 1/2 of her meal (meat and 2 veggies).  Is not eating snacks.   COPD Less active due to difficulty breathing. Patient has been seen recently for this problem at [facility].  Is using albuterol and anoro, nebulizer.  Patient takes nebulizer in am, Anoro in afternoon, and albuterol only prn.  When active, patient has to stop to take breaks.  Can't get air in well enough and never feels like she can get a full breath in.  Notes her PFT in office at [facility] pulmonology showed "54% lung capacity." - Is working to quit smoking and has improved.  Patient is continuing to take Chantix.  Prior 1 ppd.  Now 1 pack every 3-4 days.    Headache Patient notes headache every morning when she wakes up.  Has never been told she stops breathing during her sleep, is not waking gasping.  Patient notes she takes  [facility] powder as the only thing that helps.  She takes a packet 2-3 times daily.  She reports no n/v, coffee ground emesis, dark/black/tarry stool, BRBPR, or other GI bleeding. Continues to take omeprazole for GERD and reports no stomach difficulty with BC powder use at this time.  Social History   Tobacco Use  . Smoking status: Current Every Day Smoker    Packs/day: 0.35    Years: 50.00    Pack years: 17.50    Types: Cigarettes    Start date: 07/25/1975  . Smokeless tobacco: Never Used  Substance Use Topics  . Alcohol use: No    Alcohol/week: 0.0 standard drinks  . Drug use: No    Review of Systems Per HPI unless specifically indicated above     Objective:    BP (!) 130/59 (BP Location: Left Arm, Patient Position: Sitting, Cuff Size: Normal)   Pulse 78   Ht 5\' 4"  (1.626 m)   Wt 192 lb 3.2 oz (87.2 kg)   BMI 32.99 kg/m   Wt Readings from Last 3 Encounters:  03/05/19 192 lb 3.2 oz (87.2 kg)  07/11/18 178 lb (80.7 kg)  05/19/18 174 lb 13.2 oz (79.3 kg)    Physical Exam Vitals signs reviewed.  Constitutional:      General: She is not in acute distress.    Appearance: She is well-developed.  HENT:     Head: Normocephalic and atraumatic.  Cardiovascular:     Rate and Rhythm: Normal rate and regular rhythm.     Pulses:          Radial pulses are 2+ on the right side and 2+ on the left side.       Posterior tibial pulses are 1+ on the right side and 1+ on the left side.     Heart sounds: Normal heart sounds, S1 normal and S2 normal.  Pulmonary:     Effort: Pulmonary effort is normal. No respiratory distress.     Breath sounds: Normal breath sounds. Decreased air movement present. No wheezing or rhonchi.  Musculoskeletal:     Right lower leg: No edema.     Left lower leg: No edema.       Feet:  Skin:    General: Skin is warm and dry.     Capillary Refill: Capillary refill takes less than 2 seconds.  Neurological:     Mental Status: She is alert and oriented to person,  place, and time.     Comments: Fine tremor noted R>L hands  Psychiatric:        Attention and Perception: Attention normal.        Mood and Affect: Affect normal. Mood is anxious.        Behavior: Behavior normal. Behavior is cooperative.    Results for orders placed or performed in visit on 12/30/18  T4, free  Result Value Ref Range   Free T4 1.4 0.8 - 1.8 ng/dL  TSH  Result Value Ref Range   TSH 1.02 0.40 - 4.50 mIU/L      Assessment & Plan:   Problem List Items Addressed This Visit      Endocrine   Postablative hypothyroidism - Primary Status in June stable on labs after dose increase from 100 - 112 mcg once daily.  Patient continues brand Euthyrox 112 mcg daily in am.  Weight gain is clinical concern for symptomatic hypothyroidism, but could have alternative causes. - recheck labs - Continue med without changes today - Follow-up 3 months.   Relevant Orders   TSH   T4, free    Other Visit Diagnoses    Weight gain     Multiple possible causes with approx 15 lbs in last 8 months gained.  Patient notes decreased activity due to shortness of breath, COPD.  Past A1c = 6.5%  No treatment for diabetes to date.  Plan: - Recheck labs - Encouraged patient to eat at least [phone] calories per day, may need more.  Limit to less than 1600 calories daily for weight loss - Encouraged increasing physical activity. - Follow-up prn after labs.   Relevant Orders   COMPLETE METABOLIC PANEL WITH GFR   Hemoglobin A1c   TSH   T4, free   Elevated glucose     Recheck labs- Diabetes status likely.  Discuss medications after labs.  May need sooner follow-up.  Repeat labs 3 months.   Relevant Orders   COMPLETE METABOLIC PANEL WITH GFR   Hemoglobin A1c   Mixed simple and mucopurulent chronic bronchitis (HCC)     Patient with mild COPD, but with significant functional impacts.  Patient unable to maintain active lifestyle for completing housework and regular chores.   - START pulmonary  rehab.  Cost is concern for patient, so please call to discuss prior to scheduling. - Continue meds.  Take Anoro 5-10 minutes after am nebulizer.  Continue albuterol prn. 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  Uses 3 x per day.  No GI upset/bleeding at this time.  - Continue omeprazole - Encouraged to stop using BC powder.  Consider using 81 mg aspirin to reduce dose and delay onset of action during withdrawal period.  Then should stop all medication unless is truly taken prn. - Follow-up prn 2-4 weeks.  Cutaneous abscess of right foot     Skin infection consistent with possible MRSA boil.  Not causing cellulitis.  Only affects patient in summer per report.  No current complications. - START mupirocin bid x 5 days, repeat prn for new abscesses. - Follow-up if no improvement.   Relevant Medications   mupirocin ointment (BACTROBAN) 2 %      Meds ordered this encounter  Medications  . mupirocin ointment (BACTROBAN) 2 %    Sig: Apply 1 application topically 2 (two) times daily for 5 days. May repeat on foot blisters as needed.    Dispense:  22 g    Refill:  1    Order Specific Question:   Supervising Provider    Answer:   Smitty CordsKARAMALEGOS, ALEXANDER J [[id number]]    Follow up plan: Return in about 3 months (around 06/05/2019) for diabetes, thyroid.  Wilhelmina McardleLauren Prithvi Kooi, DNP, AGPCNP-BC Adult Gerontology Primary Care Nurse Practitioner [facility] 03/05/2019, 11:02 AM

## 2019-03-06 LAB — COMPLETE METABOLIC PANEL WITH GFR
AG Ratio: 1.3 (calc) (ref 1.0–2.5)
ALT: 19 U/L (ref 6–29)
AST: 18 U/L (ref 10–35)
Albumin: 3.8 g/dL (ref 3.6–5.1)
Alkaline phosphatase (APISO): 102 U/L (ref 37–153)
BUN: 15 mg/dL (ref 7–25)
CO2: 26 mmol/L (ref 20–32)
Calcium: 9.8 mg/dL (ref 8.6–10.4)
Chloride: 106 mmol/L (ref 98–110)
Creat: 0.83 mg/dL (ref 0.50–1.05)
GFR, Est African American: 91 mL/min/{1.73_m2} (ref >=60)
GFR, Est Non African American: 78 mL/min/{1.73_m2} (ref >=60)
Globulin: 2.9 g/dL (calc) (ref 1.9–3.7)
Glucose, Bld: 96 mg/dL (ref 65–99)
Potassium: 4.2 mmol/L (ref 3.5–5.3)
Sodium: 142 mmol/L (ref 135–146)
Total Bilirubin: 0.5 mg/dL (ref 0.2–1.2)
Total Protein: 6.7 g/dL (ref 6.1–8.1)

## 2019-03-06 LAB — HEMOGLOBIN A1C
Hgb A1c MFr Bld: 6.3 % of total Hgb — ABNORMAL HIGH (ref <5.7)
Mean Plasma Glucose: 134 (calc)
eAG (mmol/L): 7.4 (calc)

## 2019-03-06 LAB — SPECIMEN COMPROMISED

## 2019-03-06 LAB — TSH: TSH: 0.41 mIU/L (ref 0.40–4.50)

## 2019-03-06 LAB — T4, FREE: Free T4: 1.6 ng/dL (ref 0.8–1.8)

## 2019-03-27 ENCOUNTER — Ambulatory Visit: Payer: Self-pay | Admitting: Licensed Clinical Social Worker

## 2019-03-27 ENCOUNTER — Telehealth: Payer: Self-pay

## 2019-03-27 NOTE — Chronic Care Management (AMB) (Signed)
  Care Management   Follow Up Note   03/27/2019 Name: OWEN PAGNOTTA MRN: [id number] DOB: 02-12-1961  Referred by: Mikey College, NP Reason for referral : Care Coordination   KERITH SHERLEY is a 58 y.o. year old female who is a primary care patient of Mikey College, NP. The care management team was consulted for assistance with care management and care coordination needs.    Review of patient status, including review of consultants reports, relevant laboratory and other test results, and collaboration with appropriate care team members and the patient's provider was performed as part of comprehensive patient evaluation and provision of chronic care management services.    LCSW completed CCM outreach attempt today but was unable to reach patient successfully. A HIPPA compliant voice message was left encouraging patient to return call once available. LCSW rescheduled CCM SW appointment as well.  A HIPPA compliant phone message was left for the patient providing contact information and requesting a return call.   Eula Fried, BSW, MSW, [email or web address] Phone: [phone]

## 2019-04-09 ENCOUNTER — Other Ambulatory Visit: Payer: Self-pay | Admitting: Nurse Practitioner

## 2019-04-09 DIAGNOSIS — Z716 Tobacco abuse counseling: Secondary | ICD-10-CM

## 2019-04-09 NOTE — Telephone Encounter (Signed)
Pt.called requesting refill on Chantix called into [facility]. Pt call back  # [phone]

## 2019-04-10 MED ORDER — CHANTIX 0.5 MG PO TABS: 0.5000 mg | ORAL_TABLET | Freq: Two times a day (BID) | ORAL | 0 refills | Status: DC

## 2019-05-22 ENCOUNTER — Telehealth: Payer: Self-pay

## 2019-05-22 ENCOUNTER — Ambulatory Visit: Payer: Self-pay | Admitting: Licensed Clinical Social Worker

## 2019-05-22 NOTE — Chronic Care Management (AMB) (Addendum)
  Care Management   Follow Up Note   05/22/2019 Name: [name] MRN: [id number] DOB: 1960-11-17  Referred by: Mikey College, NP Reason for referral : Care Coordination   Gina Wells is a 58 y.o. year old female who is a primary care patient of Mikey College, NP. The care management team was consulted for assistance with care management and care coordination needs.    Review of patient status, including review of consultants reports, relevant laboratory and other test results, and collaboration with appropriate care team members and the patient's provider was performed as part of comprehensive patient evaluation and provision of chronic care management services.    LCSW completed CCM outreach attempt today but was unable to reach patient successfully. A HIPPA compliant voice message was left encouraging patient to return call once available. LCSW rescheduled CCM SW appointment as well.  A HIPPA compliant phone message was left for the patient providing contact information and requesting a return call.   Eula Fried, BSW, MSW, [email or web address] Phone: [phone]

## 2019-06-23 ENCOUNTER — Other Ambulatory Visit: Payer: Self-pay

## 2019-06-23 ENCOUNTER — Encounter: Payer: Self-pay | Admitting: Family Medicine

## 2019-06-23 ENCOUNTER — Ambulatory Visit (INDEPENDENT_AMBULATORY_CARE_PROVIDER_SITE_OTHER): Payer: BLUE CROSS/BLUE SHIELD | Admitting: Family Medicine

## 2019-06-23 ENCOUNTER — Ambulatory Visit: Payer: Self-pay | Admitting: Family Medicine

## 2019-06-23 VITALS — BP 171/85 | HR 68 | Temp 98.4°F | Resp 20 | Ht 64.0 in | Wt 202.4 lb

## 2019-06-23 DIAGNOSIS — L509 Urticaria, unspecified: Secondary | ICD-10-CM | POA: Diagnosis not present

## 2019-06-23 DIAGNOSIS — R609 Edema, unspecified: Secondary | ICD-10-CM | POA: Diagnosis not present

## 2019-06-23 DIAGNOSIS — T7840XA Allergy, unspecified, initial encounter: Secondary | ICD-10-CM

## 2019-06-23 MED ORDER — HYDROXYZINE HCL 10 MG PO TABS: 10.0000 mg | ORAL_TABLET | Freq: Three times a day (TID) | ORAL | 1 refills | Status: DC | PRN

## 2019-06-23 MED ORDER — FUROSEMIDE 20 MG PO TABS: 20.0000 mg | ORAL_TABLET | Freq: Every day | ORAL | 0 refills | Status: DC | PRN

## 2019-06-23 MED ORDER — PREDNISONE 20 MG PO TABS: ORAL_TABLET | ORAL | 0 refills | Status: DC

## 2019-06-23 NOTE — Progress Notes (Signed)
Subjective:    Patient ID: Gina Wells, female    DOB: 23-Aug-1960, 58 y.o.   MRN: [id number]  Gina Wells is a 58 y.o. female presenting on 06/23/2019 for Rash (Itchy, red blotchy rash all over the body x 2weeks. Pt state she had the same rash x3-4 mths ago on her hands and arms but it subsided. She thought at the time she was just allergic to something.   ) and Angioedema (pt complains of possible retention fluid all over.  She also concern about weight gain that she contributes to her swelling.  She state the swelling is mostly on her Right side.)  Previous PCP Wilhelmina McardleLauren Kennedy, AGPCNP-BC (no longer at our office)  HPI   Urticarial Rash / Generalized Swelling Reports onset of symptoms few weeks ago but did not seek care. She was not sure any cause of the symptoms or any known allergic trigger. - Taking HCTZ 25mg  daily, says it isn't helping get rid of any fluid Admits generalized itching as well, not tried any OTC medication - Additionally has [facility], but cannot return due to cost. She has inhalers questions if they are working or not, she had prior CT, ECHO and PFTs completed in 2020. Admits some fatigue chronic issue Admits some chronic dyspnea episodes Denies any fevers chills sweats, nausea vomiting diarrhea, abdominal pain,. Headache near syncope   Dep[facility] 12/24/2018 11/19/2018 07/11/2018  Decreased Interest 0 1 0  Down, Depressed, Hopeless 0 0 0  PHQ - 2 Score 0 1 0  Altered sleeping 0 3 1  Tired, decreased energy 0 3 0  Change in appetite 0 3 0  Feeling bad or failure about yourself  0 0 0  Trouble concentrating 0 1 0  Moving slowly or fidgety/restless 0 0 0  Suicidal thoughts 0 0 0  PHQ-9 Score 0 11 1  Difficult doing work/chores Not difficult at all Not difficult at all Not difficult at all    Social History   Tobacco Use  . Smoking status: Current Every Day Smoker    Packs/day: 0.25    Years: 50.00    Pack years:  12.50    Types: Cigarettes    Start date: 07/25/1975  . Smokeless tobacco: Never Used  Substance Use Topics  . Alcohol use: No    Alcohol/week: 0.0 standard drinks  . Drug use: No    Review of Systems Per HPI unless specifically indicated above     Objective:    BP (!) 171/85 (BP Location: Right Arm, Patient Position: Sitting, Cuff Size: Large)   Pulse 68   Temp 98.4 F (36.9 C) (Oral)   Resp 20   Ht 5\' 4"  (1.626 m)   Wt 202 lb 6.4 oz (91.8 kg)   SpO2 100%   BMI 34.74 kg/m   Wt Readings from Last 3 Encounters:  06/23/19 202 lb 6.4 oz (91.8 kg)  03/05/19 192 lb 3.2 oz (87.2 kg)  07/11/18 178 lb (80.7 kg)    Physical Exam Vitals signs and nursing note reviewed.  Constitutional:      General: She is not in acute distress.    Appearance: She is well-developed. She is not diaphoretic.     Comments: Well-appearing, comfortable, cooperative  HENT:     Head: Normocephalic and atraumatic.  Eyes:     General:        Right eye: No discharge.        Left eye: No discharge.  Conjunctiva/sclera: Conjunctivae normal.  Neck:     Musculoskeletal: Normal range of motion and neck supple.     Thyroid: No thyromegaly.  Cardiovascular:     Rate and Rhythm: Normal rate and regular rhythm.     Heart sounds: Normal heart sounds. No murmur.  Pulmonary:     Effort: Pulmonary effort is normal. No respiratory distress.     Breath sounds: Normal breath sounds. No wheezing or rales.     Comments: Good air movement Musculoskeletal: Normal range of motion.  Lymphadenopathy:     Cervical: No cervical adenopathy.  Skin:    General: Skin is warm and dry.     Findings: Rash (generalized urticarial rash extremities trunk) present. No erythema.  Neurological:     Mental Status: She is alert and oriented to person, place, and time.  Psychiatric:        Behavior: Behavior normal.     Comments: Well groomed, good eye contact, normal speech and thoughts    Results for orders placed or  performed in visit on 03/05/19  COMPLETE METABOLIC PANEL WITH GFR  Result Value Ref Range   Glucose, Bld 96 65 - 99 mg/dL   BUN 15 7 - 25 mg/dL   Creat 0.83 0.50 - 1.05 mg/dL   GFR, Est Non African American 78 > OR = 60 mL/min/1.3m2   GFR, Est African American 91 > OR = 60 mL/min/1.7m2   BUN/Creatinine Ratio NOT APPLICABLE 6 - 22 (calc)   Sodium 142 135 - 146 mmol/L   Potassium 4.2 3.5 - 5.3 mmol/L   Chloride 106 98 - 110 mmol/L   CO2 26 20 - 32 mmol/L   Calcium 9.8 8.6 - 10.4 mg/dL   Total Protein 6.7 6.1 - 8.1 g/dL   Albumin 3.8 3.6 - 5.1 g/dL   Globulin 2.9 1.9 - 3.7 g/dL (calc)   AG Ratio 1.3 1.0 - 2.5 (calc)   Total Bilirubin 0.5 0.2 - 1.2 mg/dL   Alkaline phosphatase (APISO) 102 37 - 153 U/L   AST 18 10 - 35 U/L   ALT 19 6 - 29 U/L  Hemoglobin A1c  Result Value Ref Range   Hgb A1c MFr Bld 6.3 (H) <5.7 % of total Hgb   Mean Plasma Glucose 134 (calc)   eAG (mmol/L) 7.4 (calc)  TSH  Result Value Ref Range   TSH 0.41 0.40 - 4.50 mIU/L  T4, free  Result Value Ref Range   Free T4 1.6 0.8 - 1.8 ng/dL  SPECIMEN COMPROMISED  Result Value Ref Range   Specimen Integrity        Assessment & Plan:   Problem List Items Addressed This Visit    None    Visit Diagnoses    Urticarial rash    -  Primary   Relevant Medications   hydrOXYzine (ATARAX/VISTARIL) 10 MG tablet   predniSONE (DELTASONE) 20 MG tablet   Other Relevant Orders   Ambulatory referral to Allergy   Allergic reaction, initial encounter       Relevant Medications   hydrOXYzine (ATARAX/VISTARIL) 10 MG tablet   predniSONE (DELTASONE) 20 MG tablet   Other Relevant Orders   Ambulatory referral to Allergy   Fluid retention       Relevant Medications   furosemide (LASIX) 20 MG tablet      Clinically with allergic reaction unspecified trigger Now recurrent allergic reaction - without acute respiratory symptoms but has complicating known COPD issue as well underlying. - General swelling and urticaria -  Well-appearing and stable resp status  Plan: 1. May use OTC anti histamine. Add Hydroxyzine 10mg  take 1-2 TID PRN itching 2. Start Prednisone taper over 7 days 3. Rx furosemide 20mg  daily PRN 3-5 days for edema - not for long term use only short term 4. Referral to Allergist Strict return criteria given for acute worsening  Additionally regarding COPD and her chronic respiratory and fatigue symptoms, asked her to return to Pulmonology to discuss further, as she seems to be treated appropriately and had prior work up as well, including ECHO CT    Meds ordered this encounter  Medications  . hydrOXYzine (ATARAX/VISTARIL) 10 MG tablet    Sig: Take 1-2 tablets (10-20 mg total) by mouth 3 (three) times daily as needed for itching.    Dispense:  60 tablet    Refill:  1  . predniSONE (DELTASONE) 20 MG tablet    Sig: Take daily with food. Start with 60mg  (3 pills) x 2 days, then reduce to 40mg  (2 pills) x 2 days, then 20mg  (1 pill) x 3 days    Dispense:  13 tablet    Refill:  0  . furosemide (LASIX) 20 MG tablet    Sig: Take 1 tablet (20 mg total) by mouth daily as needed for fluid or edema.    Dispense:  30 tablet    Refill:  0     Follow up plan: Return if symptoms worsen or fail to improve, for allergic reaction.   , DO [facility] 06/23/2019, 10:12 AM

## 2019-06-23 NOTE — Patient Instructions (Addendum)
Thank you for coming to the office today.  Ordered medicines for:  Itching - Hydroxyzine take as needed.  Take steroid prednisone taper for now for the reaction  Take fluid pill furosemide for edema swelling once daily for 3-5 days as needed  If another severe reaction or can't breath can go to hospital ED  Referral sent to allergist  [place] [facility] [place] [address] [place], [place]  [address] Phone: [phone]  Please schedule a Follow-up Appointment to: Return if symptoms worsen or fail to improve, for allergic reaction.  If you have any other questions or concerns, please feel free to call the office or send a message through [place]. You may also schedule an earlier appointment if necessary.  Additionally, you may be receiving a survey about your experience at our office within a few days to 1 week by e-mail or mail. We value your feedback.  Nobie Putnam, DO [place]

## 2019-06-25 ENCOUNTER — Other Ambulatory Visit: Payer: Self-pay

## 2019-06-25 DIAGNOSIS — R21 Rash and other nonspecific skin eruption: Secondary | ICD-10-CM | POA: Diagnosis not present

## 2019-06-25 DIAGNOSIS — I10 Essential (primary) hypertension: Secondary | ICD-10-CM | POA: Insufficient documentation

## 2019-06-25 DIAGNOSIS — Z20828 Contact with and (suspected) exposure to other viral communicable diseases: Secondary | ICD-10-CM | POA: Insufficient documentation

## 2019-06-25 DIAGNOSIS — F17210 Nicotine dependence, cigarettes, uncomplicated: Secondary | ICD-10-CM | POA: Diagnosis not present

## 2019-06-25 DIAGNOSIS — E039 Hypothyroidism, unspecified: Secondary | ICD-10-CM | POA: Diagnosis not present

## 2019-06-25 DIAGNOSIS — Z79899 Other long term (current) drug therapy: Secondary | ICD-10-CM | POA: Diagnosis not present

## 2019-06-26 ENCOUNTER — Emergency Department
Admission: EM | Admit: 2019-06-26 | Discharge: 2019-06-26 | Disposition: A | Discharge status: Home / Self Care | Payer: BLUE CROSS/BLUE SHIELD | Attending: Emergency Medicine | Admitting: Emergency Medicine

## 2019-06-26 ENCOUNTER — Other Ambulatory Visit: Payer: Self-pay

## 2019-06-26 ENCOUNTER — Encounter: Payer: Self-pay | Admitting: Emergency Medicine

## 2019-06-26 DIAGNOSIS — R21 Rash and other nonspecific skin eruption: Secondary | ICD-10-CM

## 2019-06-26 LAB — SARS CORONAVIRUS 2 (TAT 6-24 HRS): SARS Coronavirus 2: NEGATIVE

## 2019-06-26 MED ORDER — HYDROXYZINE HCL 25 MG PO TABS: 25.0000 mg | ORAL_TABLET | ORAL | 0 refills | Status: DC | PRN

## 2019-06-26 MED ORDER — DEXAMETHASONE SODIUM PHOSPHATE 10 MG/ML IJ SOLN
10.0000 mg | Freq: Once | INTRAMUSCULAR | Status: AC
  Administered 2019-06-26: 10 mg via INTRAMUSCULAR
  Filled 2019-06-26: qty 1

## 2019-06-26 NOTE — ED Triage Notes (Signed)
Patient ambulatory to triage with steady gait, without difficulty or distress noted, mask in place; pt reports generalized itchy rash x 2wks with no known cause; seen PCP on Monday and rx steroids with no relief; pt st she has not taken any antihistamines at home for relief

## 2019-06-26 NOTE — ED Provider Notes (Signed)
[facility] Emergency Department Provider Note   ____________________________________________   First MD Initiated Contact with Patient 06/26/19 0502     (approximate)  I have reviewed the triage vital signs and the nursing notes.   HISTORY  Chief Complaint Rash    HPI Gina Wells is a 58 y.o. female who presents to the ED from home with a chief complaint of rash.  Patient reports a 3-week history of generalized itchy rash which first began on her hands and has now spread to all extremities and neck.  Denies new exposures such as foods, medicines including over-the-counter's.  Saw her PCP several days ago who started her on prednisone taper and hydroxyzine 10 mg.  Referred her to an allergist.  States rash is not better.  Denies concern for STD such as syphilis.  Denies fever, cough, chest pain, shortness of breath, abdominal pain, nausea or vomiting.       Past Medical History:  Diagnosis Date  . Alcoholism (HCC)   . Anxiety   . Depression   . Fatigue   . Hx of migraine headaches   . Hypertension   . Hyperthyroidism   . Toxic multinodular goiter w/o crisis     Patient Active Problem List   Diagnosis Date Noted  . Severe hypothyroidism 05/19/2018  . Altered level of consciousness 05/19/2018  . Centrilobular emphysema (HCC) 03/08/2017  . 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 Bipolar disorder (HCC) 02/17/2016  . Essential hypertension 11/23/2015  . GERD (gastroesophageal reflux disease) 05/12/2015  . 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 Clinical depression 01/18/2015  . Dysuria-frequency syndrome 01/18/2015  . Fatigue 01/18/2015  . Encounter for general adult medical examination without abnormal findings 01/18/2015  . Cephalalgia 01/18/2015  . Mechanical and motor problems with internal organs 01/18/2015    Past Surgical History:  Procedure Laterality Date  . KIDNEY STONE SURGERY    . URETERAL STENT PLACEMENT     2002  for kidney stones  . VAGINAL HYSTERECTOMY      Prior to Admission medications   Medication Sig Start Date End Date Taking? Authorizing Provider  albuterol (VENTOLIN HFA) 108 (90 Base) MCG/ACT inhaler Inhale 1-2 puffs into the lungs every 6 (six) hours as needed for wheezing or shortness of breath. 11/22/18   Karamalegos, Netta NeatAlexander J, DO  ANORO ELLIPTA 62.5-25 MCG/INH AEPB Inhale 1 puff by mouth once daily 02/17/19   Galen ManilaKennedy, Lauren Renee, NP  CHANTIX 0.5 MG tablet Take 1 tablet (0.5 mg total) by mouth 2 (two) times daily. 04/10/19   Galen ManilaKennedy, Lauren Renee, NP  EUTHYROX 112 MCG tablet TAKE 1 TABLET BY MOUTH ONCE DAILY BEFORE  BREAKFAST 02/24/19   Galen ManilaKennedy, Lauren Renee, NP  furosemide (LASIX) 20 MG tablet Take 1 tablet (20 mg total) by mouth daily as needed for fluid or edema. 06/23/19   Karamalegos, Netta NeatAlexander J, DO  hydrochlorothiazide (HYDRODIURIL) 25 MG tablet Take 1 tablet (25 mg total) by mouth daily. 11/19/18   Karamalegos, Netta NeatAlexander J, DO  hydrOXYzine (ATARAX/VISTARIL) 25 MG tablet Take 1 tablet (25 mg total) by mouth every 4 (four) hours as needed for itching. 06/26/19   Irean HongSung,  J, MD  ipratropium-albuterol (DUONEB) 0.5-2.5 (3) MG/3ML SOLN Inhale 3 mLs into the lungs 4 (four) times daily as needed for wheezing. 12/25/18 12/20/19  [provider]  omeprazole (PRILOSEC) 20 MG capsule Take 1 capsule (20 mg total) by mouth daily before breakfast. 11/19/18   Althea CharonKaramalegos, Netta NeatAlexander J, DO  predniSONE (DELTASONE) 20 MG  tablet Take daily with food. Start with 60mg  (3 pills) x 2 days, then reduce to 40mg  (2 pills) x 2 days, then 20mg  (1 pill) x 3 days 06/23/19   , DO    Allergies Patient has no known allergies.  Family History  Problem Relation Age of Onset  . Diabetes Mother   . Alcohol abuse Father   . Diabetes Father   . Cancer Father   . Cancer Paternal Aunt   . Alcohol abuse Brother     Social History Social History   Tobacco Use  . Smoking status: Current  Every Day Smoker    Packs/day: 0.25    Years: 50.00    Pack years: 12.50    Types: Cigarettes    Start date: 07/25/1975  . Smokeless tobacco: Never Used  Substance Use Topics  . Alcohol use: No    Alcohol/week: 0.0 standard drinks  . Drug use: No    Review of Systems  Constitutional: No fever/chills Eyes: No visual changes. ENT: No sore throat. Cardiovascular: Denies chest pain. Respiratory: Denies shortness of breath. Gastrointestinal: No abdominal pain.  No nausea, no vomiting.  No diarrhea.  No constipation. Genitourinary: Negative for dysuria. Musculoskeletal: Negative for back pain. Skin: Negative for rash. Neurological: Negative for headaches, focal weakness or numbness.   ____________________________________________   PHYSICAL EXAM:  VITAL SIGNS: ED Triage Vitals  Enc Vitals Group     BP 06/26/19 0000 (!) 133/100     Pulse Rate 06/26/19 0000 83     Resp 06/26/19 0000 18     Temp 06/26/19 0000 98.7 F (37.1 C)     Temp Source 06/26/19 0000 Oral     SpO2 06/26/19 0000 97 %     Weight 06/26/19 0001 202 lb (91.6 kg)     Height 06/26/19 0001 5\' 4"  (1.626 m)     Head Circumference --      Peak Flow --      Pain Score 06/26/19 0000 0     Pain Loc --      Pain Edu? --      Excl. in GC? --     Constitutional: Alert and oriented. Well appearing and in no acute distress. Eyes: Conjunctivae are normal. PERRL. EOMI. Head: Atraumatic. Nose: No congestion/rhinnorhea. Mouth/Throat: Mucous membranes are moist.  Oropharynx non-erythematous. Neck: No stridor.   Cardiovascular: Normal rate, regular rhythm. Grossly normal heart sounds.  Good peripheral circulation. Respiratory: Normal respiratory effort.  No retractions. Lungs CTAB. Gastrointestinal: Soft and nontender. No distention. No abdominal bruits. No CVA tenderness. Musculoskeletal: Palms of hands with mild swelling.  No lower extremity tenderness nor edema.  No joint effusions. Neurologic:  Normal speech and  language. No gross focal neurologic deficits are appreciated. No gait instability. Skin:  Skin is warm, dry and intact.  Generalized maculopapular rash noted to hands, arms and legs.  None on the trunk.  No vesicles or petechiae. Psychiatric: Mood and affect are normal. Speech and behavior are normal.  ____________________________________________   LABS (all labs ordered are listed, but only abnormal results are displayed)  Labs Reviewed  SARS CORONAVIRUS 2 (TAT 6-24 HRS)   ____________________________________________  EKG  None ____________________________________________  RADIOLOGY  ED MD interpretation: None  Official radiology report(s): No results found.  ____________________________________________   PROCEDURES  Procedure(s) performed (including Critical Care):  Procedures   ____________________________________________   INITIAL IMPRESSION / ASSESSMENT AND PLAN / ED COURSE  As part of my medical decision making, I reviewed the  following data within the [place] notes reviewed and incorporated, Old chart reviewed and Notes from prior ED visits     MARJIE CHEA was evaluated in Emergency Department on 06/26/2019 for the symptoms described in the history of present illness. She was evaluated in the context of the global COVID-19 pandemic, which necessitated consideration that the patient might be at risk for infection with the SARS-CoV-2 virus that causes COVID-19. Institutional protocols and algorithms that pertain to the evaluation of patients at risk for COVID-19 are in a state of rapid change based on information released by regulatory bodies including the CDC and federal and state organizations. These policies and algorithms were followed during the patient's care in the ED.    58 year old female who presents with a 3-week history of itchy rash.  On low-dose prednisone taper and low-dose hydroxyzine.  Will administer IM  Decadron, advised her to take over-the-counter Benadryl, will increase dose of hydroxyzine to take as needed for itching.  Will obtain send out COVID-19 swab.  Refer to dermatology for evaluation and possible skin biopsy.  Return precautions given.  Patient verbalizes understanding agrees with plan of care.      ____________________________________________   FINAL CLINICAL IMPRESSION(S) / ED DIAGNOSES  Final diagnoses:  Rash     ED Discharge Orders         Ordered    hydrOXYzine (ATARAX/VISTARIL) 25 MG tablet  Every 4 hours PRN     06/26/19 0514           Note:  This document was prepared using Dragon voice recognition software and may include unintentional dictation errors.   Paulette Blanch, MD 06/26/19 [phone]

## 2019-06-26 NOTE — Discharge Instructions (Signed)
1.  You may take Atarax 25 mg every 6 hours as needed for itching.  You may alternate this with over-the-counter Benadryl. 2.  Finish your steroid taper as prescribed by your doctor. 3.  Return to the ER for worsening symptoms, persistent vomiting, lip/tongue swelling, difficulty breathing or other concerns.

## 2019-07-24 ENCOUNTER — Telehealth: Payer: Self-pay

## 2019-07-31 ENCOUNTER — Other Ambulatory Visit: Payer: Self-pay | Admitting: Family Medicine

## 2019-07-31 ENCOUNTER — Telehealth: Payer: Self-pay

## 2019-07-31 DIAGNOSIS — K219 Gastro-esophageal reflux disease without esophagitis: Secondary | ICD-10-CM

## 2019-07-31 DIAGNOSIS — R609 Edema, unspecified: Secondary | ICD-10-CM

## 2019-07-31 DIAGNOSIS — L509 Urticaria, unspecified: Secondary | ICD-10-CM

## 2019-07-31 DIAGNOSIS — T7840XA Allergy, unspecified, initial encounter: Secondary | ICD-10-CM

## 2019-08-01 ENCOUNTER — Other Ambulatory Visit: Payer: Self-pay

## 2019-08-01 ENCOUNTER — Ambulatory Visit (INDEPENDENT_AMBULATORY_CARE_PROVIDER_SITE_OTHER): Payer: Self-pay | Admitting: Family Medicine

## 2019-08-01 ENCOUNTER — Encounter: Payer: Self-pay | Admitting: Family Medicine

## 2019-08-01 DIAGNOSIS — J432 Centrilobular emphysema: Secondary | ICD-10-CM

## 2019-08-01 DIAGNOSIS — J441 Chronic obstructive pulmonary disease with (acute) exacerbation: Secondary | ICD-10-CM

## 2019-08-01 MED ORDER — LEVOFLOXACIN 500 MG PO TABS: 500.0000 mg | ORAL_TABLET | Freq: Every day | ORAL | 0 refills | Status: DC

## 2019-08-01 MED ORDER — PREDNISONE 50 MG PO TABS: 50.0000 mg | ORAL_TABLET | Freq: Every day | ORAL | 0 refills | Status: DC

## 2019-08-01 NOTE — Progress Notes (Signed)
Virtual Visit via Telephone The purpose of this virtual visit is to provide medical care while limiting exposure to the novel coronavirus (COVID19) for both patient and office staff.  Consent was obtained for phone visit:  Yes.   Answered questions that patient had about telehealth interaction:  Yes.   I discussed the limitations, risks, security and privacy concerns of performing an evaluation and management service by telephone. I also discussed with the patient that there may be a patient responsible charge related to this service. The patient expressed understanding and agreed to proceed.  Patient Location: Home Provider Location: [facility])  ---------------------------------------------------------------------- Chief Complaint  Patient presents with  . COPD    pt state she is having some breathing issues. Requesting Prednisone to help with her breathing.      S: Reviewed CMA documentation. I have called patient and gathered additional HPI as follows:  Centrilobular Emphysema / COPD Exacerbation Reports that symptoms started 1 week ago has had notable increased dyspnea with wheezing worse in evening, some productive cough with sputum. - She is using Anoro regularly, she has Nebulizer machine with DuoNeb and Albuterol - She is concerned Anoro is not working as good to prevent flares. Last flare 10/2018 and possibly mild flare in between - Previously saw [facility] Pulmonology back in 12/2018, has not been able to afford to return lately   Denies any known or suspected exposure to person with or possibly with COVID19.  Admits feeling warm at times (not measured fever), cough, productive sputum, wheezing, cough, dyspnea Denies any fevers, chills, sweats, body ache, sinus pain or pressure, headache, abdominal pain, diarrhea  Past Medical History:  Diagnosis Date  . Alcoholism (HCC)   . Anxiety   . Depression   . Fatigue   . Hx of migraine headaches   .  Hypertension   . Hyperthyroidism   . 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 Smoking status: Current Every Day Smoker    Packs/day: 0.25    Years: 50.00    Pack years: 12.50    Types: Cigarettes    Start date: 07/25/1975  . Smokeless tobacco: Never Used  Substance Use Topics  . Alcohol use: No    Alcohol/week: 0.0 standard drinks  . Drug use: No    Current Outpatient Medications:  .  albuterol (VENTOLIN HFA) 108 (90 Base) MCG/ACT inhaler, Inhale 1-2 puffs into the lungs every 6 (six) hours as needed for wheezing or shortness of breath., Disp: 1 Inhaler, Rfl: 3 .  ANORO ELLIPTA 62.5-25 MCG/INH AEPB, Inhale 1 puff by mouth once daily, Disp: 180 each, Rfl: 0 .  EUTHYROX 112 MCG tablet, TAKE 1 TABLET BY MOUTH ONCE DAILY BEFORE  BREAKFAST, Disp: 90 tablet, Rfl: 1 .  hydrochlorothiazide (HYDRODIURIL) 25 MG tablet, Take 1 tablet (25 mg total) by mouth daily., Disp: 90 tablet, Rfl: 1 .  hydrOXYzine (ATARAX/VISTARIL) 25 MG tablet, Take 1 tablet (25 mg total) by mouth every 4 (four) hours as needed for itching., Disp: 20 tablet, Rfl: 0 .  ipratropium-albuterol (DUONEB) 0.5-2.5 (3) MG/3ML SOLN, Inhale 3 mLs into the lungs 4 (four) times daily as needed for wheezing., Disp: , Rfl:  .  CHANTIX 0.5 MG tablet, Take 1 tablet (0.5 mg total) by mouth 2 (two) times daily. (Patient not taking: Reported on 08/01/2019), Disp: 60 tablet, Rfl: 0 .  furosemide (LASIX) 20 MG tablet, TAKE 1 TABLET BY MOUTH ONCE DAILY AS NEEDED FOR  FLUID  OR  EDEMA (Patient not taking: Reported on 08/01/2019), Disp: 30 tablet, Rfl: 0 .  levofloxacin (LEVAQUIN) 500 MG tablet, Take 1 tablet (500 mg total) by mouth daily. For 7 days, Disp: 7 tablet, Rfl: 0 .  omeprazole (PRILOSEC) 20 MG capsule, TAKE 1 CAPSULE BY MOUTH ONCE DAILY BEFORE BREAKFAST (Patient not taking: Reported on 08/01/2019), Disp: 90 capsule, Rfl: 0 .  predniSONE (DELTASONE) 50 MG tablet, Take 1 tablet (50 mg total) by mouth daily with  breakfast., Disp: 5 tablet, Rfl: 0  Depression screen [facility] 2/9 12/24/2018 11/19/2018 07/11/2018  Decreased Interest 0 1 0  Down, Depressed, Hopeless 0 0 0  PHQ - 2 Score 0 1 0  Altered sleeping 0 3 1  Tired, decreased energy 0 3 0  Change in appetite 0 3 0  Feeling bad or failure about yourself  0 0 0  Trouble concentrating 0 1 0  Moving slowly or fidgety/restless 0 0 0  Suicidal thoughts 0 0 0  PHQ-9 Score 0 11 1  Difficult doing work/chores Not difficult at all Not difficult at all Not difficult at all    GAD 7 : Generalized Anxiety Score 11/19/2018  Nervous, Anxious, on Edge 3  Control/stop worrying 0  Worry too much - different things 0  Trouble relaxing 3  Restless 0  Easily annoyed or irritable 3  Afraid - awful might happen 0  Total GAD 7 Score 9  Anxiety Difficulty Not difficult at all    -------------------------------------------------------------------------- O: No physical exam performed due to remote telephone encounter.  Lab results reviewed.  Recent Results (from the past 2160 hour(s))  SARS CORONAVIRUS 2 (TAT 6-24 HRS) Nasopharyngeal Nasopharyngeal Swab     Status: None   Collection Time: 06/26/19  5:20 AM   Specimen: Nasopharyngeal Swab  Result Value Ref Range   SARS Coronavirus 2 NEGATIVE NEGATIVE    Comment: (NOTE) SARS-CoV-2 target nucleic acids are NOT DETECTED. The SARS-CoV-2 RNA is generally detectable in upper and lower respiratory specimens during the acute phase of infection. Negative results do not preclude SARS-CoV-2 infection, do not rule out co-infections with other pathogens, and should not be used as the sole basis for treatment or other patient management decisions. Negative results must be combined with clinical observations, patient history, and epidemiological information. The expected result is Negative. Fact Sheet for Patients: SugarRoll.be Fact Sheet for Healthcare  Providers: https://www.woods-mathews.com/ This test is not yet approved or cleared by the [place] FDA and  has been authorized for detection and/or diagnosis of SARS-CoV-2 by FDA under an Emergency Use Authorization (EUA). This EUA will remain  in effect (meaning this test can be used) for the duration of the COVID-19 declaration under Section 56 4(b)(1) of the Act, 21 U.S.C. section 360bbb-3(b)(1), unless the authorization is terminated or revoked sooner. Performed at [facility], [place] [address] [place], [place] [address]     -------------------------------------------------------------------------- A&P:  Problem List Items Addressed This Visit    Centrilobular emphysema ([place])   Relevant Medications   predniSONE (DELTASONE) 50 MG tablet    Other Visit Diagnoses    COPD exacerbation ([place])    -  Primary   Relevant Medications   predniSONE (DELTASONE) 50 MG tablet   levofloxacin (LEVAQUIN) 500 MG tablet     Consistent with mild to moderate acute exacerbation of COPD with worsening productive cough. Similar to prior exacerbations, last 10/2018 Seems inadequately controlled COPD on current Anoro maintenance  Plan: 1. Treat AECOPD -  Start Prednisone 50mg  x 5 day steroid burst - Start Levaquin 500mg  daily x 7 days 2. Use DuoNeb vs albuterol regularly for next few days then PRN 3. Continue Anoro daily maintenance for now - I offered to rx Breztri or Trelegy, or give sample, she is hesitant on cost - despite access to coupon discount card, will defer for now, when she follows back up in office later this year in Spring perhaps can revisit maintenance inhaler - re offer triple therapy  Return criteria given if worsening  Meds ordered this encounter  Medications  . predniSONE (DELTASONE) 50 MG tablet    Sig: Take 1 tablet (50 mg total) by mouth daily with breakfast.    Dispense:  5 tablet    Refill:  0  . levofloxacin (LEVAQUIN) 500 MG tablet    Sig:  Take 1 tablet (500 mg total) by mouth daily. For 7 days    Dispense:  7 tablet    Refill:  0    Follow-up: - Return in 3 months for COPD follow-up for maintenance inhaler adjust w/ new provider, or sooner if flare not resolved  Patient verbalizes understanding with the above medical recommendations including the limitation of remote medical advice.  Specific follow-up and call-back criteria were given for patient to follow-up or seek medical care more urgently if needed.   - Time spent in direct consultation with patient on phone: 9 minutes   , DO [facility] 08/01/2019, 9:39 AM

## 2019-08-01 NOTE — Patient Instructions (Addendum)
Treat Acute COPD with Prednisone Levaquin combo, use current inhalers DuoNeb regularly during flare then PRN  Future reconsider other triple therapy inhalers Breztri or Trelegy  Please schedule a Follow-up Appointment to: Return in about 3 months (around 10/30/2019) for COPD w/ new provider.  If you have any other questions or concerns, please feel free to call the office or send a message through MyChart. You may also schedule an earlier appointment if necessary.  Additionally, you may be receiving a survey about your experience at our office within a few days to 1 week by e-mail or mail. We value your feedback.  Saralyn Pilar, DO [facility] [place]

## 2019-08-11 ENCOUNTER — Telehealth: Payer: Self-pay

## 2019-08-11 ENCOUNTER — Ambulatory Visit: Payer: Self-pay | Admitting: Pharmacist

## 2019-08-11 NOTE — Chronic Care Management (AMB) (Signed)
  Care Management   Follow Up Note   08/11/2019 Name: ROSAMAE ROCQUE MRN: [id number] DOB: 03/29/1961  Referred by: Galen Manila, NP (Inactive) Reason for referral : Chronic Care Management (Patient Phone Call)   GEANINE VANDEKAMP is a 59 y.o. year old female who is a primary care patient of Galen Manila, NP (Inactive). The care management team was consulted for assistance with care management and care coordination needs.    Receive message from Dr. Althea Charon requesting follow up to patient regarding medication management and medication assistance.  Was unable to reach patient via telephone today and have left HIPAA compliant voicemail asking patient to return my call.   Plan  The care management team will reach out to the patient again over the next 30 days.   Duanne Moron, PharmD, [facility] Clinical Pharmacist [facility] Medical [employer] [phone]

## 2019-08-26 ENCOUNTER — Telehealth: Payer: Self-pay

## 2019-08-29 ENCOUNTER — Telehealth: Payer: Self-pay

## 2019-09-05 ENCOUNTER — Telehealth: Payer: Self-pay

## 2019-09-05 ENCOUNTER — Ambulatory Visit: Payer: Self-pay | Admitting: Pharmacist

## 2019-09-05 NOTE — Chronic Care Management (AMB) (Signed)
  Care Management   Follow Up Note   09/05/2019 Name: Gina Wells MRN: [id number] DOB: 09-Aug-1960  Referred by: Galen Manila, NP (Inactive) Reason for referral : Chronic Care Management (Patient Phone Call)   Gina Wells is a 59 y.o. year old female who is a primary care patient of Galen Manila, NP (Inactive). The care management team was consulted for assistance with care management and care coordination needs.    Receive message from Dr. Althea Charon requesting follow up to patient regarding medication management and medication assistance.  Was unable to reach patient via telephone today and have left HIPAA compliant voicemail asking patient to return my call. Outreach attempt #2.   Plan  The care management team will reach out to the patient again over the next 30 days.   Duanne Moron, PharmD, [facility] Clinical Pharmacist [facility] Medical [employer] [phone]

## 2019-09-18 ENCOUNTER — Telehealth: Payer: Self-pay

## 2019-10-01 ENCOUNTER — Ambulatory Visit: Payer: Self-pay | Admitting: Pharmacist

## 2019-10-01 DIAGNOSIS — E890 Postprocedural hypothyroidism: Secondary | ICD-10-CM

## 2019-10-01 DIAGNOSIS — J432 Centrilobular emphysema: Secondary | ICD-10-CM

## 2019-10-01 NOTE — Patient Instructions (Signed)
Thank you allowing the Care Management Team to be a part of your care! It was a pleasure speaking with you today!     Care Management Team    Alto Denver RN, MSN, CCM Nurse Care Coordinator  [phone]   Duanne Moron PharmD  Clinical Pharmacist  [phone]   Dickie La LCSW Clinical Social Worker [phone]   Visit Information  Goals Addressed            This Visit's Progress   . Medication Managment       Current Barriers:  . Low Vision . Financial Barriers - Reports uninsured as of 07/2019  Pharmacist Clinical Goal(s):  Marland Kitchen Over the next 30 days, patient will work with CM Pharmacist to address needs related to opitimization of medication managment and coordination of care  Interventions:  Previously collaborated with Dr. Althea Charon. Patient spoke with provider on 1/8 for telephone visit for COPD exacerbation. Provider offered Rx for [facility] or Trelegy for triple therapy, but patient was hesitant due to concerns about cost.  Follow up with patient today regarding medication adherence and medication assistance  Ms. Furuya confirms that she has been using her maintenance inhaler (Anoro) on a scheduled basis as directed and rescue medications as needed as directed.  Reports using albuterol inhaler multiple times/day for COPD symptoms   Reports that she is now uninsured as of Jan 2021  Expresses interest in f/u with Pulmonologist, but has been unable to due to cost  Reports she is interested in trying one of the triple therapy inhalers as previously suggested by provider  Refer [facility] to [facility]  Based on reported income, patient meets eligibility requirements  Provide patient with phone number to contact clinic  Counsel on smoking cessation  Reports previously found trial of Chantix ineffective  Encourage patient to f/u with [place] [facility]uit[facility]. Provide patient with this phone number again  Counsel patient to call provider office  for medical concerns or questions  Will f/u with PCP office about possible availability of a sample of Trelegy (or Breztri) inhaler for the patient to try  Will send coordination of care message to Willeen Niece with [facility] regarding referral of patient  Patient Self Care Activities:  . To self administers medications as prescribed . Attends all scheduled provider appointments . Calls pharmacy for medication refills . Calls provider office for new concerns or questions  Please see past updates related to this goal by clicking on the "Past Updates" button in the selected goal         Patient verbalizes understanding of instructions provided today.   The care management team will reach out to the patient again over the next 14 days.   Duanne Moron, PharmD, [facility] Clinical Pharmacist [facility] Medical [employer] [phone]

## 2019-10-01 NOTE — Chronic Care Management (AMB) (Signed)
Care Management   Follow Up Note   10/01/2019 Name: Gina Wells MRN: [id number] DOB: 1961/01/06  Referred by: Galen Manila, NP (Inactive) Reason for referral : Chronic Care Management (Patient Phone Call)   Gina Wells is a 59 y.o. year old female who is a primary care patient of Galen Manila, NP (Inactive). The care management team was consulted for assistance with care management and care coordination needs.  Gina Wells has a past medical history including but not limited to COPD, hypertension, GERD and hypothyroidism.  Received message from Dr. Althea Charon requesting follow up to patient regarding medication management and medication assistance.  Outreach to Gina Wells by phone today  Review of patient status, including review of consultants reports, relevant laboratory and other test results, and collaboration with appropriate care team members and the patient's provider was performed as part of comprehensive patient evaluation and provision of chronic care management services.    SDOH (Social Determinants of Health) assessments performed: Yes See Care Plan activities for detailed interventions related to [facility])     Advanced Directives: See Care Plan and Vynca application for related entries.   Goals Addressed            This Visit's Progress   . Medication Managment       Current Barriers:  . Low Vision . Financial Barriers - Reports uninsured as of 07/2019  Pharmacist Clinical Goal(s):  Marland Kitchen Over the next 30 days, patient will work with CM Pharmacist to address needs related to opitimization of medication managment and coordination of care  Interventions:  Previously collaborated with Dr. Althea Charon. Patient spoke with provider on 1/8 for telephone visit for COPD exacerbation. Provider offered Rx for [facility] or Trelegy for triple therapy, but patient was hesitant due to concerns about cost.  Follow up with patient today  regarding medication adherence and medication assistance  Gina Wells confirms that she has been using her maintenance inhaler (Anoro) on a scheduled basis as directed and rescue medications as needed as directed.  Reports using albuterol inhaler multiple times/day for COPD symptoms   Reports that she is now uninsured as of Jan 2021  Expresses interest in f/u with Pulmonologist, but has been unable to due to cost  Reports she is interested in trying one of the triple therapy inhalers as previously suggested by provider  Refer patient to [facility]  Based on reported income, patient meets eligibility requirements  Provide patient with phone number to contact clinic  Counsel on smoking cessation  Reports previously found trial of Chantix ineffective  Encourage patient to f/u with [employer] [facility]. Provide patient with this phone number again  Counsel patient to call provider office for medical concerns or questions  Will f/u with PCP office about possible availability of a sample of Trelegy (or Breztri) inhaler for the patient to try  Will send coordination of care message to Willeen Niece with [facility] regarding referral of patient  Patient Self Care Activities:  . To self administers medications as prescribed . Attends all scheduled provider appointments . Calls pharmacy for medication refills . Calls provider office for new concerns or questions  Please see past updates related to this goal by clicking on the "Past Updates" button in the selected goal         Plan  The care management team will reach out to the patient again over the next 14 days.    Duanne Moron, PharmD, [facility] Clinical Pharmacist Lutricia Horsfall  Medical [employer] [phone]

## 2019-10-02 ENCOUNTER — Telehealth: Payer: Self-pay | Admitting: Family Medicine

## 2019-10-02 NOTE — Telephone Encounter (Signed)
Attempted to call patient to go over [place] and that we had a sample here in clinic for her.  Voicemail box is full.  Will try again.

## 2019-10-03 ENCOUNTER — Ambulatory Visit: Payer: Self-pay | Admitting: Pharmacist

## 2019-10-03 DIAGNOSIS — J432 Centrilobular emphysema: Secondary | ICD-10-CM

## 2019-10-03 NOTE — Chronic Care Management (AMB) (Addendum)
  Care Management   Follow Up Note   10/03/2019 Name: Gina DEBROUX MRN: [id number] DOB: [birth year]  Referred by: Tarri Fuller, FNP Reason for referral : Chronic Care Management (Patient Phone Call)   Gina Wells is a 59 y.o. year old female who is a primary care patient of Anitra Lauth, Jodelle Gross, FNP. The care management team was consulted for assistance with care management and care coordination needs.    Outreach to Gina Wells but unable to reach patient of leave message as voicemail box is full.  Receive a call back from [name].  Review of patient status, including review of consultants reports, relevant laboratory and other test results, and collaboration with appropriate care team members and the patient's provider was performed as part of comprehensive patient evaluation and provision of chronic care management services.    SDOH (Social Determinants of Health) assessments performed: No See Care Plan activities for detailed interventions related to [facility])     Advanced Directives: See Care Plan and Vynca application for related entries.   Goals Addressed            This Visit's Progress   . PharmD- Medication Managment       Current Barriers:  . Low Vision . Financial Barriers - Reports uninsured as of 07/2019  Pharmacist Clinical Goal(s):  Marland Kitchen Over the next 30 days, patient will work with CM Pharmacist to address needs related to opitimization of medication managment and coordination of care  Interventions:  Collaborated with PCP regarding sample of Breztri or Trelegy sample for patient to try in place of Anoro inhaler  Provider attempted to reach patient to review Breztri inhaler and advise sample available for patient at clinic, but was unable to reach her.  Unsuccessful outreach to patient by CM Pharmacist today.  UPDATE - receive call back from Gina Wells  . Counsel patient on Breztri inhaler o Advise to use 2 puffs twice  daily o Counsel on including rinsing out mouth after each use and administration, including priming instructions o Patient verbalizes understanding that she would start Breztri inhaler in place of Anoro inhaler. . Patient states that she will f/u with clinic to pick up Breztri samples  . Collaborate with Tedd Sias CMA in office to provide written patient education handout for inhaler to patient  Patient Self Care Activities:  . To self administers medications as prescribed . Attends all scheduled provider appointments . Calls pharmacy for medication refills . Calls provider office for new concerns or questions  Please see past updates related to this goal by clicking on the "Past Updates" button in the selected goal          The care management team will reach out to the patient again over the next 30 days.   Gina Wells, PharmD, [facility] Clinical Pharmacist [facility] [employer] [phone]

## 2019-10-03 NOTE — Telephone Encounter (Signed)
Patient spoke with Duanne Moron and will be coming into clinic to pick up Breztri sample Monday or Wednesday of next week.  Print out with instructions attached to sample for patient.

## 2019-10-06 ENCOUNTER — Telehealth: Payer: Self-pay | Admitting: Pharmacy Technician

## 2019-10-06 NOTE — Telephone Encounter (Signed)
Attempted to contact patient.  Left message.  Mailing patient a new patient packet to complete and return to [facility].  Patient must meet [facility]'s eligibility criteria in order to receive ongoing medication assistance.  Sherilyn Dacosta Care Manager [facility]

## 2019-10-08 ENCOUNTER — Other Ambulatory Visit: Payer: Self-pay

## 2019-10-08 ENCOUNTER — Encounter: Payer: Self-pay | Admitting: Family Medicine

## 2019-10-08 ENCOUNTER — Ambulatory Visit (INDEPENDENT_AMBULATORY_CARE_PROVIDER_SITE_OTHER): Payer: Self-pay | Admitting: Family Medicine

## 2019-10-08 VITALS — BP 156/98 | HR 74 | Temp 97.1°F | Ht 64.0 in | Wt 201.6 lb

## 2019-10-08 DIAGNOSIS — I10 Essential (primary) hypertension: Secondary | ICD-10-CM

## 2019-10-08 DIAGNOSIS — K219 Gastro-esophageal reflux disease without esophagitis: Secondary | ICD-10-CM

## 2019-10-08 DIAGNOSIS — R7303 Prediabetes: Secondary | ICD-10-CM

## 2019-10-08 DIAGNOSIS — R21 Rash and other nonspecific skin eruption: Secondary | ICD-10-CM

## 2019-10-08 DIAGNOSIS — E890 Postprocedural hypothyroidism: Secondary | ICD-10-CM

## 2019-10-08 LAB — POCT URINALYSIS DIPSTICK
Bilirubin, UA: NEGATIVE
Blood, UA: NEGATIVE
Glucose, UA: NEGATIVE
Ketones, UA: NEGATIVE
Nitrite, UA: NEGATIVE
Protein, UA: NEGATIVE
Spec Grav, UA: 1.02 (ref 1.010–1.025)
Urobilinogen, UA: 0.2 E.U./dL
pH, UA: 5 (ref 5.0–8.0)

## 2019-10-08 LAB — POCT UA - MICROALBUMIN: Microalbumin Ur, POC: NEGATIVE mg/L

## 2019-10-08 MED ORDER — OMEPRAZOLE 20 MG PO CPDR: 20.0000 mg | DELAYED_RELEASE_CAPSULE | Freq: Two times a day (BID) | ORAL | 2 refills | Status: DC

## 2019-10-08 MED ORDER — HYDROCHLOROTHIAZIDE 25 MG PO TABS: 25.0000 mg | ORAL_TABLET | Freq: Every day | ORAL | 1 refills | Status: DC

## 2019-10-08 MED ORDER — LISINOPRIL 10 MG PO TABS: 10.0000 mg | ORAL_TABLET | Freq: Every day | ORAL | 3 refills | Status: DC

## 2019-10-08 MED ORDER — LEVOTHYROXINE SODIUM 112 MCG PO TABS: ORAL_TABLET | ORAL | 1 refills | Status: DC

## 2019-10-08 NOTE — Patient Instructions (Addendum)
Have your labs completed and we will contact you with the results.  I have increased your Omeprazole from 20mg  1x a day to 2x a day to help with middle of the night acid reflux symptoms.  I have also started you on Lisinopril 10mg  daily (to take in the mornings) for help with your blood pressure control.  It is important that you stay hydrated and be sure to change positions slowly as the first week or so of the medication you may feel dizzy with positional changes.  The most common side effect of this medication is a dry, nagging cough. It can rarely cause swelling or tingling of the lips and tongue. If this occurs, stop taking the medication and call the clinic.  I have put in a referral to dermatology for the rash on both of your arms and will touch base with the referral coordinator to find out about the referral to allergy that was put in November 2020.    Begin using the Breztri inhaler as discussed and we will see you back in 2 weeks for a blood pressure recheck and to see how you are doing on this new medication.  Try to get exercise a minimum of 30 minutes per day at least 5 days per week as well as  adequate water intake all while measuring blood pressure a few times per week.  Keep a blood pressure log and bring back to clinic at your next visit.  If your readings are consistently over 140/90 to contact our office/send me a MyChart message and we will see you sooner.  Can try DASH and Mediterranean diet options, avoiding processed foods, lowering sodium intake, avoiding pork products, and eating a plant based diet for optimal health.  Education and discussion with patient regarding hypertension as well as the effects on the organs and body.  Specifically, we spoke about kidney disease, kidney failure, heart attack, stroke and up to and including death, as likely outcomes if non-compliant with blood pressure regulation.  Discussed how all of these habits are attached to each other and each  has the effect on each other.  You will receive a survey after today's visit either digitally by e-mail or paper by . Your experiences and feedback matter to December 2020.  Please respond so we know how we are doing as we provide care for you.  Call [employer] with any questions/concerns/needs.  It is my goal to be available to you for your health concerns.  Thanks for choosing me to be a partner in your healthcare needs!  [place], FNP-C Family Nurse Practitioner [facility] Phone: [phone]

## 2019-10-08 NOTE — Progress Notes (Signed)
Subjective:    Patient ID: Gina Wells, female    DOB: 1961-02-19, 59 y.o.   MRN: [id number]  Gina Wells is a 59 y.o. female presenting on 10/08/2019 for COPD (pt complains that she can hear her heartbeat in her ears. )   HPI  Hypertension - She is not checking BP at home or outside of clinic.    - Current medications: hydrochlorothiazide 25mg  daily, tolerating well without side effects - She is not currently symptomatic. - Pt denies headache, lightheadedness, dizziness, changes in vision, chest tightness/pressure, palpitations, leg swelling, sudden loss of speech or loss of consciousness. - She  reports no regular exercise routine. - Her diet is moderate in salt, moderate in fat, and moderate in carbohydrates.  GERD - She is not currently symptomatic.  - Symptoms started many years ago. This has been associated with heartburn.   - Symptoms appear to be worsened by lying down after eating.   - She denies melena, hematochezia, hematemesis, and coffee ground emesis. They have tried proton pump inhibitor: omeprazole 20mg  daily with some improvement of symptoms. Risk factors present for GERD include tobacco abuse and obesity.   Depression screen [facility] 2/9 12/24/2018 11/19/2018 07/11/2018  Decreased Interest 0 1 0  Down, Depressed, Hopeless 0 0 0  PHQ - 2 Score 0 1 0  Altered sleeping 0 3 1  Tired, decreased energy 0 3 0  Change in appetite 0 3 0  Feeling bad or failure about yourself  0 0 0  Trouble concentrating 0 1 0  Moving slowly or fidgety/restless 0 0 0  Suicidal thoughts 0 0 0  PHQ-9 Score 0 11 1  Difficult doing work/chores Not difficult at all Not difficult at all Not difficult at all    Social History   Tobacco Use  . Smoking status: Current Every Day Smoker    Packs/day: 0.25    Years: 50.00    Pack years: 12.50    Types: Cigarettes    Start date: 07/25/1975  . Smokeless tobacco: Never Used  Substance Use Topics  . Alcohol use: No   Alcohol/week: 0.0 standard drinks  . Drug use: No    Review of Systems  Constitutional: Negative.   HENT: Negative.   Eyes: Negative.   Respiratory: Negative.   Cardiovascular: Negative.   Gastrointestinal: Negative.   Endocrine: Negative.   Genitourinary: Negative.   Musculoskeletal: Negative.   Skin: Positive for color change and rash. Negative for pallor and wound.  Allergic/Immunologic: Negative.   Neurological: Negative.   Hematological: Negative.   Psychiatric/Behavioral: Negative.    Per HPI unless specifically indicated above     Objective:    BP (!) 156/98 (BP Location: Left Arm, Patient Position: Sitting, Cuff Size: Normal)   Pulse 74   Temp (!) 97.1 F (36.2 C) (Temporal)   Ht 5\' 4"  (1.626 m)   Wt 201 lb 9.6 oz (91.4 kg)   SpO2 97%   BMI 34.60 kg/m   Wt Readings from Last 3 Encounters:  10/08/19 201 lb 9.6 oz (91.4 kg)  06/26/19 202 lb (91.6 kg)  06/23/19 202 lb 6.4 oz (91.8 kg)    Physical Exam Vitals reviewed.  Constitutional:      General: She is not in acute distress.    Appearance: Normal appearance. She is well-groomed and overweight. She is not ill-appearing or toxic-appearing.  HENT:     Head: Normocephalic.     Right Ear: Tympanic membrane, ear canal and external ear normal.  There is no impacted cerumen.     Left Ear: Tympanic membrane, ear canal and external ear normal. There is no impacted cerumen.     Nose: Nose normal. No congestion or rhinorrhea.     Mouth/Throat:     Lips: Pink.     Mouth: Mucous membranes are moist.     Pharynx: Oropharynx is clear. Uvula midline. No oropharyngeal exudate or posterior oropharyngeal erythema.  Eyes:     General: Lids are normal. Vision grossly intact. No scleral icterus.       Right eye: No discharge.        Left eye: No discharge.     Extraocular Movements: Extraocular movements intact.     Conjunctiva/sclera: Conjunctivae normal.     Pupils: Pupils are equal, round, and reactive to light.  Neck:       Thyroid: No thyroid mass or thyromegaly.  Cardiovascular:     Rate and Rhythm: Normal rate and regular rhythm.     Pulses: Normal pulses.          Dorsalis pedis pulses are 2+ on the right side and 2+ on the left side.       Posterior tibial pulses are 2+ on the right side and 2+ on the left side.     Heart sounds: Normal heart sounds. No murmur. No friction rub. No gallop.   Pulmonary:     Effort: Pulmonary effort is normal. No respiratory distress.     Breath sounds: Normal breath sounds.  Abdominal:     General: Abdomen is flat. Bowel sounds are normal. There is no distension.     Palpations: Abdomen is soft. There is no hepatomegaly, splenomegaly or mass.     Tenderness: There is no abdominal tenderness. There is no guarding or rebound.     Hernia: No hernia is present.  Musculoskeletal:        General: Normal range of motion.     Cervical back: Normal range of motion and neck supple. No tenderness.     Right lower leg: No edema.     Left lower leg: No edema.  Feet:     Right foot:     Skin integrity: Skin integrity normal.     Left foot:     Skin integrity: Skin integrity normal.  Lymphadenopathy:     Cervical: No cervical adenopathy.  Skin:    General: Skin is warm and dry.     Capillary Refill: Capillary refill takes less than 2 seconds.     Coloration: Skin is not jaundiced or pale.     Findings: Erythema and rash present. No bruising or lesion. Rash is urticarial.     Comments: Bilateral forearms with uticarial, flat, rash, without itching or excoriation.  Neurological:     General: No focal deficit present.     Mental Status: She is alert and oriented to person, place, and time.     Cranial Nerves: No cranial nerve deficit.     Sensory: No sensory deficit.     Motor: No weakness.     Coordination: Coordination normal.     Gait: Gait normal.     Deep Tendon Reflexes: Reflexes normal.  Psychiatric:        Attention and Perception: Attention and perception  normal.        Mood and Affect: Mood and affect normal.        Speech: Speech normal.        Behavior: Behavior normal. Behavior is cooperative.  Thought Content: Thought content normal.        Cognition and Memory: Cognition and memory normal.        Judgment: Judgment normal.     Results for orders placed or performed in visit on 10/08/19  POCT Urinalysis Dipstick  Result Value Ref Range   Color, UA Yellow    Clarity, UA clear    Glucose, UA Negative Negative   Bilirubin, UA negative    Ketones, UA negative    Spec Grav, UA 1.020 1.010 - 1.025   Blood, UA negative    pH, UA 5.0 5.0 - 8.0   Protein, UA Negative Negative   Urobilinogen, UA 0.2 0.2 or 1.0 E.U./dL   Nitrite, UA negative    Leukocytes, UA Small (1+) (A) Negative   Appearance     Odor    POCT UA - Microalbumin  Result Value Ref Range   Microalbumin Ur, POC negative mg/L   Creatinine, POC     Albumin/Creatinine Ratio, Urine, POC        Assessment & Plan:   Problem List Items Addressed This Visit      Cardiovascular and Mediastinum   Essential hypertension - Primary    Uncontrolled hypertension.  BP goal < 130/80.  Pt is not working on lifestyle modifications.  Taking medications tolerating well without side effects.  Will add lisinopril 10mg  daily to medication regimen and follow up in 2 weeks for re-evaluation.  To take blood pressure 2x a day and record in a log, to bring back to clinic at next visit.    Plan: 1. Continue taking hydrochlorothiazide 25mg  daily, BEGIN Lisinopril 10mg  daily 2. Obtain labs ordered today  3. Encouraged heart healthy diet and increasing exercise to 30 minutes most days of the week, going no more than 2 days in a row without exercise. 4. Check BP 1-2 x per day at home, keep log, and bring to clinic at next appointment. 5. Follow up 2 weeks for recheck of blood pressure and if we need to continue to titrate medications.        Relevant Medications   lisinopril  (ZESTRIL) 10 MG tablet   hydrochlorothiazide (HYDRODIURIL) 25 MG tablet   Other Relevant Orders   CBC with Differential   COMPLETE METABOLIC PANEL WITH GFR   Lipid Profile   POCT Urinalysis Dipstick (Completed)   POCT UA - Microalbumin (Completed)     Digestive   GERD (gastroesophageal reflux disease)    Currently poorly controlled on omeprazole 20mg  once daily.  Has been taking this in the morning before breakfast daily and has noted that in the middle of the night she started to have increased GERD symptoms.  Will increase to 20mg  BID to help with break through GERD symptoms.  Plan: 1. Start Omeprazole 20mg  twice daily. Side effects discussed. Pt wants to continue med. 2. Avoid diet triggers. Reviewed need to seek care if globus sensation, difficulty swallowing, s/sx of GI bleed. 3. Follow up as needed and in 6 months.       Relevant Medications   omeprazole (PRILOSEC) 20 MG capsule     Endocrine   Postablative hypothyroidism    Labs last drawn 02/2019.  Currently taking levothyroxine daily and tolerating it well.   Will obtain updated labs for evaluation of thyroid function.  Plan: 1) Labs ordered and to be completed today 2) Continue Levothyroxine daily  3) We will see you back in 6 months  Relevant Medications   levothyroxine (EUTHYROX) 112 MCG tablet   Other Relevant Orders   Thyroid Panel With TSH     Musculoskeletal and Integument   Rash and nonspecific skin eruption    Has concerns for bilateral forearm rash with some mild swelling x a few months.  States has been to the emergency room and to our clinic for evaluation without resolution of symptoms.  Reviewed chart and patient was referred to Allergy in 05/2019, will follow up on that referral and send her to dermatology to establish for yearly skin screenings and additional evaluation of rash.        Relevant Orders   Ambulatory referral to Dermatology    Other Visit Diagnoses     Prediabetes       Relevant Orders   Lipid Profile   HgB A1c   POCT Urinalysis Dipstick (Completed)   POCT UA - Microalbumin (Completed)      Meds ordered this encounter  Medications  . lisinopril (ZESTRIL) 10 MG tablet    Sig: Take 1 tablet (10 mg total) by mouth daily.    Dispense:  90 tablet    Refill:  3  . omeprazole (PRILOSEC) 20 MG capsule    Sig: Take 1 capsule (20 mg total) by mouth 2 (two) times daily before a meal.    Dispense:  60 capsule    Refill:  2  . hydrochlorothiazide (HYDRODIURIL) 25 MG tablet    Sig: Take 1 tablet (25 mg total) by mouth daily.    Dispense:  90 tablet    Refill:  1  . levothyroxine (EUTHYROX) 112 MCG tablet    Sig: TAKE 1 TABLET BY MOUTH ONCE DAILY BEFORE  BREAKFAST    Dispense:  90 tablet    Refill:  1      Follow up plan: Return in about 2 weeks (around 10/22/2019) for BP recheck, HTN, COPD med recheck.   Charlaine Dalton, FNP Family Nurse Practitioner [facility] 10/08/2019, 9:06 AM

## 2019-10-08 NOTE — Assessment & Plan Note (Signed)
Labs last drawn 02/2019.  Currently taking levothyroxine daily and tolerating it well.   Will obtain updated labs for evaluation of thyroid function.  Plan: 1) Labs ordered and to be completed today 2) Continue Levothyroxine daily  3) We will see you back in 6 months

## 2019-10-08 NOTE — Assessment & Plan Note (Signed)
Currently poorly controlled on omeprazole 20mg  once daily.  Has been taking this in the morning before breakfast daily and has noted that in the middle of the night she started to have increased GERD symptoms.  Will increase to 20mg  BID to help with break through GERD symptoms.  Plan: 1. Start Omeprazole 20mg  twice daily. Side effects discussed. Pt wants to continue med. 2. Avoid diet triggers. Reviewed need to seek care if globus sensation, difficulty swallowing, s/sx of GI bleed. 3. Follow up as needed and in 6 months.

## 2019-10-08 NOTE — Assessment & Plan Note (Signed)
Has concerns for bilateral forearm rash with some mild swelling x a few months.  States has been to the emergency room and to our clinic for evaluation without resolution of symptoms.  Reviewed chart and patient was referred to Allergy in 05/2019, will follow up on that referral and send her to dermatology to establish for yearly skin screenings and additional evaluation of rash.

## 2019-10-08 NOTE — Assessment & Plan Note (Signed)
Uncontrolled hypertension.  BP goal < 130/80.  Pt is not working on lifestyle modifications.  Taking medications tolerating well without side effects.  Will add lisinopril 10mg  daily to medication regimen and follow up in 2 weeks for re-evaluation.  To take blood pressure 2x a day and record in a log, to bring back to clinic at next visit.    Plan: 1. Continue taking hydrochlorothiazide 25mg  daily, BEGIN Lisinopril 10mg  daily 2. Obtain labs ordered today  3. Encouraged heart healthy diet and increasing exercise to 30 minutes most days of the week, going no more than 2 days in a row without exercise. 4. Check BP 1-2 x per day at home, keep log, and bring to clinic at next appointment. 5. Follow up 2 weeks for recheck of blood pressure and if we need to continue to titrate medications.

## 2019-10-09 ENCOUNTER — Other Ambulatory Visit: Payer: Self-pay | Admitting: Family Medicine

## 2019-10-09 ENCOUNTER — Telehealth: Payer: Self-pay

## 2019-10-09 DIAGNOSIS — E039 Hypothyroidism, unspecified: Secondary | ICD-10-CM

## 2019-10-09 DIAGNOSIS — E038 Other specified hypothyroidism: Secondary | ICD-10-CM

## 2019-10-09 LAB — COMPLETE METABOLIC PANEL WITH GFR
AG Ratio: 1.7 (calc) (ref 1.0–2.5)
ALT: 9 U/L (ref 6–29)
AST: 10 U/L (ref 10–35)
Albumin: 3.9 g/dL (ref 3.6–5.1)
Alkaline phosphatase (APISO): 113 U/L (ref 37–153)
BUN: 20 mg/dL (ref 7–25)
CO2: 28 mmol/L (ref 20–32)
Calcium: 9.1 mg/dL (ref 8.6–10.4)
Chloride: 109 mmol/L (ref 98–110)
Creat: 0.92 mg/dL (ref 0.50–1.05)
GFR, Est African American: 80 mL/min/{1.73_m2} (ref >=60)
GFR, Est Non African American: 69 mL/min/{1.73_m2} (ref >=60)
Globulin: 2.3 g/dL (calc) (ref 1.9–3.7)
Glucose, Bld: 90 mg/dL (ref 65–99)
Potassium: 4.4 mmol/L (ref 3.5–5.3)
Sodium: 143 mmol/L (ref 135–146)
Total Bilirubin: 0.4 mg/dL (ref 0.2–1.2)
Total Protein: 6.2 g/dL (ref 6.1–8.1)

## 2019-10-09 LAB — CBC WITH DIFFERENTIAL/PLATELET
Absolute Monocytes: 600 cells/uL (ref 200–950)
Basophils Absolute: 61 cells/uL (ref 0–200)
Basophils Relative: 0.7 %
Eosinophils Absolute: 61 cells/uL (ref 15–500)
Eosinophils Relative: 0.7 %
HCT: 40.8 % (ref 35.0–45.0)
Hemoglobin: 13.5 g/dL (ref 11.7–15.5)
Lymphs Abs: 2897 cells/uL (ref 850–3900)
MCH: 29.2 pg (ref 27.0–33.0)
MCHC: 33.1 g/dL (ref 32.0–36.0)
MCV: 88.3 fL (ref 80.0–100.0)
MPV: 10.5 fL (ref 7.5–12.5)
Monocytes Relative: 6.9 %
Neutro Abs: 5081 cells/uL (ref 1500–7800)
Neutrophils Relative %: 58.4 %
Platelets: 246 10*3/uL (ref 140–400)
RBC: 4.62 10*6/uL (ref 3.80–5.10)
RDW: 13.2 % (ref 11.0–15.0)
Total Lymphocyte: 33.3 %
WBC: 8.7 10*3/uL (ref 3.8–10.8)

## 2019-10-09 LAB — HEMOGLOBIN A1C
Hgb A1c MFr Bld: 6.3 % of total Hgb — ABNORMAL HIGH (ref <5.7)
Mean Plasma Glucose: 134 (calc)
eAG (mmol/L): 7.4 (calc)

## 2019-10-09 LAB — LIPID PANEL
Cholesterol: 188 mg/dL (ref <200)
HDL: 59 mg/dL (ref >=50)
LDL Cholesterol (Calc): 114 mg/dL (calc) — ABNORMAL HIGH
Non-HDL Cholesterol (Calc): 129 mg/dL (calc) (ref <130)
Total CHOL/HDL Ratio: 3.2 (calc) (ref <5.0)
Triglycerides: 64 mg/dL (ref <150)

## 2019-10-09 LAB — THYROID PANEL WITH TSH
Free Thyroxine Index: 2.4 (ref 1.4–3.8)
T3 Uptake: 29 % (ref 22–35)
T4, Total: 8.3 ug/dL (ref 5.1–11.9)
TSH: 9.56 mIU/L — ABNORMAL HIGH (ref 0.40–4.50)

## 2019-10-09 MED ORDER — LEVOTHYROXINE SODIUM 125 MCG PO TABS: 125.0000 ug | ORAL_TABLET | Freq: Every day | ORAL | 0 refills | Status: DC

## 2019-10-09 MED ORDER — SYNTHROID 112 MCG PO TABS: 112.0000 ug | ORAL_TABLET | Freq: Every day | ORAL | 0 refills | Status: DC

## 2019-10-09 NOTE — Progress Notes (Signed)
Labs look great today with the exception of her TSH.  Her TSH was elevated to 9.56.  Her last levels were 0.41 and 1.02.  I am going to increase her levothyroxine and we will have her repeat labs in 6 weeks and then follow up with [place].  I have put the order in already.  Thanks

## 2019-10-09 NOTE — Progress Notes (Signed)
Patient notified staff that her last fill of medications was the generic for synthroid and requesting we send in Rx for brand name only.  States is concerned her thyroid levels may have elevated due to generic of synthroid.  Will send in Brand name Synthroid and re-eval labs in 6-8 weeks.

## 2019-10-10 ENCOUNTER — Ambulatory Visit: Payer: Self-pay | Admitting: Pharmacist

## 2019-10-10 DIAGNOSIS — E038 Other specified hypothyroidism: Secondary | ICD-10-CM

## 2019-10-10 DIAGNOSIS — E039 Hypothyroidism, unspecified: Secondary | ICD-10-CM

## 2019-10-10 DIAGNOSIS — K219 Gastro-esophageal reflux disease without esophagitis: Secondary | ICD-10-CM

## 2019-10-10 DIAGNOSIS — I10 Essential (primary) hypertension: Secondary | ICD-10-CM

## 2019-10-10 DIAGNOSIS — J432 Centrilobular emphysema: Secondary | ICD-10-CM

## 2019-10-10 NOTE — Patient Instructions (Addendum)
Thank you allowing the Chronic Care Management Team to be a part of your care! It was a pleasure speaking with you today!     CCM (Chronic Care Management) Team    Alto Denver RN, MSN, CCM Nurse Care Coordinator  [phone]   Duanne Moron PharmD  Clinical Pharmacist  [phone]   Dickie La LCSW Clinical Social Worker [phone]  Visit Information  Goals Addressed            This Visit's Progress   . PharmD- Medication Managment       Current Barriers:  . Low Vision . Financial Barriers - Reports uninsured as of 07/2019  Pharmacist Clinical Goal(s):  Marland Kitchen Over the next 30 days, patient will work with CM Pharmacist to address needs related to opitimization of medication managment and coordination of care  Interventions:  Perform chart review. Patient seen by PCP on 3/17  BP elevated at visit  Started patient on lisinopril 10 mg once daily  GERD uncontrolled  Omeprazole dose increased to 20 mg twice daily  TSH level elevated  Follow up lab work scheduled  Collaborate with PCP regarding TSH and levothyroxine dose: Confirm patient to take levothyroxine 112 mcg once daily (new manufacturer) for another 6-8 weeks then return for repeat lab work  Psychologist, sport and exercise patient on importance of BP control and monitoring  Reports taking:  HCTZ 25 mg once daily  Lisinopril 10 mg once daily  Reports has access to home BP monitor   Encourage patient to check home BP 1-2 x per day at home, keep log, and bring to next PCP appointment  Ms. Pilat reports picked up new Rx for levothyroxine 112 mcg and taking once daily as directed  Confirms taking 1st thing in AM, >30 minutes prior to food or other medications  Counsel patient on importance of medication adherence  Confirms using sample of Breztri inhaler twice daily and rinsing mouth out after use  Encourage patient to return call to [facility] for [facility]  assistance  Collaborate with W[facility] at [facility]  Patient Self Care Activities:  . To self administers medications as prescribed . Attends all scheduled provider appointments o Next PCP visit scheduled for 3/31 . Calls pharmacy for medication refills . Calls provider office for new concerns or questions  Please see past updates related to this goal by clicking on the "Past Updates" button in the selected goal         Patient verbalizes understanding of instructions provided today.   Telephone follow up appointment with care management team member scheduled for: 3/24  Duanne Moron, PharmD, [facility] Clinical Pharmacist [facility] Medical [employer] [phone]

## 2019-10-10 NOTE — Chronic Care Management (AMB) (Signed)
  Care Management   Follow Up Note   10/10/2019 Name: JEANET LUPE MRN: [id number] DOB: 28-Oct-1960  Referred by: Tarri Fuller, FNP Reason for referral : Chronic Care Management (Patient Phone Call)   ROSIBEL GIACOBBE is a 59 y.o. year old female who is a primary care patient of Anitra Lauth, Jodelle Gross, FNP. The care management team was consulted for assistance with care management and care coordination needs.  Ms. Moskowitz has a past medical history including but not limited to COPD, hypertension, GERD and hypothyroidism.  Receive voicemail from Ms. Mclinden requesting a call back.  Outreach to Ms. Khun by phone today   Review of patient status, including review of consultants reports, relevant laboratory and other test results, and collaboration with appropriate care team members and the patient's provider was performed as part of comprehensive patient evaluation and provision of chronic care management services.    SDOH (Social Determinants of Health) assessments performed: No See Care Plan activities for detailed interventions related to [facility])     Advanced Directives: See Care Plan and Vynca application for related entries.   Goals Addressed            This Visit's Progress   . PharmD- Medication Managment       Current Barriers:  . Low Vision . Financial Barriers - Reports uninsured as of 07/2019  Pharmacist Clinical Goal(s):  Marland Kitchen Over the next 30 days, patient will work with CM Pharmacist to address needs related to opitimization of medication managment and coordination of care  Interventions:  Perform chart review. Patient seen by PCP on 3/17  BP elevated at visit  Started patient on lisinopril 10 mg once daily  GERD uncontrolled  Omeprazole dose increased to 20 mg twice daily  TSH level elevated  Follow up lab work scheduled  Collaborate with PCP regarding TSH and levothyroxine dose: Confirm patient to take levothyroxine 112 mcg once daily (new  manufacturer) for another 6-8 weeks then return for repeat lab work  Psychologist, sport and exercise patient on importance of BP control and monitoring  Reports taking:  HCTZ 25 mg once daily  Lisinopril 10 mg once daily  Reports has access to home BP monitor   Encourage patient to check home BP 1-2 x per day at home, keep log, and bring to next PCP appointment  Ms. Bonnes reports picked up new Rx for levothyroxine 112 mcg and taking once daily as directed  Confirms taking 1st thing in AM, >30 minutes prior to food or other medications  Counsel patient on importance of medication adherence  Confirms using sample of Breztri inhaler twice daily and rinsing mouth out after use  Encourage patient to return call to [facility] for medication assistance  Collaborate with [facility] at [facility]  Patient Self Care Activities:  . To self administers medications as prescribed . Attends all scheduled provider appointments o Next PCP visit scheduled for 3/31 . Calls pharmacy for medication refills . Calls provider office for new concerns or questions  Please see past updates related to this goal by clicking on the "Past Updates" button in the selected goal         Plan  Telephone follow up appointment with care management team member on 3/24 as scheduled.   Duanne Moron, PharmD, [facility] Clinical Pharmacist [facility] Medical [employer] [phone]

## 2019-10-15 ENCOUNTER — Ambulatory Visit: Payer: Self-pay | Admitting: Pharmacist

## 2019-10-15 DIAGNOSIS — I10 Essential (primary) hypertension: Secondary | ICD-10-CM

## 2019-10-15 NOTE — Chronic Care Management (AMB) (Signed)
  Care Management   Follow Up Note   10/15/2019 Name: Gina Wells MRN: [id number] DOB: [birth year]  Referred by: Tarri Fuller, FNP Reason for referral : Chronic Care Management (Patient Phone Call)   Gina Wells is a 59 y.o. year old female who is a primary care patient of Anitra Lauth, Jodelle Gross, FNP. The care management team was consulted for assistance with care management and care coordination needs.  Gina Wells has a past medical history including but not limited to COPD, hypertension, GERD and hypothyroidism.  Outreach to Gina Wells by phone today   Review of patient status, including review of consultants reports, relevant laboratory and other test results, and collaboration with appropriate care team members and the patient's provider was performed as part of comprehensive patient evaluation and provision of chronic care management services.    SDOH (Social Determinants of Health) assessments performed: No See Care Plan activities for detailed interventions related to [facility])     Advanced Directives: See Care Plan and Vynca application for related entries.   Goals Addressed            This Visit's Progress   . PharmD- Medication Managment       Current Barriers:  . Low Vision . Financial Barriers - Reports uninsured as of 07/2019  Pharmacist Clinical Goal(s):  Gina Wells Over the next 30 days, patient will work with CM Pharmacist to address needs related to opitimization of medication managment and coordination of care  Interventions:  Collaborated with [facility])  Encourage patient to follow up with [facility]   Advise patient that [facility] mailed her an application  Advise patient that per [facility], she can obtain a 30 day supply of medication from program while applying  Ms. Faxon reports that she has all of her current medications at this time  Encourage patient to follow up with PCP  Reports started to have a new itchy bumpy rash on chest  and under armpit starting last week, but that rash seems to be going away, no longer itching  Denies need to f/u with PCP prior to upcoming appointment on 3/31  Reports had an episode last week while at [facility] when she became hot, dizzy and shaky. Reports [employer] [facility] checked her BP, but patient does not recall reading or if was high/low/etc  Denies need to f/u with PCP, but states that she will call office sooner if anything similar occurs  Counsel patient on importance of BP control and monitoring  Reports taking:  HCTZ 25 mg once daily  Lisinopril 10 mg once daily  Denies having started to check home BP, but confirms access to monitor   Encourage patient to check home BP 1-2 x per day at home, keep log, and bring to next PCP appointment  Will collaborate with PCP  Patient Self Care Activities:  . To self administers medications as prescribed . Attends all scheduled provider appointments o Next PCP visit scheduled for 3/31 . Calls pharmacy for medication refills . Calls provider office for new concerns or questions  Please see past updates related to this goal by clicking on the "Past Updates" button in the selected goal         Plan  The care management team will reach out to the patient again over the next 30 days.   SIGNATURE

## 2019-10-15 NOTE — Patient Instructions (Signed)
Thank you allowing the Care Management Team to be a part of your care! It was a pleasure speaking with you today!     Care Management Team    Alto Denver RN, MSN, CCM Nurse Care Coordinator  [phone]   Duanne Moron PharmD  Clinical Pharmacist  [phone]   Dickie La LCSW Clinical Social Worker [phone]   Visit Information  Goals Addressed            This Visit's Progress   . PharmD- Medication Managment       Current Barriers:  . Low Vision . Financial Barriers - Reports uninsured as of 07/2019  Pharmacist Clinical Goal(s):  Marland Kitchen Over the next 30 days, patient will work with CM Pharmacist to address needs related to opitimization of medication managment and coordination of care  Interventions:  Collaborated with [facility])  Encourage patient to follow up with [facility]   Advise patient that [facility] mailed her an application  Advise patient that per [facility], she can obtain a 30 day supply of medication from program while applying  Ms. Kawahara reports that she has all of her current medications at this time  Encourage patient to follow up with PCP  Reports started to have a new itchy bumpy rash on chest and under armpit starting last week, but that rash seems to be going away, no longer itching  Denies need to f/u with PCP prior to upcoming appointment on 3/31  Reports had an episode last week while at [facility] when she became hot, dizzy and shaky. Reports [employer] [facility] checked her BP, but patient does not recall reading or if was high/low/etc  Denies need to f/u with PCP, but states that she will call office sooner if anything similar occurs  Counsel patient on importance of BP control and monitoring  Reports taking:  HCTZ 25 mg once daily  Lisinopril 10 mg once daily  Denies having started to check home BP, but confirms access to monitor   Encourage patient to check home BP 1-2 x per day at home, keep log, and bring to next PCP  appointment  Will collaborate with PCP  Patient Self Care Activities:  . To self administers medications as prescribed . Attends all scheduled provider appointments o Next PCP visit scheduled for 3/31 . Calls pharmacy for medication refills . Calls provider office for new concerns or questions  Please see past updates related to this goal by clicking on the "Past Updates" button in the selected goal         Patient verbalizes understanding of instructions provided today.   The care management team will reach out to the patient again over the next 30 days.   Duanne Moron, PharmD, [facility] Clinical Pharmacist [facility] Medical [employer] [phone]

## 2019-10-16 ENCOUNTER — Telehealth: Payer: Self-pay | Admitting: General Practice

## 2019-10-16 NOTE — Telephone Encounter (Signed)
LVM @ 12:44pm 3/25 for patient to call back

## 2019-10-22 ENCOUNTER — Ambulatory Visit: Payer: Self-pay | Admitting: Family Medicine

## 2019-10-22 ENCOUNTER — Ambulatory Visit: Payer: Self-pay | Admitting: Licensed Clinical Social Worker

## 2019-10-22 ENCOUNTER — Telehealth: Payer: Self-pay | Admitting: General Practice

## 2019-10-22 NOTE — Telephone Encounter (Signed)
2nd attempt to contact patient we received a referral for, was able to leave VM

## 2019-10-22 NOTE — Chronic Care Management (AMB) (Signed)
  Care Management   Follow Up Note   10/22/2019 Name: JAYD FORREY MRN: [id number] DOB: 02/16/1961  Referred by: Tarri Fuller, FNP Reason for referral : Care Coordination   CLOE SOCKWELL is a 59 y.o. year old female who is a primary care patient of Anitra Lauth, Jodelle Gross, FNP. The care management team was consulted for assistance with care management and care coordination needs.    Review of patient status, including review of consultants reports, relevant laboratory and other test results, and collaboration with appropriate care team members and the patient's provider was performed as part of comprehensive patient evaluation and provision of chronic care management services.    LCSW completed CCM outreach attempt today but was unable to reach patient successfully. A HIPPA compliant voice message was left encouraging patient to return call once available. LCSW rescheduled CCM SW appointment as well.  A HIPPA compliant phone message was left for the patient providing contact information and requesting a return call.   Dickie La, BSW, MSW, LCSW [facility]  [email or web address] Phone: [phone]

## 2019-10-23 ENCOUNTER — Telehealth: Payer: Self-pay

## 2019-11-05 ENCOUNTER — Telehealth: Payer: Self-pay | Admitting: General Practice

## 2019-11-05 ENCOUNTER — Ambulatory Visit: Payer: Self-pay | Admitting: Pharmacist

## 2019-11-05 ENCOUNTER — Telehealth: Payer: Self-pay

## 2019-11-05 NOTE — Telephone Encounter (Signed)
This was the 3rd attempt to reach Gina Wells in regards to a referral we received. Call could not be completed and I was unable to leave a vm

## 2019-11-05 NOTE — Chronic Care Management (AMB) (Signed)
  Care Management   Follow Up Note   11/05/2019 Name: Gina Wells MRN: [id number] DOB: 06/17/1961  Referred by: Tarri Fuller, FNP Reason for referral : Chronic Care Management (Patient Phone Call)   Gina Wells is a 59 y.o. year old female who is a primary care patient of Anitra Lauth, Jodelle Gross, FNP. The care management team was consulted for assistance with care management and care coordination needs.    Was unable to reach patient via telephone today and have left HIPAA compliant voicemail asking patient to return my call.    Plan  The care management team will reach out to the patient again over the next 30 days.   Duanne Moron, PharmD, [facility] Clinical Pharmacist [facility] Medical [employer] [phone]

## 2019-11-07 ENCOUNTER — Ambulatory Visit: Payer: Self-pay | Admitting: Family Medicine

## 2019-12-01 ENCOUNTER — Telehealth: Payer: Self-pay

## 2019-12-01 ENCOUNTER — Ambulatory Visit: Payer: Self-pay | Admitting: Pharmacist

## 2019-12-01 NOTE — Chronic Care Management (AMB) (Signed)
  Care Management   Follow Up Note   12/01/2019 Name: Gina Wells MRN: [id number] DOB: 10-04-1960  Referred by: Tarri Fuller, FNP Reason for referral : Chronic Care Management (Patient Phone Call)   Gina Wells is a 59 y.o. year old female who is a primary care patient of Anitra Lauth, Jodelle Gross, FNP. The care management team was consulted for assistance with care management and care coordination needs.    Was unable to reach patient via telephone today and have left HIPAA compliant voicemail asking patient to return my call. Unsuccessful attempt #2.   Plan  The care management team will reach out to the patient again over the next 30 days.   Duanne Moron, PharmD, [facility] Clinical Pharmacist [facility] Medical [employer] [phone]

## 2019-12-29 ENCOUNTER — Telehealth: Payer: Self-pay

## 2019-12-29 ENCOUNTER — Ambulatory Visit: Payer: Self-pay | Admitting: Pharmacist

## 2019-12-29 NOTE — Chronic Care Management (AMB) (Signed)
  Care Management   Follow Up Note   12/29/2019 Name: CATALEYAH COLBORN MRN: [id number] DOB: 08/23/1960  Referred by: Tarri Fuller, FNP Reason for referral : Chronic Care Management (Patient Phone Call)   OLIVINE HIERS is a 59 y.o. year old female who is a primary care patient of Anitra Lauth, Jodelle Gross, FNP. The care management team was consulted for assistance with care management and care coordination needs.    Was unable to reach patient via telephone today and have left HIPAA compliant voicemail asking patient to return my call. Unsuccessful attempt #3.   Plan  The care management team is available to follow up with the patient after provider conversation with the patient regarding recommendation for care management engagement and subsequent re-referral to the care management team.   Duanne Moron, PharmD, [facility] Clinical Pharmacist [facility] [employer] [phone]

## 2020-01-13 ENCOUNTER — Ambulatory Visit: Payer: Self-pay

## 2020-01-13 NOTE — Chronic Care Management (AMB) (Signed)
  Care Management   Follow Up Note   01/13/2020 Name: Gina Wells MRN: [id number] DOB: July 02, 1961  Referred by: Tarri Fuller, FNP Reason for referral : Care Coordination   [name] is a 59 y.o. year old female who is a primary care patient of Anitra Lauth, Jodelle Gross, FNP. The care management team was consulted for assistance with care management and care coordination needs.    Review of patient status, including review of consultants reports, relevant laboratory and other test results, and collaboration with appropriate care team members and the patient's provider was performed as part of comprehensive patient evaluation and provision of chronic care management services.    LCSW completed CCM outreach attempt today but was unable to reach patient successfully. A HIPPA compliant voice message was left encouraging patient to return call once available. LCSW rescheduled CCM SW appointment as well.  A HIPPA compliant phone message was left for the patient providing contact information and requesting a return call.   The care management team is available to follow up with the patient after provider conversation with the patient regarding recommendation for care management engagement and subsequent re-referral to the care management team.   Dickie La, BSW, MSW, LCSW [employer]  [facility] [email or web address] Phone: [phone]

## 2020-01-20 ENCOUNTER — Telehealth: Payer: Self-pay

## 2020-01-20 ENCOUNTER — Telehealth: Payer: Self-pay | Admitting: Family Medicine

## 2020-01-20 NOTE — Telephone Encounter (Signed)
Pt called in complaining that she is being denied refills, called office, spoke with Morrie Sheldon, told pt must make an appt and lab work for meds to be refilled. Pt refused to make an appt and hung up.

## 2020-01-21 ENCOUNTER — Ambulatory Visit: Payer: Self-pay | Admitting: Pharmacist

## 2020-01-21 ENCOUNTER — Ambulatory Visit: Payer: Self-pay | Admitting: Family Medicine

## 2020-01-21 DIAGNOSIS — J432 Centrilobular emphysema: Secondary | ICD-10-CM

## 2020-01-21 NOTE — Patient Instructions (Addendum)
Thank you allowing the Care Management Team to be a part of your care! It was a pleasure speaking with you today!     Care Management Team    Alto Denver RN, MSN, CCM Nurse Care Coordinator  [phone]   Duanne Moron PharmD  Clinical Pharmacist  [phone]   Dickie La LCSW Clinical Social Worker [phone]   Visit Information  Goals Addressed            This Visit's Progress   . PharmD- Medication Managment       Current Barriers:  . Low Vision . Financial Barriers - Reports uninsured as of 07/2019  Pharmacist Clinical Goal(s):  Marland Kitchen Over the next 30 days, patient will work with CM Pharmacist to address needs related to opitimization of medication managment and coordination of care  Interventions:  Perform chart review.   Note patient called office yesterday requesting medication refills and was advised appointment with PCP needed prior to further refills  Note patient missed appointment with PCP scheduled on 3/31 and this morning.   Appointment now scheduled for 7/6  Counsel on the importance of attending medical appointments as scheduled  Reports resumed using Anoro inhaler with improved control.   Denies using Breztri inhaler after completion of samples (update medication list accordingly)  Again encourage patient to follow up with medication management clinic for medication assistance   Patient Self Care Activities:  . To self administers medications as prescribed . Attends all scheduled provider appointments . Calls pharmacy for medication refills . Calls provider office for new concerns or questions  Please see past updates related to this goal by clicking on the "Past Updates" button in the selected goal         Patient verbalizes understanding of instructions provided today.   Telephone follow up appointment with care management team member scheduled for: 7/28 at 9:30 am  Duanne Moron, PharmD, [facility] Clinical Pharmacist [facility] Medical [employer] [phone]

## 2020-01-21 NOTE — Chronic Care Management (AMB) (Addendum)
  Care Management   Follow Up Note   01/21/2020 Name: Gina Wells MRN: [id number] DOB: 1960-12-21  Referred by: Tarri Fuller, FNP Reason for referral : Chronic Care Management (Patient Phone Call)   Gina Wells is a 59 y.o. year old female who is a primary care patient of Anitra Lauth, Jodelle Gross, FNP. The care management team was consulted for assistance with care management and care coordination needs.    Receive voicemail from Ms. Gina Wells requesting a call back.  Outreach to Gina Wells by phone today.  Review of patient status, including review of consultants reports, relevant laboratory and other test results, and collaboration with appropriate care team members and the patient's provider was performed as part of comprehensive patient evaluation and provision of chronic care management services.    SDOH (Social Determinants of Health) assessments performed: No See Care Plan activities for detailed interventions related to [name])     Advanced Directives: See Care Plan and Vynca application for related entries.   Goals Addressed            This Visit's Progress   . PharmD- Medication Managment       Current Barriers:  . Low Vision . Financial Barriers - Reports uninsured as of 07/2019  Pharmacist Clinical Goal(s):  Marland Kitchen Over the next 30 days, patient will work with CM Pharmacist to address needs related to opitimization of medication managment and coordination of care  Interventions:  Perform chart review.   Note patient called office yesterday requesting medication refills and was advised appointment with PCP needed prior to further refills  Note patient missed appointment with PCP scheduled on 3/31 and this morning.   Appointment now scheduled for 7/6  Counsel on the importance of attending medical appointments as scheduled  Reports resumed using Anoro inhaler with improved control.   Denies using Breztri inhaler after completion of samples  (update medication list accordingly)  Again encourage patient to follow up with medication management clinic for medication assistance   Patient Self Care Activities:  . To self administers medications as prescribed . Attends all scheduled provider appointments . Calls pharmacy for medication refills . Calls provider office for new concerns or questions  Please see past updates related to this goal by clicking on the "Past Updates" button in the selected goal         Plan  Telephone follow up appointment with care management team member scheduled for: 7/28 at 9:30 am  Duanne Moron, PharmD, [facility] Clinical Pharmacist [facility] [phone]

## 2020-01-27 ENCOUNTER — Ambulatory Visit: Payer: Self-pay | Admitting: Family Medicine

## 2020-02-02 ENCOUNTER — Ambulatory Visit (INDEPENDENT_AMBULATORY_CARE_PROVIDER_SITE_OTHER): Payer: Self-pay | Admitting: Family Medicine

## 2020-02-02 ENCOUNTER — Other Ambulatory Visit: Payer: Self-pay

## 2020-02-02 ENCOUNTER — Encounter: Payer: Self-pay | Admitting: Family Medicine

## 2020-02-02 VITALS — BP 153/81 | HR 66 | Temp 97.9°F | Ht 64.0 in | Wt 192.6 lb

## 2020-02-02 DIAGNOSIS — E890 Postprocedural hypothyroidism: Secondary | ICD-10-CM

## 2020-02-02 DIAGNOSIS — K219 Gastro-esophageal reflux disease without esophagitis: Secondary | ICD-10-CM

## 2020-02-02 DIAGNOSIS — E038 Other specified hypothyroidism: Secondary | ICD-10-CM

## 2020-02-02 DIAGNOSIS — I10 Essential (primary) hypertension: Secondary | ICD-10-CM

## 2020-02-02 DIAGNOSIS — E039 Hypothyroidism, unspecified: Secondary | ICD-10-CM

## 2020-02-02 MED ORDER — OMEPRAZOLE 20 MG PO CPDR: 20.0000 mg | DELAYED_RELEASE_CAPSULE | Freq: Two times a day (BID) | ORAL | 2 refills | Status: DC

## 2020-02-02 MED ORDER — LISINOPRIL-HYDROCHLOROTHIAZIDE 20-25 MG PO TABS: 1.0000 | ORAL_TABLET | Freq: Every day | ORAL | 1 refills | Status: DC

## 2020-02-02 NOTE — Progress Notes (Signed)
Subjective:    Patient ID: Gina Wells, female    DOB: Jun 15, 1961, 59 y.o.   MRN: [id number]  Gina Wells is a 59 y.o. female presenting on 02/02/2020 for Hypertension (bloating x 1 yr. Symptoms improve some with OTC Gas relief medication. ) and Hypothyroidism   HPI  Hypertension - She is not checking BP at home or outside of clinic.    - Current medications: lisinopril 31m daily and hydrochlorothiazide 290mdaily, tolerating well without side effects - She is not currently symptomatic. - Pt denies headache, lightheadedness, dizziness, changes in vision, chest tightness/pressure, palpitations, leg swelling, sudden loss of speech or loss of consciousness. - She  reports no regular exercise routine. - Her diet is high in salt, high in fat, and high in carbohydrates.  Reports has been out of her medications for 5 days for her levothyroxine.  Has concerns for early satiety and bloating.  Has increased her omeprazole from 2056maily to 35m51mD and reports no improvement in her GERD symptoms.  Depression screen PHQ [facility] 12/24/2018 11/19/2018 07/11/2018  Decreased Interest 0 1 0  Down, Depressed, Hopeless 0 0 0  PHQ - 2 Score 0 1 0  Altered sleeping 0 3 1  Tired, decreased energy 0 3 0  Change in appetite 0 3 0  Feeling bad or failure about yourself  0 0 0  Trouble concentrating 0 1 0  Moving slowly or fidgety/restless 0 0 0  Suicidal thoughts 0 0 0  PHQ-9 Score 0 11 1  Difficult doing work/chores Not difficult at all Not difficult at all Not difficult at all    Social History   Tobacco Use  . Smoking status: Current Every Day Smoker    Packs/day: 0.25    Years: 50.00    Pack years: 12.50    Types: Cigarettes    Start date: 07/25/1975  . Smokeless tobacco: Never Used  Vaping Use  . Vaping Use: Never used  Substance Use Topics  . Alcohol use: No    Alcohol/week: 0.0 standard drinks  . Drug use: No    Review of Systems  Constitutional: Negative.   HENT:  Negative.   Eyes: Negative.   Respiratory: Negative.   Cardiovascular: Negative.   Gastrointestinal: Negative.        Reflux, early satiety  Endocrine: Negative.   Genitourinary: Negative.   Musculoskeletal: Negative.   Skin: Negative.   Allergic/Immunologic: Negative.   Neurological: Negative.   Hematological: Negative.   Psychiatric/Behavioral: Negative.    Per HPI unless specifically indicated above     Objective:    BP (!) 153/81 (BP Location: Left Arm, Patient Position: Sitting, Cuff Size: Normal)   Pulse 66   Temp 97.9 F (36.6 C) (Oral)   Ht 5' 4"  (1.626 m)   Wt 192 lb 9.6 oz (87.4 kg)   SpO2 97%   BMI 33.06 kg/m   Wt Readings from Last 3 Encounters:  02/02/20 192 lb 9.6 oz (87.4 kg)  10/08/19 201 lb 9.6 oz (91.4 kg)  06/26/19 202 lb (91.6 kg)    Physical Exam Vitals reviewed.  Constitutional:      General: She is not in acute distress.    Appearance: Normal appearance. She is well-developed and well-groomed. She is obese. She is not ill-appearing or toxic-appearing.  HENT:     Head: Normocephalic and atraumatic.     Nose:     Comments: FaceLizbeth Barkin place, covering mouth and nose. Eyes:  General: Lids are normal. Vision grossly intact.        Right eye: No discharge.        Left eye: No discharge.     Extraocular Movements: Extraocular movements intact.     Conjunctiva/sclera: Conjunctivae normal.     Pupils: Pupils are equal, round, and reactive to light.  Cardiovascular:     Rate and Rhythm: Normal rate and regular rhythm.     Pulses: Normal pulses.          Dorsalis pedis pulses are 2+ on the right side and 2+ on the left side.     Heart sounds: Normal heart sounds. No murmur heard.  No friction rub. No gallop.   Pulmonary:     Effort: Pulmonary effort is normal. No respiratory distress.     Breath sounds: Normal breath sounds.  Abdominal:     General: Bowel sounds are normal. There is no distension.     Palpations: Abdomen is soft. There is  no mass.     Tenderness: There is no abdominal tenderness. There is no guarding or rebound.  Musculoskeletal:     Right lower leg: No edema.     Left lower leg: No edema.  Lymphadenopathy:     Cervical: No cervical adenopathy.  Skin:    General: Skin is warm and dry.     Capillary Refill: Capillary refill takes less than 2 seconds.  Neurological:     General: No focal deficit present.     Mental Status: She is alert and oriented to person, place, and time.  Psychiatric:        Attention and Perception: Attention and perception normal.        Mood and Affect: Mood and affect normal.        Speech: Speech normal.        Behavior: Behavior normal. Behavior is cooperative.        Thought Content: Thought content normal.        Cognition and Memory: Cognition and memory normal.        Judgment: Judgment normal.     Results for orders placed or performed in visit on 10/08/19  CBC with Differential  Result Value Ref Range   WBC 8.7 3.8 - 10.8 Thousand/uL   RBC 4.62 3.80 - 5.10 Million/uL   Hemoglobin 13.5 11.7 - 15.5 g/dL   HCT 40.8 35 - 45 %   MCV 88.3 80.0 - 100.0 fL   MCH 29.2 27.0 - 33.0 pg   MCHC 33.1 32.0 - 36.0 g/dL   RDW 13.2 11.0 - 15.0 %   Platelets 246 140 - 400 Thousand/uL   MPV 10.5 7.5 - 12.5 fL   Neutro Abs 5,081 1,500 - 7,800 cells/uL   Lymphs Abs 2,897 850 - 3,900 cells/uL   Absolute Monocytes 600 200 - 950 cells/uL   Eosinophils Absolute 61 15 - 500 cells/uL   Basophils Absolute 61 0 - 200 cells/uL   Neutrophils Relative % 58.4 %   Total Lymphocyte 33.3 %   Monocytes Relative 6.9 %   Eosinophils Relative 0.7 %   Basophils Relative 0.7 %  COMPLETE METABOLIC PANEL WITH GFR  Result Value Ref Range   Glucose, Bld 90 65 - 99 mg/dL   BUN 20 7 - 25 mg/dL   Creat 0.92 0.50 - 1.05 mg/dL   GFR, Est Non African American 69 > OR = 60 mL/min/1.17m   GFR, Est African American 80 > OR = 60 mL/min/1.753m  BUN/Creatinine Ratio NOT APPLICABLE 6 - 22 (calc)   Sodium  143 135 - 146 mmol/L   Potassium 4.4 3.5 - 5.3 mmol/L   Chloride 109 98 - 110 mmol/L   CO2 28 20 - 32 mmol/L   Calcium 9.1 8.6 - 10.4 mg/dL   Total Protein 6.2 6.1 - 8.1 g/dL   Albumin 3.9 3.6 - 5.1 g/dL   Globulin 2.3 1.9 - 3.7 g/dL (calc)   AG Ratio 1.7 1.0 - 2.5 (calc)   Total Bilirubin 0.4 0.2 - 1.2 mg/dL   Alkaline phosphatase (APISO) 113 37 - 153 U/L   AST 10 10 - 35 U/L   ALT 9 6 - 29 U/L  Lipid Profile  Result Value Ref Range   Cholesterol 188 <200 mg/dL   HDL 59 > OR = 50 mg/dL   Triglycerides 64 <150 mg/dL   LDL Cholesterol (Calc) 114 (H) mg/dL (calc)   Total CHOL/HDL Ratio 3.2 <5.0 (calc)   Non-HDL Cholesterol (Calc) 129 <130 mg/dL (calc)  Thyroid Panel With TSH  Result Value Ref Range   T3 Uptake 29 22 - 35 %   T4, Total 8.3 5.1 - 11.9 mcg/dL   Free Thyroxine Index 2.4 1.4 - 3.8   TSH 9.56 (H) 0.40 - 4.50 mIU/L  HgB A1c  Result Value Ref Range   Hgb A1c MFr Bld 6.3 (H) <5.7 % of total Hgb   Mean Plasma Glucose 134 (calc)   eAG (mmol/L) 7.4 (calc)  POCT Urinalysis Dipstick  Result Value Ref Range   Color, UA Yellow    Clarity, UA clear    Glucose, UA Negative Negative   Bilirubin, UA negative    Ketones, UA negative    Spec Grav, UA 1.020 1.010 - 1.025   Blood, UA negative    pH, UA 5.0 5.0 - 8.0   Protein, UA Negative Negative   Urobilinogen, UA 0.2 0.2 or 1.0 E.U./dL   Nitrite, UA negative    Leukocytes, UA Small (1+) (A) Negative   Appearance     Odor    POCT UA - Microalbumin  Result Value Ref Range   Microalbumin Ur, POC negative mg/L   Creatinine, POC     Albumin/Creatinine Ratio, Urine, POC        Assessment & Plan:   Problem List Items Addressed This Visit      Cardiovascular and Mediastinum   Essential hypertension - Primary    Uncontrolled hypertension.  BP is not at goal < 130/80.  Pt is not working on lifestyle modifications.  Taking medications tolerating well without side effects. Complications: obesity, hypothyroidism,  GERD  Plan: 1. STOP taking lisinopril and HCTZ separately.  BEGIN Lisinopril-HCTZ 20-28m daily 2. Obtain labs today  3. Encouraged heart healthy diet and increasing exercise to 30 minutes most days of the week, going no more than 2 days in a row without exercise. 4. Check BP 1-2 x per day at home, keep log, and bring to clinic at next appointment. 5. Follow up 2 weeks.         Relevant Medications   lisinopril-hydrochlorothiazide (ZESTORETIC) 20-25 MG tablet   Other Relevant Orders   COMPLETE METABOLIC PANEL WITH GFR     Digestive   GERD (gastroesophageal reflux disease)    Poorly controlled GERD with Omeprazole 238mBID.  Has had an 8lb weight loss and concerns for early satiety and bloating.  Has not met with gastroenterology in the past, discussed need for referral with symptoms and patient  agreeable to GI referral.  Plan: 1. Continue omeprazole 12m BID 2. Referral sent to Gastroenterology 3. Avoid diet triggers. Reviewed need to seek care if globus sensation, difficulty swallowing, s/sx of GI bleed.      Relevant Medications   omeprazole (PRILOSEC) 20 MG capsule   Other Relevant Orders   Ambulatory referral to Gastroenterology     Endocrine   Postablative hypothyroidism    Labs drawn 10/08/2019 with TSH >9.  Patient had been on Levothyroxine 1131m, had been on Euthyroid and was requesting to return to levothyroxine and then recheck labs in 6-8 weeks.  Was unable to return to clinic until now.  Patient reports has been out of thyroid medication for 5 days.  Will have labs drawn today and discuss results with patient tomorrow as well as send in levothyroxine at that time.  Plan: 1. Have labs drawn today, based on results will send in levothyroxine 2. RTC in 6-8 weeks for repeat thyroid labs      Relevant Orders   Thyroid Panel With TSH   Severe hypothyroidism      Meds ordered this encounter  Medications  . lisinopril-hydrochlorothiazide (ZESTORETIC) 20-25 MG  tablet    Sig: Take 1 tablet by mouth daily.    Dispense:  30 tablet    Refill:  1  . omeprazole (PRILOSEC) 20 MG capsule    Sig: Take 1 capsule (20 mg total) by mouth 2 (two) times daily before a meal.    Dispense:  60 capsule    Refill:  2      Follow up plan: Return in about 2 weeks (around 02/16/2020) for HTN F/U.   NiHarlin RainFNLampasasamily Nurse Practitioner [facility] 02/02/2020, 12:10 PM

## 2020-02-02 NOTE — Assessment & Plan Note (Signed)
Labs drawn 10/08/2019 with TSH >9.  Patient had been on Levothyroxine , had been on Euthyroid and was requesting to return to levothyroxine and then recheck labs in 6-8 weeks.  Was unable to return to clinic until now.  Patient reports has been out of thyroid medication for 5 days.  Will have labs drawn today and discuss results with patient tomorrow as well as send in levothyroxine at that time.  Plan: 1. Have labs drawn today, based on results will send in levothyroxine 2. RTC in 6-8 weeks for repeat thyroid labs

## 2020-02-02 NOTE — Assessment & Plan Note (Signed)
Poorly controlled GERD with Omeprazole 4m BID.  Has had an 8lb weight loss and concerns for early satiety and bloating.  Has not met with gastroenterology in the past, discussed need for referral with symptoms and patient agreeable to GI referral.  Plan: 1. Continue omeprazole 266mBID 2. Referral sent to Gastroenterology 3. Avoid diet triggers. Reviewed need to seek care if globus sensation, difficulty swallowing, s/sx of GI bleed.

## 2020-02-02 NOTE — Assessment & Plan Note (Signed)
Uncontrolled hypertension.  BP is not at goal < 130/80.  Pt is not working on lifestyle modifications.  Taking medications tolerating well without side effects. Complications: obesity, hypothyroidism, GERD  Plan: 1. STOP taking lisinopril and HCTZ separately.  BEGIN Lisinopril-HCTZ 20-25mg  daily 2. Obtain labs today  3. Encouraged heart healthy diet and increasing exercise to 30 minutes most days of the week, going no more than 2 days in a row without exercise. 4. Check BP 1-2 x per day at home, keep log, and bring to clinic at next appointment. 5. Follow up 2 weeks.

## 2020-02-02 NOTE — Patient Instructions (Signed)
Have your labs drawn today and we will contact you with the results.  I will send in an updated synthroid prescription once we have your thyroid levels.  I have sent in a combination pill of your lisinopril-HCTZ.   STOP the lisinopril and STOP the HCTZ tablet that are separate.  A referral to Gastroenterology for poorly controlled GERD has been placed today.  If you have not heard from the specialty office or our referral coordinator within 1 week, please let us know and we will follow up with the referral coordinator for an update.  Try to get exercise a minimum of 30 minutes per day at least 5 days per week as well as  adequate water intake all while measuring blood pressure a few times per week.  Keep a blood pressure log and bring back to clinic at your next visit.  If your readings are consistently over 130/80 to contact our office/send me a MyChart message and we will see you sooner.  Can try DASH and Mediterranean diet options, avoiding processed foods, lowering sodium intake, avoiding pork products, and eating a plant based diet for optimal health.  We will plan to see you back in 2 weeks for hypertension follow up  You will receive a survey after today's visit either digitally by e-mail or paper by USPS mail. Your experiences and feedback matter to [place].  Please respond so we know how we are doing as we provide care for you.  Call us with any questions/concerns/needs.  It is my goal to be available to you for your health concerns.  Thanks for choosing me to be a partner in your healthcare needs!  Charlaine Dalton, FNP-C Family Nurse Practitioner [facility] Phone: [phone]

## 2020-02-03 ENCOUNTER — Telehealth: Payer: Self-pay | Admitting: Family Medicine

## 2020-02-03 ENCOUNTER — Other Ambulatory Visit: Payer: Self-pay | Admitting: Family Medicine

## 2020-02-03 DIAGNOSIS — E890 Postprocedural hypothyroidism: Secondary | ICD-10-CM

## 2020-02-03 LAB — THYROID PANEL WITH TSH
Free Thyroxine Index: 1.5 (ref 1.4–3.8)
T3 Uptake: 28 % (ref 22–35)
T4, Total: 5.5 ug/dL (ref 5.1–11.9)
TSH: 14.52 mIU/L — ABNORMAL HIGH (ref 0.40–4.50)

## 2020-02-03 LAB — COMPLETE METABOLIC PANEL WITH GFR
AG Ratio: 1.5 (calc) (ref 1.0–2.5)
ALT: 9 U/L (ref 6–29)
AST: 9 U/L — ABNORMAL LOW (ref 10–35)
Albumin: 3.7 g/dL (ref 3.6–5.1)
Alkaline phosphatase (APISO): 110 U/L (ref 37–153)
BUN: 18 mg/dL (ref 7–25)
CO2: 26 mmol/L (ref 20–32)
Calcium: 9.3 mg/dL (ref 8.6–10.4)
Chloride: 112 mmol/L — ABNORMAL HIGH (ref 98–110)
Creat: 1.04 mg/dL (ref 0.50–1.05)
GFR, Est African American: 69 mL/min/{1.73_m2} (ref >=60)
GFR, Est Non African American: 59 mL/min/{1.73_m2} — ABNORMAL LOW (ref >=60)
Globulin: 2.5 g/dL (calc) (ref 1.9–3.7)
Glucose, Bld: 107 mg/dL — ABNORMAL HIGH (ref 65–99)
Potassium: 4.2 mmol/L (ref 3.5–5.3)
Sodium: 144 mmol/L (ref 135–146)
Total Bilirubin: 0.3 mg/dL (ref 0.2–1.2)
Total Protein: 6.2 g/dL (ref 6.1–8.1)

## 2020-02-03 MED ORDER — LEVOTHYROXINE SODIUM 125 MCG PO TABS: 125.0000 ug | ORAL_TABLET | Freq: Every day | ORAL | 0 refills | Status: DC

## 2020-02-03 NOTE — Telephone Encounter (Signed)
The pt was notified that [facility] sent over the 125 MCG of Levothyroxine to her pharmacy. She verbalize understanding, no questions or concerns.

## 2020-02-03 NOTE — Telephone Encounter (Signed)
Copied from CRM [id number]. Topic: General - Other >> Feb 03, 2020 11:24 AM Gina Wells wrote: Reason for CRM: Patient would like the nurse to call her regarding her medication dosages.  She thinks that the pharmacy has a different dosage of what she was told by the doctor.  Please call to discuss at [phone]

## 2020-02-10 ENCOUNTER — Telehealth: Payer: Self-pay | Admitting: Family Medicine

## 2020-02-10 NOTE — Telephone Encounter (Signed)
Individual has been contacted 3+ times regarding ED referral. No further attempts to contact individual will be made. 

## 2020-02-11 ENCOUNTER — Encounter: Payer: Self-pay | Admitting: *Deleted

## 2020-02-16 ENCOUNTER — Other Ambulatory Visit: Payer: Self-pay

## 2020-02-16 ENCOUNTER — Ambulatory Visit (INDEPENDENT_AMBULATORY_CARE_PROVIDER_SITE_OTHER): Payer: Self-pay | Admitting: Family Medicine

## 2020-02-16 ENCOUNTER — Encounter: Payer: Self-pay | Admitting: Family Medicine

## 2020-02-16 VITALS — BP 138/80 | HR 68 | Temp 98.2°F | Resp 17 | Ht 64.0 in | Wt 186.8 lb

## 2020-02-16 DIAGNOSIS — I10 Essential (primary) hypertension: Secondary | ICD-10-CM

## 2020-02-16 DIAGNOSIS — K219 Gastro-esophageal reflux disease without esophagitis: Secondary | ICD-10-CM

## 2020-02-16 MED ORDER — LISINOPRIL-HYDROCHLOROTHIAZIDE 20-25 MG PO TABS: 1.0000 | ORAL_TABLET | Freq: Every day | ORAL | 1 refills | Status: DC

## 2020-02-16 MED ORDER — OMEPRAZOLE 20 MG PO CPDR: 20.0000 mg | DELAYED_RELEASE_CAPSULE | Freq: Two times a day (BID) | ORAL | 1 refills | Status: AC

## 2020-02-16 NOTE — Assessment & Plan Note (Signed)
Controlled hypertension.  Pt is working on lifestyle modifications.  Taking medications tolerating well without side effects.  Complications: obesity, hypothyroidism, GERD  Plan: 1. Continue taking lisinopril-HCTZ 20-25mg  daily 2. Obtain labs at next visit  3. Encouraged heart healthy diet and increasing exercise to 30 minutes most days of the week, going no more than 2 days in a row without exercise. 4. Check BP 1-2 x per week at home, keep log, and bring to clinic at next appointment. 5. Follow up 3 months.

## 2020-02-16 NOTE — Patient Instructions (Addendum)
Your medication refills have been sent to your pharmacy on file.  Have your labs drawn in 6 weeks for recheck on your thyroid  Contact [facility] in [place] to schedule your new patient appointment [phone]  CONTINUE lisinopril-hydrochlorothiazide 20-25mg  once daily.    Some of the possible side effects are:  - angioedema: swelling of lips, mouth, and tongue.  If this rare side effect occurs, please go to ED. - cough: you could develop a dry, hacking cough caused by this medicine.  If it occurs, it will go away after stopping this medicine.  Call the clinic before stopping the medication. - kidney damage: we will monitor your labs when we start this medicine and at least one time per year.  If you do not have an change in kidney function when starting this medicine, it will provide kidney protection over time.  Try to get exercise a minimum of 30 minutes per day at least 5 days per week as well as  adequate water intake all while measuring blood pressure a few times per week.  Keep a blood pressure log and bring back to clinic at your next visit.  If your readings are consistently over 130/80 to contact our office/send me a MyChart message and we will see you sooner.  Can try DASH and Mediterranean diet options, avoiding processed foods, lowering sodium intake, avoiding pork products, and eating a plant based diet for optimal health.  We will plan to see you back in 3 months for hypertension  You will receive a survey after today's visit either digitally by e-mail or paper by USPS mail. Your experiences and feedback matter to [place].  Please respond so we know how we are doing as we provide care for you.  Call us with any questions/concerns/needs.  It is my goal to be available to you for your health concerns.  Thanks for choosing me to be a partner in your healthcare needs!  Charlaine Dalton, FNP-C Family Nurse Practitioner [facility] Phone: [phone]

## 2020-02-16 NOTE — Assessment & Plan Note (Signed)
Has not yet scheduled visit with GI for new patient establishment and evaluation of concerns.  Reviewed referral and notes showing [facility] has attempted to contact patient 3x and has mailed her a letter to schedule new patient appointment.  Provided patient with their contact number today to call and schedule.  Reports she has not been taking her omeprazole due to cost.  Reviewed GoodRx pricing and patient reports she will speak with [facility] and review this app.  Plan: 1. Continue omeprazole 20mg  BID 2. Contact [facility] to schedule new patient appointment 3. Can continue simethicone, according to packaging directions, for symptoms of gas/bloating 4. Avoid dietary triggers.  Reviewed need to seek care if globus sensation, difficulty swallowing, s/sx of GI bleed.

## 2020-02-16 NOTE — Progress Notes (Signed)
Subjective:    Patient ID: Gina Wells, female    DOB: 06/10/1961, 59 y.o.   MRN: [id number]  Gina Wells is a 59 y.o. female presenting on 02/16/2020 for Hypertension and Bloated (pt concern about abdominal swelling. She is currently taking Simethicone and its seems to help with the bloating )   HPI   Hypertension - She is not checking BP at home or outside of clinic.    - Current medications: lisinopril-hydrochlorothiazide 20-25mg  daily, tolerating well without side effects - She is not currently symptomatic. - Pt denies headache, lightheadedness, dizziness, changes in vision, chest tightness/pressure, palpitations, leg swelling, sudden loss of speech or loss of consciousness. - She  reports no regular exercise routine. - Her diet is high in salt, high in fat, and high in carbohydrates.  Gina Wells has continued concerns for GI bloating, has been taking simethicone over the counter with some improvement in symptoms.  Reports has not picked up her prescription for omeprazole due to cost.  Reports is unsure if she received a call from GI to make a new patient appointment.  Depression screen [facility] 2/9 02/16/2020 12/24/2018 11/19/2018  Decreased Interest 0 0 1  Down, Depressed, Hopeless 0 0 0  PHQ - 2 Score 0 0 1  Altered sleeping 0 0 3  Tired, decreased energy 3 0 3  Change in appetite 0 0 3  Feeling bad or failure about yourself  0 0 0  Trouble concentrating 3 0 1  Moving slowly or fidgety/restless 0 0 0  Suicidal thoughts 0 0 0  PHQ-9 Score 6 0 11  Difficult doing work/chores Not difficult at all Not difficult at all Not difficult at all    Social History   Tobacco Use  . Smoking status: Current Every Day Smoker    Packs/day: 0.25    Years: 50.00    Pack years: 12.50    Types: Cigarettes    Start date: 07/25/1975  . Smokeless tobacco: Never Used  Vaping Use  . Vaping Use: Never used  Substance Use Topics  . Alcohol use: No    Alcohol/week: 0.0 standard  drinks  . Drug use: No    Review of Systems  Constitutional: Negative.   HENT: Negative.   Eyes: Negative.   Respiratory: Negative.   Cardiovascular: Negative.   Gastrointestinal: Negative.        Bloating/gas  Endocrine: Negative.   Genitourinary: Negative.   Musculoskeletal: Negative.   Skin: Negative.   Allergic/Immunologic: Negative.   Neurological: Negative.   Hematological: Negative.   Psychiatric/Behavioral: Negative.    Per HPI unless specifically indicated above     Objective:    BP (!) 138/80 (BP Location: Left Arm, Patient Position: Sitting, Cuff Size: Normal)   Pulse 68   Temp 98.2 F (36.8 C) (Oral)   Resp 17   Ht 5\' 4"  (1.626 m)   Wt 186 lb 12.8 oz (84.7 kg)   SpO2 100%   BMI 32.06 kg/m   Wt Readings from Last 3 Encounters:  02/16/20 186 lb 12.8 oz (84.7 kg)  02/02/20 192 lb 9.6 oz (87.4 kg)  10/08/19 201 lb 9.6 oz (91.4 kg)    Physical Exam Vitals reviewed.  Constitutional:      General: She is not in acute distress.    Appearance: Normal appearance. She is well-developed and well-groomed. She is obese. She is not ill-appearing or toxic-appearing.  HENT:     Head: Normocephalic and atraumatic.     Nose:  Comments: Lesia Sago is in place, covering mouth and nose. Eyes:     General: Lids are normal. Vision grossly intact.        Right eye: No discharge.        Left eye: No discharge.     Extraocular Movements: Extraocular movements intact.     Conjunctiva/sclera: Conjunctivae normal.     Pupils: Pupils are equal, round, and reactive to light.  Cardiovascular:     Rate and Rhythm: Normal rate and regular rhythm.     Pulses: Normal pulses.          Dorsalis pedis pulses are 2+ on the right side and 2+ on the left side.     Heart sounds: Normal heart sounds. No murmur heard.  No friction rub. No gallop.   Pulmonary:     Effort: Pulmonary effort is normal. No respiratory distress.     Breath sounds: Normal breath sounds.  Abdominal:      General: Bowel sounds are normal. There is no distension.     Palpations: Abdomen is soft. There is no mass.     Tenderness: There is no abdominal tenderness. There is no guarding or rebound.  Musculoskeletal:     Right lower leg: No edema.     Left lower leg: No edema.  Skin:    General: Skin is warm and dry.     Capillary Refill: Capillary refill takes less than 2 seconds.  Neurological:     General: No focal deficit present.     Mental Status: She is alert and oriented to person, place, and time.  Psychiatric:        Attention and Perception: Attention and perception normal.        Mood and Affect: Mood and affect normal.        Speech: Speech normal.        Behavior: Behavior normal. Behavior is cooperative.        Thought Content: Thought content normal.        Cognition and Memory: Cognition and memory normal.        Judgment: Judgment normal.    Results for orders placed or performed in visit on 02/02/20  COMPLETE METABOLIC PANEL WITH GFR  Result Value Ref Range   Glucose, Bld 107 (H) 65 - 99 mg/dL   BUN 18 7 - 25 mg/dL   Creat 1.76 1.60 - 7.37 mg/dL   GFR, Est Non African American 59 (L) > OR = 60 mL/min/1.36m2   GFR, Est African American 69 > OR = 60 mL/min/1.33m2   BUN/Creatinine Ratio NOT APPLICABLE 6 - 22 (calc)   Sodium 144 135 - 146 mmol/L   Potassium 4.2 3.5 - 5.3 mmol/L   Chloride 112 (H) 98 - 110 mmol/L   CO2 26 20 - 32 mmol/L   Calcium 9.3 8.6 - 10.4 mg/dL   Total Protein 6.2 6.1 - 8.1 g/dL   Albumin 3.7 3.6 - 5.1 g/dL   Globulin 2.5 1.9 - 3.7 g/dL (calc)   AG Ratio 1.5 1.0 - 2.5 (calc)   Total Bilirubin 0.3 0.2 - 1.2 mg/dL   Alkaline phosphatase (APISO) 110 37 - 153 U/L   AST 9 (L) 10 - 35 U/L   ALT 9 6 - 29 U/L  Thyroid Panel With TSH  Result Value Ref Range   T3 Uptake 28 22 - 35 %   T4, Total 5.5 5.1 - 11.9 mcg/dL   Free Thyroxine Index 1.5 1.4 - 3.8  TSH 14.52 (H) 0.40 - 4.50 mIU/L      Assessment & Plan:   Problem List Items Addressed  This Visit      Cardiovascular and Mediastinum   Essential hypertension - Primary    Controlled hypertension.  Pt is working on lifestyle modifications.  Taking medications tolerating well without side effects.  Complications: obesity, hypothyroidism, GERD  Plan: 1. Continue taking lisinopril-HCTZ 20-25mg  daily 2. Obtain labs at next visit  3. Encouraged heart healthy diet and increasing exercise to 30 minutes most days of the week, going no more than 2 days in a row without exercise. 4. Check BP 1-2 x per week at home, keep log, and bring to clinic at next appointment. 5. Follow up 3 months.         Relevant Medications   lisinopril-hydrochlorothiazide (ZESTORETIC) 20-25 MG tablet     Digestive   GERD (gastroesophageal reflux disease)    Has not yet scheduled visit with GI for new patient establishment and evaluation of concerns.  Reviewed referral and notes showing [facility] has attempted to contact patient 3x and has mailed her a letter to schedule new patient appointment.  Provided patient with their contact number today to call and schedule.  Reports she has not been taking her omeprazole due to cost.  Reviewed GoodRx pricing and patient reports she will speak with [facility] and review this app.  Plan: 1. Continue omeprazole 20mg  BID 2. Contact [facility] to schedule new patient appointment 3. Can continue simethicone, according to packaging directions, for symptoms of gas/bloating 4. Avoid dietary triggers.  Reviewed need to seek care if globus sensation, difficulty swallowing, s/sx of GI bleed.      Relevant Medications   simethicone (MYLICON) 125 MG chewable tablet   omeprazole (PRILOSEC) 20 MG capsule      Meds ordered this encounter  Medications  . omeprazole (PRILOSEC) 20 MG capsule    Sig: Take 1 capsule (20 mg total) by mouth 2 (two) times daily before a meal.    Dispense:  180 capsule    Refill:  1  . lisinopril-hydrochlorothiazide (ZESTORETIC) 20-25 MG tablet     Sig: Take 1 tablet by mouth daily.    Dispense:  90 tablet    Refill:  1      Follow up plan: Return in about 3 months (around 05/18/2020) for HTN F/U.   05/20/2020, FNP Family Nurse Practitioner [facility] 02/16/2020, 11:43 AM

## 2020-02-18 ENCOUNTER — Encounter: Payer: Self-pay | Admitting: Family Medicine

## 2020-02-18 ENCOUNTER — Ambulatory Visit: Payer: Self-pay | Admitting: Pharmacist

## 2020-02-18 ENCOUNTER — Telehealth: Payer: Self-pay

## 2020-02-18 DIAGNOSIS — I10 Essential (primary) hypertension: Secondary | ICD-10-CM

## 2020-02-18 DIAGNOSIS — J432 Centrilobular emphysema: Secondary | ICD-10-CM

## 2020-02-18 NOTE — Chronic Care Management (AMB) (Signed)
  Care Management   Follow Up Note   02/18/2020 Name: Gina Wells MRN: [id number] DOB: 1961-03-14  Referred by: Tarri Fuller, FNP Reason for referral : No chief complaint on file.   Gina Wells is a 59 y.o. year old female who is a primary care patient of Anitra Lauth, Jodelle Gross, FNP. The care management team was consulted for assistance with care management and care coordination needs.    Outreach to [name] by phone today.  Review of patient status, including review of consultants reports, relevant laboratory and other test results, and collaboration with appropriate care team members and the patient's provider was performed as part of comprehensive patient evaluation and provision of chronic care management services.    SDOH (Social Determinants of Health) assessments performed: No See Care Plan activities for detailed interventions related to [facility])     Advanced Directives: See Care Plan and Vynca application for related entries.   Goals Addressed            This Visit's Progress   . PharmD- Medication Managment       Current Barriers:  . Low Vision . Financial Barriers - Reports uninsured as of 07/2019  Pharmacist Clinical Goal(s):  Marland Kitchen Over the next 30 days, patient will work with CM Pharmacist to address needs related to opitimization of medication managment and coordination of care  Interventions:  Perform chart review  Counsel on importance of medication adherence  Encourage patient to restart using weekly pillbox  Reports using Anoro inhaler daily as directed and albuterol inhaler as needed  Again encourage patient to follow up with medication management clinic for medication assistance   [name] verbalized understanding and reports that she has all of her current medications at this time  Counsel patient on importance of BP control and monitoring o Reports has access to monitor at work. Encourage patient to obtain home upper arm  BP monitor in future o Counsel on BP monitoring technique o Encourage patient to check home BP 1-2 x per day at home, keep log, and bring to PCP appointment . Counsel on smoking cessation o Reports wants to quit, but not ready to set a quit date today o Reports has cut back as she has noticed the impact on her breathing and energy o Currently smoking 10-15 cigarettes/day  Desire to smoke more when she is up and active and while socializing  o Note patient previously tried:  Chantix; denies interest in trying again  Several OTC nicotine replacement therapies o Counsel on techniques to aid with future quit attempt o Encourage patient to call [place] [facility]uit[facility] and provide patient with phone number . Encourage patient to contact [facility] to schedule as referred by PCP. Provide patient with phone number  Patient Self Care Activities:  . To self administers medications as prescribed . Attends all scheduled provider appointments . Calls pharmacy for medication refills . Calls provider office for new concerns or questions  Please see past updates related to this goal by clicking on the "Past Updates" button in the selected goal         Plan  Telephone follow up appointment with care management team member scheduled for: 8/25 at 9:30 am  Duanne Moron, PharmD, [facility] Clinical Pharmacist [facility] [phone]

## 2020-02-18 NOTE — Patient Instructions (Signed)
Thank you allowing the Care Management Team to be a part of your care! It was a pleasure speaking with you today!     Care Management Team    Alto Denver RN, MSN, CCM Nurse Care Coordinator  [phone]   Duanne Moron PharmD  Clinical Pharmacist  [phone]   Dickie La LCSW Clinical Social Worker [phone]   Visit Information  Goals Addressed            This Visit's Progress   . PharmD- Medication Managment       Current Barriers:  . Low Vision . Financial Barriers - Reports uninsured as of 07/2019  Pharmacist Clinical Goal(s):  Marland Kitchen Over the next 30 days, patient will work with CM Pharmacist to address needs related to opitimization of medication managment and coordination of care  Interventions:  Perform chart review  Counsel on importance of medication adherence  Encourage patient to restart using weekly pillbox  Reports using Anoro inhaler daily as directed and albuterol inhaler as needed  Again encourage patient to follow up with medication management clinic for medication assistance   Ms. Archila verbalized understanding and reports that she has all of her current medications at this time  Counsel patient on importance of BP control and monitoring o Reports has access to monitor at work. Encourage patient to obtain home upper arm BP monitor in future o Counsel on BP monitoring technique o Encourage patient to check home BP 1-2 x per day at home, keep log, and bring to PCP appointment . Counsel on smoking cessation o Reports wants to quit, but not ready to set a quit date today o Reports has cut back as she has noticed the impact on her breathing and energy o Currently smoking 10-15 cigarettes/day  Desire to smoke more when she is up and active and while socializing  o Note patient previously tried:  Chantix; denies interest in trying again  Several OTC nicotine replacement therapies o Counsel on techniques to aid with future quit  attempt o Encourage patient to call [place] Quitline and provide patient with phone number . Encourage patient to contact [facility] to schedule as referred by PCP. Provide patient with phone number  Patient Self Care Activities:  . To self administers medications as prescribed . Attends all scheduled provider appointments . Calls pharmacy for medication refills . Calls provider office for new concerns or questions  Please see past updates related to this goal by clicking on the "Past Updates" button in the selected goal         Patient verbalizes understanding of instructions provided today.   Telephone follow up appointment with care management team member scheduled for: 8/25 at 9:30 am  Duanne Moron, PharmD, [facility] Clinical Pharmacist [facility] [employer] [phone]

## 2020-03-17 ENCOUNTER — Telehealth: Payer: Self-pay

## 2020-03-17 ENCOUNTER — Telehealth: Payer: Self-pay | Admitting: Pharmacist

## 2020-03-17 NOTE — Telephone Encounter (Signed)
°  Chronic Care Management   Outreach Note  03/17/2020 Name: Gina Wells MRN: [id number] DOB: October 08, 1960  Referred by: Tarri Fuller, FNP Reason for referral : No chief complaint on file.   Was unable to reach patient via telephone today and have left HIPAA compliant voicemail asking patient to return my call.   Follow Up Plan: Will collaborate with Care Guide to outreach to schedule follow up with me  Duanne Moron, PharmD, [facility] Clinical Pharmacist [facility] Care Management [phone]

## 2020-03-18 ENCOUNTER — Telehealth: Payer: Self-pay

## 2020-03-18 NOTE — Chronic Care Management (AMB) (Signed)
  Care Management   Note  03/18/2020 Name: Gina Wells MRN: [id number] DOB: 07/11/1961  Gina Wells is a 59 y.o. year old female who is a primary care patient of Anitra Lauth, Jodelle Gross, FNP and is actively engaged with the care management team. I reached out to [name] by phone today to assist with re-scheduling a follow up visit with the Pharmacist  Follow up plan: Unsuccessful telephone outreach attempt made. A HIPPA compliant phone message was left for the patient providing contact information and requesting a return call.  The care management team will reach out to the patient again over the next 7 days.  If patient returns call to provider office, please advise to call Embedded Care Management Care Guide Penne Lash  at [phone]  Penne Lash, RMA Care Guide, Embedded Care Coordination [facility]  [place], [place] [address] Direct Dial: [phone] [email or web address]: [email or web address]

## 2020-03-23 NOTE — Chronic Care Management (AMB) (Signed)
  Care Management   Note  03/23/2020 Name: Gina Wells MRN: [id number] DOB: [birth year]  Gina Wells is a 59 y.o. year old female who is a primary care patient of Anitra Lauth, Jodelle Gross, FNP and is actively engaged with the care management team. I reached out to [name] by phone today to assist with re-scheduling a follow up visit with the Pharmacist  Follow up plan: Telephone appointment with care management team member scheduled for:03/31/2020  Penne Lash, RMA Care Guide, Embedded Care Coordination [facility]  [place], [place] [address] Direct Dial: [phone] [email or web address]: [email or web address]

## 2020-03-23 NOTE — Telephone Encounter (Signed)
Pt has been r/s for 03/31/2020 

## 2020-03-31 ENCOUNTER — Ambulatory Visit: Payer: Self-pay | Admitting: Pharmacist

## 2020-03-31 DIAGNOSIS — J432 Centrilobular emphysema: Secondary | ICD-10-CM

## 2020-03-31 DIAGNOSIS — I10 Essential (primary) hypertension: Secondary | ICD-10-CM

## 2020-03-31 NOTE — Patient Instructions (Signed)
Thank you allowing the Care Management Team to be a part of your care! It was a pleasure speaking with you today!     Care Management Team    Alto Denver RN, MSN, CCM Nurse Care Coordinator  [phone]   Duanne Moron PharmD  Clinical Pharmacist  [phone]   Dickie La LCSW Clinical Social Worker [phone]   Visit Information  Goals Addressed            This Visit's Progress   . PharmD- Medication Managment       Current Barriers:  . Chronic Disease Management support, education, and care coordination needs related to COPD, hypertension, GERD and hypothyroidism. . Low Vision . Financial Barriers - Reports uninsured as of 07/2019  Pharmacist Clinical Goal(s):  Marland Kitchen Over the next 30 days, patient will work with CM Pharmacist to address needs related to opitimization of medication managment and coordination of care  Interventions:  Counsel on importance of medication adherence  Again encourage patient to restart using weekly pillbox  Reports using Anoro inhaler daily as directed and albuterol inhaler as needed  Counsel patient on importance of BP control and monitoring o Reports taking lisinopril-HCTZ 20-25 mg daily o Reports has access to monitor at work. Have encouraged patient to obtain home upper arm BP monitor in future  Reports SBP running in 130s; does not recall recent diastolic readings o Encourage patient to check BP 1-2 x per week at home, keep log, and bring to PCP appointment . Counsel on smoking cessation o Reports wants to quit, but not ready to set a quit date today o Currently smoking ~10-15 cigarettes/day  Desire to smoke more when she is up and active and while socializing  o Note patient previously tried:  Chantix; denies interest in trying again  Several OTC nicotine replacement therapies o Counsel on techniques to aid with future quit attempt o Encourage patient to call [place] Quitline and provide patient with phone  number . Follow up regarding referral to [facility] by PCP. Patient states that she has not scheduled an appointment as she is unable to afford the cost  Again encourage patient to follow up with medication management clinic [facility]) for medication assistance   Patient denies having followed up with [facility], but confirms having the phone number  Encourage patient to follow up with [facility] for assistance with seeing GI specialist   Patient Self Care Activities:  . To self administers medications as prescribed . Attends all scheduled provider appointments . Calls pharmacy for medication refills . Calls provider office for new concerns or questions  Please see past updates related to this goal by clicking on the "Past Updates" button in the selected goal         Patient verbalizes understanding of instructions provided today.   Telephone follow up appointment with care management team member scheduled for: 11/8 at 10 am  Duanne Moron, PharmD, [facility] Clinical Pharmacist [facility] [employer] [phone]

## 2020-03-31 NOTE — Chronic Care Management (AMB) (Signed)
Care Management   Follow Up Note   03/31/2020 Name: Gina Wells MRN: [id number] DOB: 01-Jul-1961  Referred by: Tarri Fuller, FNP Reason for referral : Chronic Care Management (Patient Phone Call)   Gina Wells is a 59 y.o. year old female who is a primary care patient of Anitra Lauth, Jodelle Gross, FNP. The care management team was consulted for assistance with care management and care coordination needs.    Outreach to [name] by phone today.  Review of patient status, including review of consultants reports, relevant laboratory and other test results, and collaboration with appropriate care team members and the patient's provider was performed as part of comprehensive patient evaluation and provision of chronic care management services.    SDOH (Social Determinants of Health) assessments performed: Yes See Care Plan activities for detailed interventions related to [facility])     Advanced Directives: See Care Plan and Vynca application for related entries.   Goals Addressed            This Visit's Progress   . PharmD- Medication Managment       Current Barriers:  . Chronic Disease Management support, education, and care coordination needs related to COPD, hypertension, GERD and hypothyroidism. . Low Vision . Financial Barriers - Reports uninsured as of 07/2019  Pharmacist Clinical Goal(s):  Marland Kitchen Over the next 30 days, patient will work with CM Pharmacist to address needs related to opitimization of medication managment and coordination of care  Interventions:  Counsel on importance of medication adherence  Again encourage patient to restart using weekly pillbox  Reports using Anoro inhaler daily as directed and albuterol inhaler as needed  Counsel patient on importance of BP control and monitoring o Reports taking lisinopril-HCTZ 20-25 mg daily o Reports has access to monitor at work. Have encouraged patient to obtain home upper arm BP monitor in  future  Reports SBP running in 130s; does not recall recent diastolic readings o Encourage patient to check BP 1-2 x per week at home, keep log, and bring to PCP appointment . Counsel on smoking cessation o Reports wants to quit, but not ready to set a quit date today o Currently smoking ~10-15 cigarettes/day  Desire to smoke more when she is up and active and while socializing  o Note patient previously tried:  Chantix; denies interest in trying again  Several OTC nicotine replacement therapies o Counsel on techniques to aid with future quit attempt o Encourage patient to call [place] Quitline and provide patient with phone number . Follow up regarding referral to [facility] by PCP. Patient states that she has not scheduled an appointment as she is unable to afford the cost  Again encourage patient to follow up with medication management clinic [facility]) for medication assistance   Patient denies having followed up with [facility], but confirms having the phone number  Encourage patient to follow up with [facility] for assistance with seeing GI specialist   Patient Self Care Activities:  . To self administers medications as prescribed . Attends all scheduled provider appointments . Calls pharmacy for medication refills . Calls provider office for new concerns or questions  Please see past updates related to this goal by clicking on the "Past Updates" button in the selected goal         Plan  Telephone follow up appointment with care management team member scheduled for: 11/8 at 10 am  Duanne Moron, PharmD, [facility] Clinical Pharmacist [facility]  [phone]  

## 2020-05-26 ENCOUNTER — Other Ambulatory Visit: Payer: Self-pay | Admitting: Family Medicine

## 2020-05-26 DIAGNOSIS — E890 Postprocedural hypothyroidism: Secondary | ICD-10-CM

## 2020-05-31 ENCOUNTER — Telehealth: Payer: Self-pay

## 2020-06-04 ENCOUNTER — Ambulatory Visit: Payer: Self-pay | Admitting: Pharmacist

## 2020-06-04 DIAGNOSIS — I10 Essential (primary) hypertension: Secondary | ICD-10-CM

## 2020-06-04 DIAGNOSIS — J432 Centrilobular emphysema: Secondary | ICD-10-CM

## 2020-06-04 NOTE — Chronic Care Management (AMB) (Signed)
Care Management   Follow Up Note   06/04/2020 Name: Gina Wells MRN: [id number] DOB: 01/21/1961  Referred by: Tarri Fuller, FNP Reason for referral : Chronic Care Management (Patient Phone Call)   Gina Wells is a 59 y.o. year old female who is a primary care patient of Anitra Lauth, Jodelle Gross, FNP. The care management team was consulted for assistance with care management and care coordination needs.    Outreach to Gina Wells by phone today.  Review of patient status, including review of consultants reports, relevant laboratory and other test results, and collaboration with appropriate care team members and the patient's provider was performed as part of comprehensive patient evaluation and provision of chronic care management services.    SDOH (Social Determinants of Health) assessments performed: Yes See Care Plan activities for detailed interventions related to [name]     Advanced Directives: See Care Plan and Vynca application for related entries.   Goals Addressed            This Visit's Progress   . PharmD- Medication Managment       Current Barriers:  . Chronic Disease Management support, education, and care coordination needs related to COPD, hypertension, GERD and hypothyroidism. . Low Vision . Financial Barriers - Reports uninsured as of 07/2019  Pharmacist Clinical Goal(s):  Marland Kitchen Over the next 30 days, patient will work with CM Pharmacist to address needs related to opitimization of medication managment and coordination of care  Interventions:  Counsel on importance of medication adherence  Reports obtained and started using weekly pillboxes  Denies missed doses, but reports sometimes taking doses later in the day  Encourage patient to add reminder alarms on phone as additional adherence aid  Reports using Anoro inhaler daily as directed. Remind patient to use albuterol as needed as directed.  Patient asks about another inhaler that  she has at home, but does not recall the name. Patient states that she is not home now, but will contact me back with the inhaler name when she returns home.  Counsel patient on importance of BP control and monitoring o Reports taking lisinopril-HCTZ 20-25 mg daily o Reports obtained home upper arm BP monitor  Unable to provide specific readings (denies having recorded these), but recalls recent readings running 130s-150s/80s o Counsel on BP monitoring technique. Will mail patient handout on BP monitoring and BP log as requested o Discuss role of exercise in BP control  Reports currently walking with granddaughter for ~1 hour x once/week  Encourage patient to increase walking to 30 minutes most days of the week o Counsel on importance of limiting salt/sodium, including reviewing nutrition labels for sodium content o Counsel patient to check BP 1-2 x per week at home, keep log, and bring to PCP appointment . Counsel on smoking cessation o Reports wants to quit and has set quit date for 12/9 (granddaughter's birthday) o Reports has reduced smoking to ~5 cigarettes/day  Has reported desire to smoke more when she is up and active and while socializing  o Denies interest in drug therapy for quit attempt; states planning to quit using will power  Note patient reports previously tried: Chantix, several OTC nicotine replacement therapies o Reports motivated by granddaughter, smell of smoke and headaches o Encourage patient to call [place] Quitline and provide patient with phone number . Again encourage patient to follow up with medication management clinic [facility]) for medication assistance  o Patient denies having followed up with [facility], but confirms having  the phone number . Again encourage patient to follow up with [facility] for assistance with seeing GI specialist  o Note patient previously referred to [facility] by PCP, but stated that she did not scheduled an appointment as she is unable to  afford the cost . Will encourage patient to schedule follow up appointment with PCP  Patient Self Care Activities:  . To self administers medications as prescribed . Attends all scheduled provider appointments . Calls pharmacy for medication refills . Calls provider office for new concerns or questions  Please see past updates related to this goal by clicking on the "Past Updates" button in the selected goal         Plan  Telephone follow up appointment with care management team member scheduled for: 12/3 at 10:30 am  Duanne Moron, PharmD, [facility] Clinical Pharmacist [facility] [phone]

## 2020-06-04 NOTE — Patient Instructions (Signed)
Thank you allowing the Care Management Team to be a part of your care! It was a pleasure speaking with you today!     Care Management Team    Alto Denver RN, MSN, CCM Nurse Care Coordinator  [phone]   Duanne Moron PharmD  Clinical Pharmacist  [phone]   Dickie La LCSW Clinical Social Worker [phone]   Visit Information  Goals Addressed            This Visit's Progress   . PharmD- Medication Managment       Current Barriers:  . Chronic Disease Management support, education, and care coordination needs related to COPD, hypertension, GERD and hypothyroidism. . Low Vision . Financial Barriers - Reports uninsured as of 07/2019  Pharmacist Clinical Goal(s):  Marland Kitchen Over the next 30 days, patient will work with CM Pharmacist to address needs related to opitimization of medication managment and coordination of care  Interventions:  Counsel on importance of medication adherence  Reports obtained and started using weekly pillboxes  Denies missed doses, but reports sometimes taking doses later in the day  Encourage patient to add reminder alarms on phone as additional adherence aid  Reports using Anoro inhaler daily as directed. Remind patient to use albuterol as needed as directed.  Patient asks about another inhaler that she has at home, but does not recall the name. Patient states that she is not home now, but will contact me back with the inhaler name when she returns home.  Counsel patient on importance of BP control and monitoring o Reports taking lisinopril-HCTZ 20-25 mg daily o Reports obtained home upper arm BP monitor  Unable to provide specific readings (denies having recorded these), but recalls recent readings running 130s-150s/80s o Counsel on BP monitoring technique. Will mail patient handout on BP monitoring and BP log as requested o Discuss role of exercise in BP control  Reports currently walking with granddaughter for ~1 hour x  once/week  Encourage patient to increase walking to 30 minutes most days of the week o Counsel on importance of limiting salt/sodium, including reviewing nutrition labels for sodium content o Counsel patient to check BP 1-2 x per week at home, keep log, and bring to PCP appointment . Counsel on smoking cessation o Reports wants to quit and has set quit date for 12/9 (granddaughter's birthday) o Reports has reduced smoking to ~5 cigarettes/day  Has reported desire to smoke more when she is up and active and while socializing  o Denies interest in drug therapy for quit attempt; states planning to quit using will power  Note patient reports previously tried: Chantix, several OTC nicotine replacement therapies o Reports motivated by granddaughter, smell of smoke and headaches o Encourage patient to call [place] Quitline and provide patient with phone number . Again encourage patient to follow up with medication management clinic [facility]) for medication assistance  o Patient denies having followed up with [facility], but confirms having the phone number . Again encourage patient to follow up with [facility] for assistance with seeing GI specialist  o Note patient previously referred to [facility] by PCP, but stated that she did not scheduled an appointment as she is unable to afford the cost . Will encourage patient to schedule follow up appointment with PCP  Patient Self Care Activities:  . To self administers medications as prescribed . Attends all scheduled provider appointments . Calls pharmacy for medication refills . Calls provider office for new concerns or questions  Please see  past updates related to this goal by clicking on the "Past Updates" button in the selected goal         The patient verbalized understanding of instructions, educational materials, and care plan provided today and declined offer to receive copy of patient instructions, educational materials, and care plan.    Telephone follow up appointment with care management team member scheduled for: 12/3 at 10:30 am  Duanne Moron, PharmD, [facility] Clinical Pharmacist [facility] [phone]

## 2020-06-25 ENCOUNTER — Telehealth: Payer: Self-pay

## 2020-06-25 ENCOUNTER — Telehealth: Payer: Self-pay | Admitting: Pharmacist

## 2020-06-25 NOTE — Telephone Encounter (Signed)
°  Chronic Care Management   Outreach Note  06/25/2020 Name: Gina Wells MRN: [id number] DOB: 09-13-1960  Referred by: Tarri Fuller, FNP Reason for referral : No chief complaint on file.   An unsuccessful telephone outreach was attempted today. The patient was referred to the case management team for assistance with care management and care coordination.   Follow Up Plan: Will collaborate with Care Guide to outreach to schedule follow up with me  Duanne Moron, PharmD, [facility] Clinical Pharmacist [facility] Care Management [phone]

## 2020-07-01 NOTE — Telephone Encounter (Signed)
Pt has been r/s  

## 2020-08-05 ENCOUNTER — Other Ambulatory Visit: Payer: Self-pay

## 2020-08-05 ENCOUNTER — Telehealth (INDEPENDENT_AMBULATORY_CARE_PROVIDER_SITE_OTHER): Payer: Self-pay | Admitting: Family Medicine

## 2020-08-05 ENCOUNTER — Encounter: Payer: Self-pay | Admitting: Family Medicine

## 2020-08-05 DIAGNOSIS — R0602 Shortness of breath: Secondary | ICD-10-CM

## 2020-08-05 DIAGNOSIS — R6883 Chills (without fever): Secondary | ICD-10-CM | POA: Insufficient documentation

## 2020-08-05 MED ORDER — ALBUTEROL SULFATE HFA 108 (90 BASE) MCG/ACT IN AERS: 2.0000 | INHALATION_SPRAY | Freq: Four times a day (QID) | RESPIRATORY_TRACT | 0 refills | Status: DC | PRN

## 2020-08-05 MED ORDER — PREDNISONE 50 MG PO TABS: ORAL_TABLET | ORAL | 0 refills | Status: DC

## 2020-08-05 NOTE — Progress Notes (Signed)
Virtual Visit via Telephone  The purpose of this virtual visit is to provide medical care while limiting exposure to the novel coronavirus (COVID19) for both patient and office staff.  Consent was obtained for phone visit:  Yes.   Answered questions that patient had about telehealth interaction:  Yes.   I discussed the limitations, risks, security and privacy concerns of performing an evaluation and management service by telephone. I also discussed with the patient that there may be a patient responsible charge related to this service. The patient expressed understanding and agreed to proceed.  Patient is at home and is accessed via telephone Services are provided by Charlaine Dalton, FNP-C from [facility])  ---------------------------------------------------------------------- Chief Complaint  Patient presents with  . Chills    Sweats, cold chills, headache, fatigue and SOB x 4 days. The symptoms seem like it started on Saturday after getting her booster shot. The pt just concern because the symptoms been persistent.    S: Reviewed CMA documentation. I have called patient and gathered additional HPI as follows:  Ms. Mehra presents for virtual visit via telephone for concerns of chills, sweats, headache, fatigue and shortness of breath since Saturday.  Has shortness of breath at times with rest and exertion, as well as shortness of breath when laying on her back.  Reports she had her COVID booster on Saturday.  Is unsure if she had been exposed to any sick contacts prior to her immunization.  Denies fevers, sore throat, change in taste/smell, cough, CP, abdominal pain, n/v/d.  Patient is currently home Denies any high risk travel to areas of current concern for COVID19. Denies any known or suspected exposure to person with or possibly with COVID19.  Past Medical History:  Diagnosis Date  . Alcoholism (HCC)   . Anxiety   . Depression   . Fatigue   . Hx of  migraine headaches   . Hypertension   . Hyperthyroidism   . 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 Smoking status: Current Every Day Smoker    Packs/day: 0.25    Years: 50.00    Pack years: 12.50    Types: Cigarettes    Start date: 07/25/1975  . Smokeless tobacco: Never Used  Vaping Use  . Vaping Use: Never used  Substance Use Topics  . Alcohol use: No    Alcohol/week: 0.0 standard drinks  . Drug use: No    Current Outpatient Medications:  .  albuterol (VENTOLIN HFA) 108 (90 Base) MCG/ACT inhaler, Inhale 2 puffs into the lungs every 6 (six) hours as needed for wheezing or shortness of breath., Disp: 8 g, Rfl: 0 .  ANORO ELLIPTA 62.5-25 MCG/INH AEPB, Inhale 1 puff by mouth once daily, Disp: 180 each, Rfl: 0 .  levothyroxine (SYNTHROID) 125 MCG tablet, Take 1 tablet by mouth once daily, Disp: 90 tablet, Rfl: 0 .  lisinopril-hydrochlorothiazide (ZESTORETIC) 20-25 MG tablet, Take 1 tablet by mouth daily., Disp: 90 tablet, Rfl: 1 .  omeprazole (PRILOSEC) 20 MG capsule, Take 1 capsule (20 mg total) by mouth 2 (two) times daily before a meal., Disp: 180 capsule, Rfl: 1 .  predniSONE (DELTASONE) 50 MG tablet, Take 1 tablet daily x 5 days, Disp: 5 tablet, Rfl: 0 .  simethicone (MYLICON) 125 MG chewable tablet, Chew 125 mg by mouth every 6 (six) hours as needed for flatulence., Disp: , Rfl:   Depression screen [facility] 2/9 02/16/2020 12/24/2018 11/19/2018  Decreased  Interest 0 0 1  Down, Depressed, Hopeless 0 0 0  PHQ - 2 Score 0 0 1  Altered sleeping 0 0 3  Tired, decreased energy 3 0 3  Change in appetite 0 0 3  Feeling bad or failure about yourself  0 0 0  Trouble concentrating 3 0 1  Moving slowly or fidgety/restless 0 0 0  Suicidal thoughts 0 0 0  PHQ-9 Score 6 0 11  Difficult doing work/chores Not difficult at all Not difficult at all Not difficult at all    GAD 7 : Generalized Anxiety Score 02/16/2020 11/19/2018  Nervous, Anxious, on Edge 0 3   Control/stop worrying 0 0  Worry too much - different things 0 0  Trouble relaxing 0 3  Restless 0 0  Easily annoyed or irritable 0 3  Afraid - awful might happen 0 0  Total GAD 7 Score 0 9  Anxiety Difficulty Not difficult at all Not difficult at all    -------------------------------------------------------------------------- O: No physical exam performed due to remote telephone encounter.  Physical Exam: Patient remotely monitored without video.  Verbal communication appropriate.  Cognition normal.  No results found for this or any previous visit (from the past 2160 hour(s)).  -------------------------------------------------------------------------- A&P:  Problem List Items Addressed This Visit      Other   Chills    See SOB AP      Shortness of breath - Primary    Likely acute viral infection vs booster response after third injection.  Will treat with prednisone 50mg  daily x 5 days and albuterol inhaler 1-2 puffs every 4-6 hours for shortness of breath.  Encouraged to avoid NSAIDs while taking prednisone but can take acetaminophen, according to packaging directions.  Scheduled for COVID swab on Saturday 08/07/20.  Strict ER precautions.  RTC PRN.      Relevant Medications   predniSONE (DELTASONE) 50 MG tablet   albuterol (VENTOLIN HFA) 108 (90 Base) MCG/ACT inhaler      Meds ordered this encounter  Medications  . predniSONE (DELTASONE) 50 MG tablet    Sig: Take 1 tablet daily x 5 days    Dispense:  5 tablet    Refill:  0  . albuterol (VENTOLIN HFA) 108 (90 Base) MCG/ACT inhaler    Sig: Inhale 2 puffs into the lungs every 6 (six) hours as needed for wheezing or shortness of breath.    Dispense:  8 g    Refill:  0    Follow-up: - Return if symptoms worsen or fail to improve  Patient verbalizes understanding with the above medical recommendations including the limitation of remote medical advice.  Specific follow-up and call-back criteria were given for  patient to follow-up or seek medical care more urgently if needed.  - Time spent in direct consultation with patient on phone: 7 minutes  08/09/20, FNP-C [facility] 08/05/2020, 2:46 PM

## 2020-08-06 ENCOUNTER — Encounter: Payer: Self-pay | Admitting: Family Medicine

## 2020-08-06 DIAGNOSIS — R0602 Shortness of breath: Secondary | ICD-10-CM | POA: Insufficient documentation

## 2020-08-06 NOTE — Assessment & Plan Note (Signed)
Likely acute viral infection vs booster response after third injection.  Will treat with prednisone 50mg  daily x 5 days and albuterol inhaler 1-2 puffs every 4-6 hours for shortness of breath.  Encouraged to avoid NSAIDs while taking prednisone but can take acetaminophen, according to packaging directions.  Scheduled for COVID swab on Saturday 08/07/20.  Strict ER precautions.  RTC PRN.

## 2020-08-06 NOTE — Assessment & Plan Note (Signed)
See SOB A/P 

## 2020-08-07 ENCOUNTER — Other Ambulatory Visit: Payer: Self-pay

## 2020-08-07 DIAGNOSIS — Z20822 Contact with and (suspected) exposure to covid-19: Secondary | ICD-10-CM

## 2020-08-09 ENCOUNTER — Telehealth: Payer: Self-pay

## 2020-08-09 NOTE — Chronic Care Management (AMB) (Signed)
  Care Management   Note  08/09/2020 Name: Gina Wells MRN: [id number] DOB: 1961-01-26  Gina Wells is a 60 y.o. year old female who is a primary care patient of Anitra Lauth, Jodelle Gross, FNP and is actively engaged with the care management team. I reached out to [name] by phone today to assist with re-scheduling a follow up visit with the Pharmacist  Follow up plan: Patient declines further follow up and engagement by the care management team. Appropriate care team members and provider have been notified via electronic communication.   Penne Lash, RMA Care Guide, Embedded Care Coordination [facility]  [place], [place] [address] Direct Dial: [phone] [email or web address]: [email or web address]

## 2020-08-10 LAB — NOVEL CORONAVIRUS, NAA: SARS-CoV-2, NAA: NOT DETECTED

## 2020-10-14 ENCOUNTER — Emergency Department: Payer: Self-pay

## 2020-10-14 ENCOUNTER — Emergency Department
Admission: EM | Admit: 2020-10-14 | Discharge: 2020-10-14 | Disposition: A | Discharge status: Home / Self Care | Payer: Self-pay | Attending: Emergency Medicine | Admitting: Emergency Medicine

## 2020-10-14 ENCOUNTER — Other Ambulatory Visit: Payer: Self-pay

## 2020-10-14 ENCOUNTER — Encounter: Payer: Self-pay | Admitting: Radiology

## 2020-10-14 DIAGNOSIS — R1084 Generalized abdominal pain: Secondary | ICD-10-CM | POA: Insufficient documentation

## 2020-10-14 DIAGNOSIS — E890 Postprocedural hypothyroidism: Secondary | ICD-10-CM | POA: Insufficient documentation

## 2020-10-14 DIAGNOSIS — F17210 Nicotine dependence, cigarettes, uncomplicated: Secondary | ICD-10-CM | POA: Insufficient documentation

## 2020-10-14 DIAGNOSIS — I10 Essential (primary) hypertension: Secondary | ICD-10-CM | POA: Insufficient documentation

## 2020-10-14 DIAGNOSIS — Z79899 Other long term (current) drug therapy: Secondary | ICD-10-CM | POA: Insufficient documentation

## 2020-10-14 DIAGNOSIS — R140 Abdominal distension (gaseous): Secondary | ICD-10-CM | POA: Insufficient documentation

## 2020-10-14 DIAGNOSIS — R109 Unspecified abdominal pain: Secondary | ICD-10-CM

## 2020-10-14 DIAGNOSIS — K219 Gastro-esophageal reflux disease without esophagitis: Secondary | ICD-10-CM | POA: Insufficient documentation

## 2020-10-14 DIAGNOSIS — R0602 Shortness of breath: Secondary | ICD-10-CM

## 2020-10-14 DIAGNOSIS — R0600 Dyspnea, unspecified: Secondary | ICD-10-CM | POA: Insufficient documentation

## 2020-10-14 LAB — BASIC METABOLIC PANEL
Anion gap: 9 (ref 5–15)
BUN: 17 mg/dL (ref 6–20)
CO2: 23 mmol/L (ref 22–32)
Calcium: 9.5 mg/dL (ref 8.9–10.3)
Chloride: 108 mmol/L (ref 98–111)
Creatinine, Ser: 0.88 mg/dL (ref 0.44–1.00)
GFR, Estimated: >60 mL/min (ref >60)
Glucose, Bld: 98 mg/dL (ref 70–99)
Potassium: 3.6 mmol/L (ref 3.5–5.1)
Sodium: 140 mmol/L (ref 135–145)

## 2020-10-14 LAB — URINALYSIS, COMPLETE (UACMP) WITH MICROSCOPIC
Bacteria, UA: NONE SEEN
Bilirubin Urine: NEGATIVE
Glucose, UA: NEGATIVE mg/dL
Ketones, ur: NEGATIVE mg/dL
Nitrite: NEGATIVE
Protein, ur: NEGATIVE mg/dL
Specific Gravity, Urine: 1.025 (ref 1.005–1.030)
pH: 5 (ref 5.0–8.0)

## 2020-10-14 LAB — LIPASE, BLOOD: Lipase: 34 U/L (ref 11–51)

## 2020-10-14 LAB — CBC
HCT: 41.8 % (ref 36.0–46.0)
Hemoglobin: 13.6 g/dL (ref 12.0–15.0)
MCH: 29.8 pg (ref 26.0–34.0)
MCHC: 32.5 g/dL (ref 30.0–36.0)
MCV: 91.5 fL (ref 80.0–100.0)
Platelets: 238 10*3/uL (ref 150–400)
RBC: 4.57 MIL/uL (ref 3.87–5.11)
RDW: 14.1 % (ref 11.5–15.5)
WBC: 11.5 10*3/uL — ABNORMAL HIGH (ref 4.0–10.5)
nRBC: 0 % (ref 0.0–0.2)

## 2020-10-14 LAB — HEPATIC FUNCTION PANEL
ALT: 14 U/L (ref 0–44)
AST: 13 U/L — ABNORMAL LOW (ref 15–41)
Albumin: 4 g/dL (ref 3.5–5.0)
Alkaline Phosphatase: 93 U/L (ref 38–126)
Bilirubin, Direct: <0.1 mg/dL (ref 0.0–0.2)
Total Bilirubin: 0.7 mg/dL (ref 0.3–1.2)
Total Protein: 7 g/dL (ref 6.5–8.1)

## 2020-10-14 LAB — TROPONIN I (HIGH SENSITIVITY): Troponin I (High Sensitivity): 3 ng/L (ref <18)

## 2020-10-14 MED ORDER — IOHEXOL 300 MG/ML  SOLN
100.0000 mL | Freq: Once | INTRAMUSCULAR | Status: AC | PRN
  Administered 2020-10-14: 100 mL via INTRAVENOUS

## 2020-10-14 MED ORDER — DICYCLOMINE HCL 10 MG PO CAPS: 10.0000 mg | ORAL_CAPSULE | Freq: Three times a day (TID) | ORAL | 0 refills | Status: DC | PRN

## 2020-10-14 MED ORDER — IOHEXOL 9 MG/ML PO SOLN: 500.0000 mL | Freq: Once | ORAL | Status: DC

## 2020-10-14 NOTE — ED Provider Notes (Signed)
[facility] Emergency Department Provider Note  Time seen: 4:13 PM  I have reviewed the triage vital signs and the nursing notes.   HISTORY  Chief Complaint Shortness of Breath   HPI Gina Wells is a 60 y.o. female with a past medical history of anxiety, hypertension, bipolar, gastric reflux, presents to the emergency department multiple complaints.  Cording to the patient for the past 3 to 4 months she has been experiencing abdominal pain which she describes as a pulling sensation mostly in her left abdomen and worse when she has a bowel movement.  Denies any black or bloody stool.  Patient states a sensation of fullness and bloating.  States she has been taking simethicone and omeprazole without relief.  She also states she has been having shortness of breath but she states she is not sure if it is due to her lungs or due to her abdominal bloating or it makes it hard for her to take a deep breath.  Denies any chest pain.  Patient states she has inhalers that she uses at home but has been experiencing minimal relief with those.  Patient denies any known fever or increased cough.   Past Medical History:  Diagnosis Date  . Alcoholism (HCC)   . Anxiety   . Depression   . Fatigue   . Hx of migraine headaches   . Hypertension   . Hyperthyroidism   . Toxic multinodular goiter w/o crisis     Patient Active Problem List   Diagnosis Date Noted  . Shortness of breath 08/06/2020  . Chills 08/05/2020  . Rash and nonspecific skin eruption 10/08/2019  . Severe hypothyroidism 05/19/2018  . Altered level of consciousness 05/19/2018  . Centrilobular emphysema (HCC) 03/08/2017  . 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 Bipolar disorder (HCC) 02/17/2016  . Essential hypertension 11/23/2015  . GERD (gastroesophageal reflux disease) 05/12/2015  . 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 Clinical depression 01/18/2015  . Dysuria-frequency syndrome 01/18/2015  .  Fatigue 01/18/2015  . Encounter for general adult medical examination without abnormal findings 01/18/2015  . Cephalalgia 01/18/2015  . Mechanical and motor problems with internal organs 01/18/2015    Past Surgical History:  Procedure Laterality Date  . KIDNEY STONE SURGERY    . URETERAL STENT PLACEMENT     2002 for kidney stones  . VAGINAL HYSTERECTOMY      Prior to Admission medications   Medication Sig Start Date End Date Taking? Authorizing Provider  albuterol (VENTOLIN HFA) 108 (90 Base) MCG/ACT inhaler Inhale 2 puffs into the lungs every 6 (six) hours as needed for wheezing or shortness of breath. 08/05/20   Malfi, Jodelle Gross, FNP  ANORO ELLIPTA 62.5-25 MCG/INH AEPB Inhale 1 puff by mouth once daily 02/17/19   Galen Manila, NP  levothyroxine (SYNTHROID) 125 MCG tablet Take 1 tablet by mouth once daily 05/26/20   Malfi, Jodelle Gross, FNP  lisinopril-hydrochlorothiazide (ZESTORETIC) 20-25 MG tablet Take 1 tablet by mouth daily. 02/16/20   Malfi, Jodelle Gross, FNP  omeprazole (PRILOSEC) 20 MG capsule Take 1 capsule (20 mg total) by mouth 2 (two) times daily before a meal. 02/16/20   Malfi, Jodelle Gross, FNP  predniSONE (DELTASONE) 50 MG tablet Take 1 tablet daily x 5 days 08/05/20   Malfi, Jodelle Gross, FNP  simethicone (MYLICON) 125 MG chewable tablet Chew 125 mg by mouth every 6 (six) hours as needed for flatulence.    [provider]    No Known Allergies  Family History  Problem Relation Age of Onset  . Diabetes Mother   . Alcohol abuse Father   . Diabetes Father   . Cancer Father   . Cancer Paternal Aunt   . Alcohol abuse Brother     Social History Social History   Tobacco Use  . Smoking status: Current Every Day Smoker    Packs/day: 0.25    Years: 50.00    Pack years: 12.50    Types: Cigarettes    Start date: 07/25/1975  . Smokeless tobacco: Never Used  Vaping Use  . Vaping Use: Never used  Substance Use Topics  . Alcohol use: No    Alcohol/week: 0.0 standard  drinks  . Drug use: No    Review of Systems Constitutional: Negative for fever Cardiovascular: Negative for chest pain. Respiratory: Positive shortness of breath times several months. Gastrointestinal: Abdominal pain in bilateral flanks mostly in left upper quadrant.  States sensation of generalized abdominal bloating.  Denies constipation or diarrhea. Genitourinary: Negative for urinary compaints Musculoskeletal: Negative for musculoskeletal complaints Neurological: Negative for headache All other ROS negative  ____________________________________________   PHYSICAL EXAM:  VITAL SIGNS: ED Triage Vitals  Enc Vitals Group     BP 10/14/20 1532 (!) 143/93     Pulse Rate 10/14/20 1532 76     Resp 10/14/20 1532 (!) 22     Temp 10/14/20 1532 98.2 F (36.8 C)     Temp src --      SpO2 10/14/20 1532 100 %     Weight 10/14/20 1533 190 lb (86.2 kg)     Height 10/14/20 1533 5\' 4"  (1.626 m)     Head Circumference --      Peak Flow --      Pain Score 10/14/20 1532 9     Pain Loc --      Pain Edu? --      Excl. in GC? --    Constitutional: Alert and oriented. Well appearing and in no distress. Eyes: Normal exam ENT      Head: Normocephalic and atraumatic.      Mouth/Throat: Mucous membranes are moist. Cardiovascular: Normal rate, regular rhythm. No murmur Respiratory: Normal respiratory effort without tachypnea nor retractions. Breath sounds are clear and equal bilaterally. No wheezes/rales/rhonchi. Gastrointestinal: Soft, mild distention but dull to percussion.  Mild diffuse tenderness without.  Focal tenderness identified.  No rebound guarding or distention. Musculoskeletal: Nontender with normal range of motion in all extremities. No lower extremity tenderness or edema. Neurologic:  Normal speech and language. No gross focal neurologic deficits  Skin:  Skin is warm, dry and intact.  Psychiatric: Mood and affect are normal.   ____________________________________________     EKG  EKG viewed and interpreted by myself shows a normal sinus rhythm at 73 bpm with a narrow QRS, normal axis, normal intervals, no concerning ST changes.  ____________________________________________    RADIOLOGY  CT is essentially negative for acute abnormality.  Chest x-ray is negative  ____________________________________________   INITIAL IMPRESSION / ASSESSMENT AND PLAN / ED COURSE  Pertinent labs & imaging results that were available during my care of the patient were reviewed by me and considered in my medical decision making (see chart for details).   Patient presents emergency department multiple complaints of shortness of breath abdominal bloating abdominal pain ongoing for the past 3 months.  Patient does have mild abdominal distention but dull percussion.  Mild diffuse tenderness without focal tenderness identified.  States shortness of breath but believes  this is more due to the abdominal bloating than it is her lungs.  Has been using her inhaler at home without relief.  We will check labs including abdominal labs hepatic function panel and lipase we will also check a troponin.  We will obtain a chest x-ray to evaluate the patient's lungs as well as a CT scan of the abdomen/pelvis to evaluate for the patient's complaints of worsening abdominal pain over the past 3 months to rule out oncologic process or other intra-abdominal pathology.  Differential is quite broad would include reflux, gastric ulcers, peptic ulcers, gastritis, esophagitis, IBS, intra-abdominal pathology such as oncologic process, shortness of breath due to COPD, pneumonia, ACS.  Patient's work-up is overall reassuring.  X-ray is negative.  CT scan is negative.  Lab work largely within normal limits.  Patient continues to appear well with reassuring vitals and a reassuring physical exam.  I discussed with the patient trial of Bentyl for her abdominal discomfort to continue taking omeprazole and simethicone and  I will refer her to a GI physician for further evaluation.  Patient agreeable plan of care.  Provided my typical abdominal pain return precautions.  Gina Wells was evaluated in Emergency Department on 10/14/2020 for the symptoms described in the history of present illness. She was evaluated in the context of the global COVID-19 pandemic, which necessitated consideration that the patient might be at risk for infection with the SARS-CoV-2 virus that causes COVID-19. Institutional protocols and algorithms that pertain to the evaluation of patients at risk for COVID-19 are in a state of rapid change based on information released by regulatory bodies including the CDC and federal and state organizations. These policies and algorithms were followed during the patient's care in the ED.  ____________________________________________   FINAL CLINICAL IMPRESSION(S) / ED DIAGNOSES  Abdominal pain Dyspnea   Minna Antis, MD 10/14/20 1851

## 2020-10-14 NOTE — ED Triage Notes (Signed)
Pt states coming in with shortness of breath and bilateral pain under her breasts. Pt states she has had theses symptoms for the last several days and thought it was COPD, but pt states that the feeling is not the same. Pt states when eating, she also gets short of breath and "it feels like the food just sits in my  Chest."

## 2020-10-20 ENCOUNTER — Other Ambulatory Visit: Payer: Self-pay | Admitting: Family Medicine

## 2020-10-20 DIAGNOSIS — E890 Postprocedural hypothyroidism: Secondary | ICD-10-CM

## 2020-10-20 MED ORDER — LEVOTHYROXINE SODIUM 125 MCG PO TABS: 125.0000 ug | ORAL_TABLET | Freq: Every day | ORAL | 0 refills | Status: DC

## 2020-10-20 NOTE — Telephone Encounter (Signed)
Requested medication (s) are due for refill today: yes  Requested medication (s) are on the active medication list: yes  Last refill:  05/26/20 #90 0 refills  Future visit scheduled: no  Notes to clinic:  provider no longer at clinic, can refill per different provider?     Requested Prescriptions  Pending Prescriptions Disp Refills   levothyroxine (SYNTHROID) 125 MCG tablet 90 tablet 0    Sig: Take 1 tablet (125 mcg total) by mouth daily.      Endocrinology:  Hypothyroid Agents Failed - 10/20/2020  1:42 PM      Failed - TSH needs to be rechecked within 3 months after an abnormal result. Refill until TSH is due.      Failed - TSH in normal range and within 360 days    TSH  Date Value Ref Range Status  02/02/2020 14.52 (H) 0.40 - 4.50 mIU/L Final          Passed - Valid encounter within last 12 months    Recent Outpatient Visits           2 months ago Shortness of breath   [facility], Jodelle Gross, FNP   8 months ago Essential hypertension   [facility], Jodelle Gross, FNP   8 months ago Essential hypertension   [facility], Jodelle Gross, FNP   1 year ago Essential hypertension   [facility], Jodelle Gross, FNP   1 year ago COPD exacerbation [facility])   [facility], Netta Neat, DO

## 2020-10-20 NOTE — Telephone Encounter (Signed)
Medication Refill - Medication: levothyroxine (SYNTHROID) 125 MCG tablet Requested since yesterday, completely out of current supply.   Has the patient contacted their pharmacy? Yes.   (Agent: If no, request that the patient contact the pharmacy for the refill.) (Agent: If yes, when and what did the pharmacy advise?)  Preferred Pharmacy (with phone number or street name):  [facility] [address] [place] - [address]  [address] [place] [address]  Phone: [phone] Fax: [phone]     Agent: Please be advised that RX refills may take up to 3 business days. We ask that you follow-up with your pharmacy.

## 2020-12-21 ENCOUNTER — Other Ambulatory Visit: Payer: Self-pay

## 2020-12-21 ENCOUNTER — Emergency Department
Admission: EM | Admit: 2020-12-21 | Discharge: 2020-12-21 | Disposition: A | Discharge status: Home / Self Care | Payer: Self-pay | Attending: Emergency Medicine | Admitting: Emergency Medicine

## 2020-12-21 ENCOUNTER — Emergency Department: Payer: Self-pay

## 2020-12-21 DIAGNOSIS — J449 Chronic obstructive pulmonary disease, unspecified: Secondary | ICD-10-CM | POA: Insufficient documentation

## 2020-12-21 DIAGNOSIS — Z79899 Other long term (current) drug therapy: Secondary | ICD-10-CM | POA: Insufficient documentation

## 2020-12-21 DIAGNOSIS — N390 Urinary tract infection, site not specified: Secondary | ICD-10-CM | POA: Insufficient documentation

## 2020-12-21 DIAGNOSIS — E860 Dehydration: Secondary | ICD-10-CM | POA: Insufficient documentation

## 2020-12-21 DIAGNOSIS — I10 Essential (primary) hypertension: Secondary | ICD-10-CM | POA: Insufficient documentation

## 2020-12-21 DIAGNOSIS — F17210 Nicotine dependence, cigarettes, uncomplicated: Secondary | ICD-10-CM | POA: Insufficient documentation

## 2020-12-21 HISTORY — DX: Chronic obstructive pulmonary disease, unspecified: J44.9

## 2020-12-21 LAB — URINALYSIS, COMPLETE (UACMP) WITH MICROSCOPIC
Bilirubin Urine: NEGATIVE
Glucose, UA: NEGATIVE mg/dL
Ketones, ur: 5 mg/dL — AB
Nitrite: NEGATIVE
Protein, ur: NEGATIVE mg/dL
Specific Gravity, Urine: 1.013 (ref 1.005–1.030)
pH: 5 (ref 5.0–8.0)

## 2020-12-21 LAB — BASIC METABOLIC PANEL
Anion gap: 9 (ref 5–15)
BUN: 20 mg/dL (ref 6–20)
CO2: 26 mmol/L (ref 22–32)
Calcium: 9.1 mg/dL (ref 8.9–10.3)
Chloride: 103 mmol/L (ref 98–111)
Creatinine, Ser: 1.42 mg/dL — ABNORMAL HIGH (ref 0.44–1.00)
GFR, Estimated: 43 mL/min — ABNORMAL LOW (ref >60)
Glucose, Bld: 125 mg/dL — ABNORMAL HIGH (ref 70–99)
Potassium: 3 mmol/L — ABNORMAL LOW (ref 3.5–5.1)
Sodium: 138 mmol/L (ref 135–145)

## 2020-12-21 LAB — CBC
HCT: 38.1 % (ref 36.0–46.0)
Hemoglobin: 12.8 g/dL (ref 12.0–15.0)
MCH: 29.6 pg (ref 26.0–34.0)
MCHC: 33.6 g/dL (ref 30.0–36.0)
MCV: 88.2 fL (ref 80.0–100.0)
Platelets: 234 10*3/uL (ref 150–400)
RBC: 4.32 MIL/uL (ref 3.87–5.11)
RDW: 13.9 % (ref 11.5–15.5)
WBC: 14 10*3/uL — ABNORMAL HIGH (ref 4.0–10.5)
nRBC: 0 % (ref 0.0–0.2)

## 2020-12-21 LAB — LACTIC ACID, PLASMA: Lactic Acid, Venous: 1.3 mmol/L (ref 0.5–1.9)

## 2020-12-21 LAB — HEPATIC FUNCTION PANEL
ALT: 22 U/L (ref 0–44)
AST: 20 U/L (ref 15–41)
Albumin: 3.7 g/dL (ref 3.5–5.0)
Alkaline Phosphatase: 93 U/L (ref 38–126)
Bilirubin, Direct: <0.1 mg/dL (ref 0.0–0.2)
Total Bilirubin: 0.5 mg/dL (ref 0.3–1.2)
Total Protein: 6.8 g/dL (ref 6.5–8.1)

## 2020-12-21 LAB — TROPONIN I (HIGH SENSITIVITY): Troponin I (High Sensitivity): 2 ng/L (ref <18)

## 2020-12-21 MED ORDER — SODIUM CHLORIDE 0.9 % IV BOLUS
1000.0000 mL | Freq: Once | INTRAVENOUS | Status: AC
  Administered 2020-12-21: 1000 mL via INTRAVENOUS

## 2020-12-21 MED ORDER — CEPHALEXIN 500 MG PO CAPS
500.0000 mg | ORAL_CAPSULE | Freq: Once | ORAL | Status: AC
  Administered 2020-12-21: 500 mg via ORAL
  Filled 2020-12-21: qty 1

## 2020-12-21 MED ORDER — CEPHALEXIN 500 MG PO CAPS: 500.0000 mg | ORAL_CAPSULE | Freq: Two times a day (BID) | ORAL | 0 refills | Status: DC

## 2020-12-21 NOTE — ED Triage Notes (Signed)
Pt comes via EMS from work with c/o dizziness, feeling like she is going to pass out and SOB. Pt states she has been feeling like this for few days. Pt states today it got worse.  Pt states some CP

## 2020-12-21 NOTE — ED Triage Notes (Signed)
First Nurse Note:  C/O lightheadedness, dizziness x 1 week. VS:  96/48  P:  86.  18 g RAC  NS infusing..  Arrives via EMS.

## 2020-12-21 NOTE — ED Notes (Addendum)
Pt states she has been feeling week for the last several days. Pt also endorses chest pain. On exam, pt reports a rough sensation to the left face, arm and leg. Pt on cardiac, bp and pulse ox monitor.

## 2020-12-21 NOTE — ED Provider Notes (Signed)
[facility] Emergency Department Provider Note   ____________________________________________    I have reviewed the triage vital signs and the nursing notes.   HISTORY  Chief Complaint Weakness     HPI Gina Wells is a 60 y.o. female who presents with complaints of weakness, dizziness for nearly a week.  Patient reports she has been feeling lightheaded today in particular.  She does not think she has had fevers or chills.  Denies chest pain to me.  No palpitations.  Some  abdominal bloating.  No sick contacts reported.  Past Medical History:  Diagnosis Date  . Alcoholism (HCC)   . Anxiety   . COPD (chronic obstructive pulmonary disease) (HCC)   . Depression   . Fatigue   . Hx of migraine headaches   . Hypertension   . Hyperthyroidism   . Toxic multinodular goiter w/o crisis     Patient Active Problem List   Diagnosis Date Noted  . Shortness of breath 08/06/2020  . Chills 08/05/2020  . Rash and nonspecific skin eruption 10/08/2019  . Severe hypothyroidism 05/19/2018  . Altered level of consciousness 05/19/2018  . Centrilobular emphysema (HCC) 03/08/2017  . 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 Bipolar disorder (HCC) 02/17/2016  . Essential hypertension 11/23/2015  . GERD (gastroesophageal reflux disease) 05/12/2015  . 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 Clinical depression 01/18/2015  . Dysuria-frequency syndrome 01/18/2015  . Fatigue 01/18/2015  . Encounter for general adult medical examination without abnormal findings 01/18/2015  . Cephalalgia 01/18/2015  . Mechanical and motor problems with internal organs 01/18/2015    Past Surgical History:  Procedure Laterality Date  . KIDNEY STONE SURGERY    . URETERAL STENT PLACEMENT     2002 for kidney stones  . VAGINAL HYSTERECTOMY      Prior to Admission medications   Medication Sig Start Date End Date Taking? Authorizing Provider  cephALEXin (KEFLEX) 500 MG  capsule Take 1 capsule (500 mg total) by mouth 2 (two) times daily. 12/21/20  Yes Jene Every, MD  albuterol (VENTOLIN HFA) 108 (90 Base) MCG/ACT inhaler Inhale 2 puffs into the lungs every 6 (six) hours as needed for wheezing or shortness of breath. 08/05/20   Malfi, Jodelle Gross, FNP  ANORO ELLIPTA 62.5-25 MCG/INH AEPB Inhale 1 puff by mouth once daily 02/17/19   Galen Manila, NP  Aspirin-Salicylamide-Caffeine [facility] HEADACHE POWDER PO) Take by mouth 3 (three) times daily as needed.    [provider]  dicyclomine (BENTYL) 10 MG capsule Take 1 capsule (10 mg total) by mouth 3 (three) times daily as needed for up to 14 days for spasms. 10/14/20 10/28/20  Minna Antis, MD  levothyroxine (SYNTHROID) 125 MCG tablet Take 1 tablet (125 mcg total) by mouth daily. 10/20/20   Karamalegos, Netta Neat, DO  lisinopril-hydrochlorothiazide (ZESTORETIC) 20-25 MG tablet Take 1 tablet by mouth daily. 02/16/20   Malfi, Jodelle Gross, FNP  omeprazole (PRILOSEC) 20 MG capsule Take 1 capsule (20 mg total) by mouth 2 (two) times daily before a meal. 02/16/20   Malfi, Jodelle Gross, FNP  simethicone (MYLICON) 125 MG chewable tablet Chew 125 mg by mouth every 6 (six) hours as needed for flatulence.    [provider]     Allergies Patient has no known allergies.  Family History  Problem Relation Age of Onset  . Diabetes Mother   . Alcohol abuse Father   . Diabetes Father   . Cancer Father   . Cancer Paternal  Aunt   . Alcohol abuse Brother     Social History Social History   Tobacco Use  . Smoking status: Current Every Day Smoker    Packs/day: 0.25    Years: 50.00    Pack years: 12.50    Types: Cigarettes    Start date: 07/25/1975  . Smokeless tobacco: Never Used  Vaping Use  . Vaping Use: Never used  Substance Use Topics  . Alcohol use: No    Alcohol/week: 0.0 standard drinks  . Drug use: No    Review of Systems  Constitutional: No fever/chills Eyes: No visual changes.  ENT: No sore  throat. Cardiovascular: Denies chest pain. Respiratory: Denies shortness of breath. Gastrointestinal: As above Genitourinary: Negative for dysuria. Musculoskeletal: Negative for back pain. Skin: Negative for rash. Neurological: Negative for headaches   ____________________________________________   PHYSICAL EXAM:  VITAL SIGNS: ED Triage Vitals  Enc Vitals Group     BP 12/21/20 1726 102/63     Pulse Rate 12/21/20 1726 81     Resp 12/21/20 1726 18     Temp 12/21/20 1726 98.7 F (37.1 C)     Temp Source 12/21/20 1726 Oral     SpO2 12/21/20 1726 96 %     Weight --      Height --      Head Circumference --      Peak Flow --      Pain Score 12/21/20 1726 5     Pain Loc --      Pain Edu? --      Excl. in GC? --     Constitutional: Alert and oriented. No acute distress. Pleasant and interactive  Nose: No congestion/rhinnorhea. Mouth/Throat: Mucous membranes are moist.   Neck:  Painless ROM Cardiovascular: Normal rate, regular rhythm. Grossly normal heart sounds.  Good peripheral circulation. Respiratory: Normal respiratory effort.  No retractions. Lungs CTAB. Gastrointestinal: Mild tenderness and distention.  No CVA tenderness.  Musculoskeletal: No lower extremity tenderness nor edema.  Warm and well perfused Neurologic:  Normal speech and language. No gross focal neurologic deficits are appreciated.  Skin:  Skin is warm, dry and intact. No rash noted. Psychiatric: Mood and affect are normal. Speech and behavior are normal.  ____________________________________________   LABS (all labs ordered are listed, but only abnormal results are displayed)  Labs Reviewed  BASIC METABOLIC PANEL - Abnormal; Notable for the following components:      Result Value   Potassium 3.0 (*)    Glucose, Bld 125 (*)    Creatinine, Ser 1.42 (*)    GFR, Estimated 43 (*)    All other components within normal limits  CBC - Abnormal; Notable for the following components:   WBC 14.0 (*)     All other components within normal limits  URINALYSIS, COMPLETE (UACMP) WITH MICROSCOPIC - Abnormal; Notable for the following components:   Color, Urine YELLOW (*)    APPearance HAZY (*)    Hgb urine dipstick SMALL (*)    Ketones, ur 5 (*)    Leukocytes,Ua LARGE (*)    Bacteria, UA RARE (*)    All other components within normal limits  CULTURE, BLOOD (SINGLE)  HEPATIC FUNCTION PANEL  LACTIC ACID, PLASMA  LACTIC ACID, PLASMA  TROPONIN I (HIGH SENSITIVITY)   ____________________________________________  EKG  ED ECG REPORT I, Jene Every, the attending physician, personally viewed and interpreted this ECG.  Date: 12/21/2020  Rhythm: normal sinus rhythm QRS Axis: normal Intervals: normal ST/T Wave abnormalities: normal Narrative  Interpretation: no evidence of acute ischemia  ____________________________________________  RADIOLOGY  CT abdomen pelvis without acute abnormality Chest x-ray reviewed by me, no acute abnormality ____________________________________________   PROCEDURES  Procedure(s) performed: No  Procedures   Critical Care performed: No ____________________________________________   INITIAL IMPRESSION / ASSESSMENT AND PLAN / ED COURSE  Pertinent labs & imaging results that were available during my care of the patient were reviewed by me and considered in my medical decision making (see chart for details).  Patient initially with slightly low blood pressure, BUN/creatinine ratio mildly elevated.  Suspicious for some dehydration as a cause of her symptoms.  Treated with normal saline bolus with significant improvement.  EKG and high-sensitivity opponent are reassuring.  Work notable for mildly elevated white blood cell count which is nonspecific, lactic acid normal, blood culture sent  Urinalysis demonstrates large leukocytes, patient treated with p.o. antibiotics as blood pressure improved significantly with fluids and she was feeling much better  and impatient to leave.  She knows to return if any worsening of symptoms    ____________________________________________   FINAL CLINICAL IMPRESSION(S) / ED DIAGNOSES  Final diagnoses:  Dehydration  Lower urinary tract infectious disease        Note:  This document was prepared using Dragon voice recognition software and may include unintentional dictation errors.   Jene Every, MD 12/21/20 2151

## 2020-12-21 NOTE — ED Notes (Signed)
Pt requesting to use restroom prior to blood draw, bedside commode brought to room. Pt denies dizziness when standing unassisted to provide UA

## 2020-12-21 NOTE — ED Notes (Signed)
Provider notified of [phone] with left sided sensation changes. Order obtained for IVF. Please see orders. Pt states she has not really been eating or drinking anything the last several days.

## 2020-12-23 ENCOUNTER — Ambulatory Visit: Payer: Self-pay | Admitting: *Deleted

## 2020-12-23 NOTE — Telephone Encounter (Signed)
Pt reports multiple symptoms. Went to ED 12/21/20 "Dehydration." BP 102/63.  Pt states was at workplace and "Almost passed out, EMS called, my BP was something like 87/59 then." Pt reports "Feeling worse today." Lightheaded, spinning at times, worse sitting to standing. Also reports UTI, dx in ED, no symptoms, taking antibiotic. Reports no energy, fatigued all the time, no appetite. Onset of all symptoms 2 weeks ago, "Got better then worse, leading up to Tuesday." States is staying hydrated. Does not check BP at home, does not know what she usually runs. Pt states "Maybe my thyroid." Pt on Levothyroxine and Lisinopril HCTZ 20-25.  HAs been taking as ordered. Pt states she does feel like she will pass out at any time "She moves around." Advised return to ED, declines, states "I know they won't evaluate me well because I don't have insurance." Reiterated need for ED as she feels like she will "Pass out." Declines. Assured pt NT would route to practice for PCPs review and final disposition. CAre advise per protocol. Please advise  Reason for Disposition . SEVERE dizziness (e.g., unable to stand, requires support to walk, feels like passing out now)  Answer Assessment - Initial Assessment Questions 1. DESCRIPTION: "Describe your dizziness."    Floating, foggy feeling 2. LIGHTHEADED: "Do you feel lightheaded?" (e.g., somewhat faint, woozy, weak upon standing)     Yes 3. VERTIGO: "Do you feel like either you or the room is spinning or tilting?" (i.e. vertigo)     sometimes 4. SEVERITY: "How bad is it?"  "Do you feel like you are going to faint?" "Can you stand and walk?"   - MILD: Feels slightly dizzy, but walking normally.   - MODERATE: Feels unsteady when walking, but not falling; interferes with normal activities (e.g., school, work).   - SEVERE: Unable to walk without falling, or requires assistance to walk without falling; feels like passing out now.      moderate 5. ONSET:  "When did the  dizziness begin?"     12/21/20 went to ED.Marland Kitchen Started 2 weeks ago 6. AGGRAVATING FACTORS: "Does anything make it worse?" (e.g., standing, change in head position)     Sitting to standing 7. HEART RATE: "Can you tell me your heart rate?" "How many beats in 15 seconds?"  (Note: not all patients can do this)       no 8. CAUSE: "What do you think is causing the dizziness?"     unsure 9. RECURRENT SYMPTOM: "Have you had dizziness before?" If Yes, ask: "When was the last time?" "What happened that time?"     Yes, resolved on own 10. OTHER SYMPTOMS: "Do you have any other symptoms?" (e.g., fever, chest pain, vomiting, diarrhea, bleeding)      Fatigued, no energy, no appetite 11. PREGNANCY: "Is there any chance you are pregnant?" "When was your last menstrual period?"       *No Answer*  Protocols used: DIZZ[employer]

## 2020-12-26 LAB — CULTURE, BLOOD (SINGLE): Culture: NO GROWTH

## 2021-01-05 ENCOUNTER — Other Ambulatory Visit: Payer: Self-pay

## 2021-01-05 ENCOUNTER — Encounter: Payer: Self-pay | Admitting: Internal Medicine

## 2021-01-05 ENCOUNTER — Ambulatory Visit: Payer: Self-pay | Admitting: Internal Medicine

## 2021-01-05 VITALS — BP 129/69 | HR 66 | Temp 97.3°F | Resp 18 | Ht 64.0 in | Wt 189.2 lb

## 2021-01-05 DIAGNOSIS — E039 Hypothyroidism, unspecified: Secondary | ICD-10-CM

## 2021-01-05 DIAGNOSIS — N3000 Acute cystitis without hematuria: Secondary | ICD-10-CM

## 2021-01-05 DIAGNOSIS — E876 Hypokalemia: Secondary | ICD-10-CM

## 2021-01-05 DIAGNOSIS — N179 Acute kidney failure, unspecified: Secondary | ICD-10-CM

## 2021-01-05 DIAGNOSIS — R7303 Prediabetes: Secondary | ICD-10-CM

## 2021-01-05 DIAGNOSIS — R531 Weakness: Secondary | ICD-10-CM

## 2021-01-05 DIAGNOSIS — Z6832 Body mass index (BMI) 32.0-32.9, adult: Secondary | ICD-10-CM

## 2021-01-05 DIAGNOSIS — E860 Dehydration: Secondary | ICD-10-CM

## 2021-01-05 DIAGNOSIS — R4189 Other symptoms and signs involving cognitive functions and awareness: Secondary | ICD-10-CM

## 2021-01-05 DIAGNOSIS — E6609 Other obesity due to excess calories: Secondary | ICD-10-CM

## 2021-01-05 DIAGNOSIS — R5383 Other fatigue: Secondary | ICD-10-CM

## 2021-01-05 NOTE — Progress Notes (Signed)
Subjective:    Patient ID: Gina Wells, female    DOB: [birth year], 60 y.o.   MRN: [id number]  HPI  Pt presents to the clinic today for ER follow up. She went to the ER 5/31 with complaint of weakness and dizziness x1 week.  Labs revealed hypokalemia, AKI, elevated white count and concern for urinary tract infection.  ECG was unremarkable.  CT abdomen pelvis did not show any acute findings.  Chest x-ray was negative for infiltrate.  She was treated with IV fluids and oral antibiotics.  She was advised to follow-up with her PCP if symptoms were to persist or worsen.  Since discharge, she reports she feels sluggish and has no energy.  She feels like she has lack of motivation and like she cannot think right.  She reports some weakness.  She reports the symptoms have actually been going on for months.  She reports no changes in diet or activity level.  She reports she did stop taking her thyroid medication 2 weeks ago because she felt like it was making her worse.  She does not feel stressed, anxious or depressed.  She is sleeping well.  Her appetite is normal.    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 8 g 0   ANORO ELLIPTA 62.5-25 MCG/INH AEPB Inhale 1 puff by mouth once daily 180 each 0   Aspirin-Salicylamide-Caffeine (BC HEADACHE POWDER PO) Take by mouth 3 (three) times daily as needed.     cephALEXin (KEFLEX) 500 MG capsule Take 1 capsule (500 mg total) by mouth 2 (two) times daily. 14 capsule 0   dicyclomine (BENTYL) 10 MG  capsule Take 1 capsule (10 mg total) by mouth 3 (three) times daily as needed for up to 14 days for spasms. 20 capsule 0   levothyroxine (SYNTHROID) 125 MCG tablet Take 1 tablet (125 mcg total) by mouth daily. 90 tablet 0   lisinopril-hydrochlorothiazide (ZESTORETIC) 20-25 MG tablet Take 1 tablet by mouth daily. 90 tablet 1   omeprazole (PRILOSEC) 20 MG capsule Take 1 capsule (20 mg total) by mouth 2 (two) times daily before a meal. 180 capsule 1   simethicone (MYLICON) 250 MG chewable tablet Chew 125 mg by mouth every 6 (six) hours as needed for flatulence.     No current facility-administered medications for this visit.    No Known Allergies  Family History  Problem Relation Age of Onset   Diabetes Mother    Alcohol abuse Father    Diabetes Father    Cancer Father    Cancer Paternal Aunt    Alcohol abuse Brother     Social History   Socioeconomic History   Marital status: Single    Spouse name: Not on file   Number of children: Not on file   Years of education: Not on file   Highest education level: Not on file  Occupational History   Not on file  Tobacco Use  Smoking status: Every Day    Packs/day: 0.25    Years: 50.00    Pack years: 12.50    Types: Cigarettes    Start date: 07/25/1975   Smokeless tobacco: Never  Vaping Use   Vaping Use: Never used  Substance and Sexual Activity   Alcohol use: No    Alcohol/week: 0.0 standard drinks   Drug use: No   Sexual activity: Yes  Other Topics Concern   Not on file  Social History Narrative   Not on file   Social Determinants of Health   Financial Resource Strain: Not on file  Food Insecurity: Not on file  Transportation Needs: Not on file  Physical Activity: Not on file  Stress: Not on file  Social Connections: Not on file  Intimate Partner Violence: Not on file     Constitutional: Patient reports fatigue.  Denies fever, malaise, headache or abrupt weight changes.  HEENT: Denies eye pain, eye redness, ear  pain, ringing in the ears, wax buildup, runny nose, nasal congestion, bloody nose, or sore throat. Respiratory: Denies difficulty breathing, shortness of breath, cough or sputum production.   Cardiovascular: Denies chest pain, chest tightness, palpitations or swelling in the hands or feet.  Gastrointestinal: Denies abdominal pain, bloating, constipation, diarrhea or blood in the stool.  GU: Denies urgency, frequency, pain with urination, burning sensation, blood in urine, odor or discharge. Musculoskeletal: Patient reports generalized weakness.  Denies decrease in range of motion, difficulty with gait, muscle pain or joint pain and swelling.  Skin: Denies redness, rashes, lesions or ulcercations.  Neurological: Patient reports difficulty thinking, lack of motivation.  Denies dizziness, difficulty with memory, difficulty with speech or problems with balance and coordination.  Psych: Denies anxiety, depression, SI/HI.  No other specific complaints in a complete review of systems (except as listed in HPI above).  Objective:   Physical Exam   BP 129/69 (BP Location: Left Arm, Patient Position: Sitting, Cuff Size: Normal)   Pulse 66   Temp (!) 97.3 F (36.3 C) (Temporal)   Resp 18   Ht 5' 4" (1.626 m)   Wt 189 lb 3.2 oz (85.8 kg)   SpO2 98%   BMI 32.48 kg/m    Wt Readings from Last 3 Encounters:  10/14/20 190 lb (86.2 kg)  02/16/20 186 lb 12.8 oz (84.7 kg)  02/02/20 192 lb 9.6 oz (87.4 kg)    General: Appears their stated age, obese, in NAD. HEENT: Head: normal shape and size; Eyes: sclera white and EOMs intact;  Neck:  Neck supple, trachea midline. No masses, lumps present.  Cardiovascular: Normal rate and rhythm. S1,S2 noted.  No murmur, rubs or gallops noted. No JVD or BLE edema. No carotid bruits noted. Pulmonary/Chest: Normal effort and positive vesicular breath sounds. No respiratory distress. No wheezes, rales or ronchi noted.  Musculoskeletal: Strength 5/5 BUE/BLE.  No  difficulty with gait.  Neurological: Alert and oriented. Coordination normal.  Psychiatric: Mood and affect normal. Behavior is normal. Judgment and thought content normal.    BMET    Component Value Date/Time   NA 138 12/21/2020 1732   NA 140 03/08/2017 1909   K 3.0 (L) 12/21/2020 1732   CL 103 12/21/2020 1732   CO2 26 12/21/2020 1732   GLUCOSE 125 (H) 12/21/2020 1732   BUN 20 12/21/2020 1732   BUN 10 03/08/2017 1909   CREATININE 1.42 (H) 12/21/2020 1732   CREATININE 1.04 02/02/2020 1142   CALCIUM 9.1 12/21/2020 1732   GFRNONAA 43 (L) 12/21/2020  1732   GFRNONAA 59 (L) 02/02/2020 1142   GFRAA 69 02/02/2020 1142    Lipid Panel     Component Value Date/Time   CHOL 188 10/08/2019 0958   CHOL 233 (H) 03/08/2017 1909   TRIG 64 10/08/2019 0958   HDL 59 10/08/2019 0958   HDL 62 03/08/2017 1909   CHOLHDL 3.2 10/08/2019 0958   VLDL 9 05/19/2018 1549   LDLCALC 114 (H) 10/08/2019 0958    CBC    Component Value Date/Time   WBC 14.0 (H) 12/21/2020 1732   RBC 4.32 12/21/2020 1732   HGB 12.8 12/21/2020 1732   HGB 13.0 03/08/2017 1909   HCT 38.1 12/21/2020 1732   HCT 39.8 03/08/2017 1909   PLT 234 12/21/2020 1732   PLT 271 03/08/2017 1909   MCV 88.2 12/21/2020 1732   MCV 89 03/08/2017 1909   MCH 29.6 12/21/2020 1732   MCHC 33.6 12/21/2020 1732   RDW 13.9 12/21/2020 1732   RDW 14.4 03/08/2017 1909   LYMPHSABS 2,897 10/08/2019 0958   LYMPHSABS 3.2 (H) 01/19/2015 0808   MONOABS 0.7 05/19/2018 1116   EOSABS 61 10/08/2019 0958   EOSABS 0.1 01/19/2015 0808   BASOSABS 61 10/08/2019 0958   BASOSABS 0.0 01/19/2015 0808    Hgb A1C Lab Results  Component Value Date   HGBA1C 6.3 (H) 10/08/2019           Assessment & Plan:   ER follow-up for Dehydration, AKI, Hypokalemia, UTI:  ER notes, labs and imaging reviewed She is finished her course of antibiotics and does feel like her UTI is resolved Encourage adequate water intake  Fatigue, Generalized Weakness,  Difficulty Thinking with history of Prediabetes and Hypothyroidism:  I wonder if her sugar is elevated or her thyroid is off because she stopped taking her thyroid medication We will check vitamin D, B12, TSH, free T4, B met, CBC and A1c today-patient is self-pay and could not pay anything towards her labs so she had to leave without getting these done Encouraged adequate rest and stress reduction Encouraged adequate water intake and physical activity  Return precautions discussed Webb Silversmith, NP This visit occurred during the SARS-CoV-2 public health emergency.  Safety protocols were in place, including screening questions prior to the visit, additional usage of staff PPE, and extensive cleaning of exam room while observing appropriate contact time as indicated for disinfecting solutions.

## 2021-01-06 NOTE — Patient Instructions (Signed)

## 2021-02-01 ENCOUNTER — Other Ambulatory Visit: Payer: Self-pay | Admitting: Internal Medicine

## 2021-02-01 DIAGNOSIS — I10 Essential (primary) hypertension: Secondary | ICD-10-CM

## 2021-02-01 MED ORDER — LISINOPRIL-HYDROCHLOROTHIAZIDE 20-25 MG PO TABS: 1.0000 | ORAL_TABLET | Freq: Every day | ORAL | 0 refills | Status: DC

## 2021-02-01 NOTE — Telephone Encounter (Signed)
Medication Refill - Medication:   lisinopril-hydrochlorothiazide (ZESTORETIC) 20-25 MG tablet   Pt called the pharmacy and stated they already faxed the office twice regarding this medication request.     Preferred Pharmacy (with phone number or street name):   [facility] [address] [place] - [address]  [address] [place] [address]  Phone: [phone] Fax: [phone]    Agent: Please be advised that RX refills may take up to 3 business days. We ask that you follow-up with your pharmacy.

## 2021-02-01 NOTE — Telephone Encounter (Signed)
   Notes to clinic:  Patient requesting script that last filled 02/16/2020 Review for refill Patient states request has been sent 2 times    Requested Prescriptions  Pending Prescriptions Disp Refills   lisinopril-hydrochlorothiazide (ZESTORETIC) 20-25 MG tablet 90 tablet 1    Sig: Take 1 tablet by mouth daily.      Cardiovascular:  ACEI + Diuretic Combos Failed - 02/01/2021  2:33 PM      Failed - K in normal range and within 180 days    Potassium  Date Value Ref Range Status  12/21/2020 3.0 (L) 3.5 - 5.1 mmol/L Final          Failed - Cr in normal range and within 180 days    Creat  Date Value Ref Range Status  02/02/2020 1.04 0.50 - 1.05 mg/dL Final    Comment:    For patients >42 years of age, the reference limit for Creatinine is approximately 13% higher for people identified as African-American. .    Creatinine, Ser  Date Value Ref Range Status  12/21/2020 1.42 (H) 0.44 - 1.00 mg/dL Final          Passed - Na in normal range and within 180 days    Sodium  Date Value Ref Range Status  12/21/2020 138 135 - 145 mmol/L Final  03/08/2017 140 134 - 144 mmol/L Final          Passed - Ca in normal range and within 180 days    Calcium  Date Value Ref Range Status  12/21/2020 9.1 8.9 - 10.3 mg/dL Final   Calcium, Ion  Date Value Ref Range Status  05/19/2018 1.30 1.15 - 1.40 mmol/L Final          Passed - Patient is not pregnant      Passed - Last BP in normal range    BP Readings from Last 1 Encounters:  01/05/21 129/69          Passed - Valid encounter within last 6 months    Recent Outpatient Visits           3 weeks ago AKI (acute kidney injury) [facility])   [facility], Salvadore Oxford, NP   6 months ago Shortness of breath   [facility], Jodelle Gross, [place]   11 months ago Essential hypertension   [facility], Jodelle Gross, FNP   1 year ago Essential hypertension   [facility],  Jodelle Gross, FNP   1 year ago Essential hypertension   [facility], Jodelle Gross, [place]

## 2021-02-02 ENCOUNTER — Other Ambulatory Visit: Payer: Self-pay

## 2021-06-06 ENCOUNTER — Telehealth: Payer: Self-pay | Admitting: Pharmacist

## 2021-06-06 NOTE — Telephone Encounter (Signed)
  Chronic Care Management   Outreach Note  06/06/2021 Name: ZYLAH ELSBERND MRN: [id number] DOB: 1960/12/23  Referred by: Lorre Munroe, NP Reason for referral : No chief complaint on file.  Receive a voicemail from patient requesting a call back.   Was unable to reach patient via telephone today and have left HIPAA compliant voicemail asking patient to return my call.   CM Pharmacist previously engaged with patient. Most recently, patient declined further engagement in January 2022   Follow Up Plan: Will await call back from patient  Estelle Grumbles, PharmD, [facility] Clinical Pharmacist [facility] Care Management [phone]

## 2021-06-18 ENCOUNTER — Other Ambulatory Visit: Payer: Self-pay

## 2021-06-18 ENCOUNTER — Emergency Department
Admission: EM | Admit: 2021-06-18 | Discharge: 2021-06-18 | Disposition: A | Discharge status: Home / Self Care | Payer: Self-pay | Attending: Emergency Medicine | Admitting: Emergency Medicine

## 2021-06-18 ENCOUNTER — Encounter: Payer: Self-pay | Admitting: Emergency Medicine

## 2021-06-18 ENCOUNTER — Emergency Department: Payer: Self-pay

## 2021-06-18 DIAGNOSIS — I10 Essential (primary) hypertension: Secondary | ICD-10-CM | POA: Insufficient documentation

## 2021-06-18 DIAGNOSIS — J0110 Acute frontal sinusitis, unspecified: Secondary | ICD-10-CM | POA: Insufficient documentation

## 2021-06-18 DIAGNOSIS — Z20822 Contact with and (suspected) exposure to covid-19: Secondary | ICD-10-CM | POA: Insufficient documentation

## 2021-06-18 DIAGNOSIS — F17210 Nicotine dependence, cigarettes, uncomplicated: Secondary | ICD-10-CM | POA: Insufficient documentation

## 2021-06-18 DIAGNOSIS — J449 Chronic obstructive pulmonary disease, unspecified: Secondary | ICD-10-CM | POA: Insufficient documentation

## 2021-06-18 DIAGNOSIS — Z720 Tobacco use: Secondary | ICD-10-CM

## 2021-06-18 DIAGNOSIS — J40 Bronchitis, not specified as acute or chronic: Secondary | ICD-10-CM | POA: Insufficient documentation

## 2021-06-18 DIAGNOSIS — Z79899 Other long term (current) drug therapy: Secondary | ICD-10-CM | POA: Insufficient documentation

## 2021-06-18 DIAGNOSIS — E039 Hypothyroidism, unspecified: Secondary | ICD-10-CM | POA: Insufficient documentation

## 2021-06-18 LAB — RESP PANEL BY RT-PCR (FLU A&B, COVID) ARPGX2
Influenza A by PCR: NEGATIVE
Influenza B by PCR: NEGATIVE
SARS Coronavirus 2 by RT PCR: NEGATIVE

## 2021-06-18 MED ORDER — AMOXICILLIN-POT CLAVULANATE 875-125 MG PO TABS: 1.0000 | ORAL_TABLET | Freq: Two times a day (BID) | ORAL | 0 refills | Status: AC

## 2021-06-18 MED ORDER — ACETAMINOPHEN 500 MG PO TABS
1000.0000 mg | ORAL_TABLET | Freq: Once | ORAL | Status: AC
  Administered 2021-06-18: 1000 mg via ORAL
  Filled 2021-06-18: qty 2

## 2021-06-18 MED ORDER — IBUPROFEN 400 MG PO TABS
400.0000 mg | ORAL_TABLET | Freq: Once | ORAL | Status: AC
  Administered 2021-06-18: 400 mg via ORAL
  Filled 2021-06-18: qty 1

## 2021-06-18 NOTE — ED Provider Notes (Signed)
Emergency Medicine Provider Triage Evaluation Note  Gina Wells , a 60 y.o. female  was evaluated in triage.  Pt complains of cough, headache, congestion, and fevers.  Patient reports that this for the last week and a half.  She did have contact with her daughter earlier in the month it was confirmed positive for flu.  She noted onset of fevers 3 days prior.  Review of Systems  Positive: Fevers, cough, congestion Negative: NVD  Physical Exam  BP (!) 160/100 (BP Location: Left Arm)   Pulse 73   Temp 98 F (36.7 C) (Oral)   Resp 20   Ht 5\' 4"  (1.626 m)   Wt 83 kg   SpO2 98%   BMI 31.41 kg/m  Gen:   Awake, no distress  NAD Resp:  Normal effort CTA MSK:   Moves extremities without difficulty  Other:  CVS: RRR  Medical Decision Making  Medically screening exam initiated at 1:22 PM.  Appropriate orders placed.  Gina Wells was informed that the remainder of the evaluation will be completed by another provider, this initial triage assessment does not replace that evaluation, and the importance of remaining in the ED until their evaluation is complete.  Patient with ED evaluation of symptoms concerning for influenza after close contact and onset of fevers 3 days ago.   [name], PA-C 06/18/21 1324    06/20/21, MD 06/18/21 1754

## 2021-06-18 NOTE — ED Provider Notes (Signed)
[facility] Emergency Department Provider Note  ____________________________________________   Event Date/Time   First MD Initiated Contact with Patient 06/18/21 1414     (approximate)  I have reviewed the triage vital signs and the nursing notes.   HISTORY  Chief Complaint Cough, Headache, and Nasal Congestion   HPI Gina Wells is a 60 y.o. female with Paschal history of COPD, depression, GERD headaches, HTN, hypothyroidism, EtOH abuse and recent COVID exposure who presents for assessment of approximately 12 days of cough, congestion, sore throat and frontal headache.  No posterior headache, earache, chest pain, shortness of breath, back pain, Donnell pain, vomiting, diarrhea, burning with urination, rash or extremity pain.  Patient does endorse ongoing tobacco abuse.  She tried some Flonase but this did not help.  She has had fevers with highest recorded at 103.  She has been taking Goody powders and Excedrin these have not helped much.  No other acute concerns at this time.         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   Bipolar disorder (HCC) 02/17/2016   Essential hypertension 11/23/2015   GERD (gastroesophageal reflux disease) 05/12/2015   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 Authorizing Provider  amoxicillin-clavulanate (AUGMENTIN) 875-125 MG tablet Take 1 tablet by mouth 2 (two) times daily for 7 days. 06/18/21 06/25/21 Yes Gilles Chiquito, MD  albuterol (VENTOLIN HFA) 108 (90 Base) MCG/ACT inhaler Inhale 2 puffs into the lungs every 6 (six) hours as needed for wheezing or shortness of breath. 08/05/20   Malfi, Jodelle Gross, FNP  ANORO ELLIPTA 62.5-25 MCG/INH AEPB Inhale 1 puff by mouth once daily 02/17/19   Galen Manila, NP  Aspirin-Salicylamide-Caffeine [facility] HEADACHE POWDER PO) Take by mouth 3 (three) times daily as needed.    [provider]  dicyclomine (BENTYL) 10 MG capsule Take 1 capsule (10 mg total) by mouth 3 (three) times daily as needed for up to 14 days for spasms. 10/14/20 10/28/20  Minna Antis, MD  levothyroxine (SYNTHROID) 125 MCG tablet Take 1 tablet (125 mcg total) by mouth daily. Patient not taking: Reported on 01/05/2021 10/20/20   Smitty Cords, DO  lisinopril-hydrochlorothiazide (ZESTORETIC) 20-25 MG tablet Take 1 tablet by mouth daily. 02/01/21   Lorre Munroe, NP  omeprazole (PRILOSEC) 20 MG capsule Take 1 capsule (20 mg total) by mouth 2 (  two) times daily before a meal. 02/16/20   Malfi, Jodelle Gross, FNP  simethicone (MYLICON) 125 MG chewable tablet Chew 125 mg by mouth every 6 (six) hours as needed for flatulence.    [provider]    Allergies Patient has no known allergies.  Family History  Problem Relation Age of Onset   Diabetes Mother    Alcohol abuse Father    Diabetes Father    Cancer Father    Cancer Paternal  Aunt    Alcohol abuse Brother     Social History Social History   Tobacco Use   Smoking status: Every Day    Packs/day: 0.25    Years: 50.00    Pack years: 12.50    Types: Cigarettes    Start date: 07/25/1975   Smokeless tobacco: Never  Vaping Use   Vaping Use: Never used  Substance Use Topics   Alcohol use: No    Alcohol/week: 0.0 standard drinks   Drug use: No    Review of Systems  Review of Systems  Constitutional:  Positive for fever. Negative for chills.  HENT:  Positive for congestion and sore throat.   Eyes:  Negative for pain.  Respiratory:  Positive for cough. Negative for stridor.   Cardiovascular:  Negative for chest pain.  Gastrointestinal:  Negative for vomiting.  Genitourinary:  Negative for dysuria.  Musculoskeletal:  Negative for myalgias.  Skin:  Negative for rash.  Neurological:  Positive for headaches. Negative for seizures and loss of consciousness.  Psychiatric/Behavioral:  Negative for suicidal ideas.   All other systems reviewed and are negative.    ____________________________________________   PHYSICAL EXAM:  VITAL SIGNS: ED Triage Vitals  Enc Vitals Group     BP 06/18/21 1322 (!) 160/100     Pulse Rate 06/18/21 1318 73     Resp 06/18/21 1318 20     Temp 06/18/21 1318 98 F (36.7 C)     Temp Source 06/18/21 1318 Oral     SpO2 06/18/21 1318 98 %     Weight 06/18/21 1320 183 lb (83 kg)     Height 06/18/21 1320 5\' 4"  (1.626 m)     Head Circumference --      Peak Flow --      Pain Score 06/18/21 1320 10     Pain Loc --      Pain Edu? --      Excl. in GC? --    Vitals:   06/18/21 1318 06/18/21 1322  BP:  (!) 160/100  Pulse: 73   Resp: 20   Temp: 98 F (36.7 C)   SpO2: 98%    Physical Exam Vitals and nursing note reviewed.  Constitutional:      General: She is not in acute distress.    Appearance: She is well-developed.  HENT:     Head: Normocephalic and atraumatic.     Right Ear: External ear normal.     Left Ear:  External ear normal.     Nose: Nose normal.     Mouth/Throat:     Mouth: Mucous membranes are moist.  Eyes:     Conjunctiva/sclera: Conjunctivae normal.  Cardiovascular:     Rate and Rhythm: Normal rate and regular rhythm.     Heart sounds: No murmur heard. Pulmonary:     Effort: Pulmonary effort is normal. No respiratory distress.     Breath sounds: Normal breath sounds.  Abdominal:     Palpations: Abdomen is soft.  Tenderness: There is no abdominal tenderness.  Musculoskeletal:        General: No swelling.     Cervical back: Neck supple.  Skin:    General: Skin is warm and dry.     Capillary Refill: Capillary refill takes less than 2 seconds.  Neurological:     Mental Status: She is alert and oriented to person, place, and time.  Psychiatric:        Mood and Affect: Mood normal.    Some mild anterior cervical lymphadenopathy.  Bilateral margin of the tonsils without uvular deviation or significant exudates.  Oropharynx is otherwise unremarkable.  There is no stridor over the neck.  Lungs are clear bilaterally.  Cranial nerves II through XII are grossly intact.  Patient is symmetric strength in upper and lower extremities. ____________________________________________   LABS (all labs ordered are listed, but only abnormal results are displayed)  Labs Reviewed  RESP PANEL BY RT-PCR (FLU A&B, COVID) [id number]   ____________________________________________  EKG   ____________________________________________  RADIOLOGY  ED MD interpretation: Chest x-ray shows no focal consolidation, effusion, edema, pneumothorax, rib fracture or any other acute thoracic process.  Official radiology report(s): DG Chest 2 View  Result Date: 06/18/2021 CLINICAL DATA:  Cough for 1 week.  Fever.  Headache and congestion. EXAM: CHEST - 2 VIEW COMPARISON:  12/21/2020. FINDINGS: Cardiac silhouette is normal in size. Normal mediastinal and hilar contours. Lungs are clear.  No pleural effusion or  pneumothorax. Skeletal structures are intact. IMPRESSION: No active cardiopulmonary disease. Electronically Signed   By: Amie Portland M.D.   On: 06/18/2021 13:59    ____________________________________________   PROCEDURES  Procedure(s) performed (including Critical Care):  Procedures   ____________________________________________   INITIAL IMPRESSION / ASSESSMENT AND PLAN / ED COURSE      Patient presents with above-stated history exam for assessment 12 days of cough, congestion, sore throat and fevers.  On arrival patient is hypertensive with otherwise stable vital signs on room air.  She does have some oropharyngeal erythema and tonsillar enlargement but otherwise totally unremarkable exam.  Impression is likely viral bronchitis with possible component of bacterial sinusitis as well given duration and reported associated fevers.  We will cover this with a course of Augmentin.  At this time I do not think she is septic and I do not see evidence of other deep space infection head or neck.  Chest x-ray shows no focal consolidation, effusion, edema, pneumothorax, rib fracture or any other acute thoracic process.  COVID influenza PCR is negative.  Suspect likely bronchitis although is no significant wheezing proximal or increased respiratory distress or time emergent bronchodilator therapy or steroids.  Emphasized importance of tobacco cessation.  Advise discontinuing over-the-counter cold medicines and Goody powders in favor of Tylenol ibuprofen.  Patient is amenable this plan.  Discussed importance of tobacco cessation.  Discharged stable condition.  Strict return precautions advised and discussed.  Advised to have blood pressure rechecked next week this is very elevated today.   ____________________________________________   FINAL CLINICAL IMPRESSION(S) / ED DIAGNOSES  Final diagnoses:  Acute frontal sinusitis, recurrence not specified  Bronchitis  Hypertension, unspecified  type  Tobacco abuse    Medications  acetaminophen (TYLENOL) tablet 1,000 mg (has no administration in time range)  ibuprofen (ADVIL) tablet 400 mg (has no administration in time range)     ED Discharge Orders          Ordered    amoxicillin-clavulanate (AUGMENTIN) 875-125 MG tablet  2 times daily  06/18/21 1437             Note:  This document was prepared using Dragon voice recognition software and may include unintentional dictation errors.    Gilles Chiquito, MD 06/18/21 1440

## 2021-06-18 NOTE — ED Triage Notes (Signed)
Pt via POV from home. Pt c/o headache, cough, nasal congestion, and sore throat for about 12 days. Pt states that her granddaughter had the flu in the beginning of November. Pt is A&Ox4 and NAD.

## 2021-06-28 ENCOUNTER — Other Ambulatory Visit: Payer: Self-pay | Admitting: Internal Medicine

## 2021-06-28 ENCOUNTER — Other Ambulatory Visit: Payer: Self-pay | Admitting: Family Medicine

## 2021-06-28 DIAGNOSIS — E890 Postprocedural hypothyroidism: Secondary | ICD-10-CM

## 2021-06-28 DIAGNOSIS — I10 Essential (primary) hypertension: Secondary | ICD-10-CM

## 2021-06-28 NOTE — Telephone Encounter (Signed)
Requested medications are due for refill today.  unsure  Requested medications are on the active medications list.  yes  Last refill. 10/20/2020  Future visit scheduled.   no  Notes to clinic.  Failed protocol d/t abnormal labs. Pt reported not taking this medication on 01/05/2021.    Requested Prescriptions  Pending Prescriptions Disp Refills   levothyroxine (SYNTHROID) 125 MCG tablet [Pharmacy Med Name: Levothyroxine Sodium 125 MCG Oral Tablet] 90 tablet 0    Sig: Take 1 tablet by mouth once daily     Endocrinology:  Hypothyroid Agents Failed - 06/28/2021  4:29 PM      Failed - TSH needs to be rechecked within 3 months after an abnormal result. Refill until TSH is due.      Failed - TSH in normal range and within 360 days    TSH  Date Value Ref Range Status  02/02/2020 14.52 (H) 0.40 - 4.50 mIU/L Final          Passed - Valid encounter within last 12 months    Recent Outpatient Visits           5 months ago AKI (acute kidney injury) [facility])   [facility] [place], Salvadore Oxford, NP   10 months ago Shortness of breath   [facility], Jodelle Gross, FNP   1 year ago Essential hypertension   [facility], Jodelle Gross, FNP   1 year ago Essential hypertension   [facility], Jodelle Gross, FNP   1 year ago Essential hypertension   [facility], Jodelle Gross, [place]

## 2021-06-28 NOTE — Telephone Encounter (Signed)
Requested medications are due for refill today.  yes  Requested medications are on the active medications list.  yes  Last refill. 02/01/2021  Future visit scheduled.   no  Notes to clinic.  Failed  protocol d/t abnormal labs.    Requested Prescriptions  Pending Prescriptions Disp Refills   lisinopril-hydrochlorothiazide (ZESTORETIC) 20-25 MG tablet [Pharmacy Med Name: Lisinopril-hydroCHLOROthiazide 20-25 MG Oral Tablet] 30 tablet 0    Sig: Take 1 tablet by mouth once daily     Cardiovascular:  ACEI + Diuretic Combos Failed - 06/28/2021  4:29 PM      Failed - Na in normal range and within 180 days    Sodium  Date Value Ref Range Status  12/21/2020 138 135 - 145 mmol/L Final  03/08/2017 140 134 - 144 mmol/L Final          Failed - K in normal range and within 180 days    Potassium  Date Value Ref Range Status  12/21/2020 3.0 (L) 3.5 - 5.1 mmol/L Final          Failed - Cr in normal range and within 180 days    Creat  Date Value Ref Range Status  02/02/2020 1.04 0.50 - 1.05 mg/dL Final    Comment:    For patients >35 years of age, the reference limit for Creatinine is approximately 13% higher for people identified as African-American. .    Creatinine, Ser  Date Value Ref Range Status  12/21/2020 1.42 (H) 0.44 - 1.00 mg/dL Final          Failed - Ca in normal range and within 180 days    Calcium  Date Value Ref Range Status  12/21/2020 9.1 8.9 - 10.3 mg/dL Final   Calcium, Ion  Date Value Ref Range Status  05/19/2018 1.30 1.15 - 1.40 mmol/L Final          Failed - Last BP in normal range    BP Readings from Last 1 Encounters:  06/18/21 (!) 160/100          Passed - Patient is not pregnant      Passed - Valid encounter within last 6 months    Recent Outpatient Visits           5 months ago AKI (acute kidney injury) (HCC)   [facility] [place], Salvadore Oxford, NP   10 months ago Shortness of breath   [facility],  Jodelle Gross, FNP   1 year ago Essential hypertension   [facility] [place], Jodelle Gross, FNP   1 year ago Essential hypertension   [facility], Jodelle Gross, FNP   1 year ago Essential hypertension   [facility], Jodelle Gross, [place]

## 2021-07-04 ENCOUNTER — Ambulatory Visit: Payer: Self-pay | Admitting: Internal Medicine

## 2021-07-28 ENCOUNTER — Other Ambulatory Visit: Payer: Self-pay | Admitting: Internal Medicine

## 2021-07-28 DIAGNOSIS — I10 Essential (primary) hypertension: Secondary | ICD-10-CM

## 2021-07-29 ENCOUNTER — Other Ambulatory Visit: Payer: Self-pay | Admitting: Family Medicine

## 2021-07-29 DIAGNOSIS — E890 Postprocedural hypothyroidism: Secondary | ICD-10-CM

## 2021-07-29 NOTE — Telephone Encounter (Signed)
Requested medication (s) are due for refill today: yes  Requested medication (s) are on the active medication list: yes  Last refill:  02/01/21  Future visit scheduled: no  Notes to clinic:  Has already had a curtesy refill then NO SHOW and there is no upcoming appointment scheduled. Was seen in June 2022.  Requested Prescriptions  Pending Prescriptions Disp Refills   lisinopril-hydrochlorothiazide (ZESTORETIC) 20-25 MG tablet [Pharmacy Med Name: Lisinopril-hydroCHLOROthiazide 20-25 MG Oral Tablet] 30 tablet 0    Sig: Take 1 tablet by mouth once daily     Cardiovascular:  ACEI + Diuretic Combos Failed - 07/28/2021  6:00 PM      Failed - Na in normal range and within 180 days    Sodium  Date Value Ref Range Status  12/21/2020 138 135 - 145 mmol/L Final  03/08/2017 140 134 - 144 mmol/L Final          Failed - K in normal range and within 180 days    Potassium  Date Value Ref Range Status  12/21/2020 3.0 (L) 3.5 - 5.1 mmol/L Final          Failed - Cr in normal range and within 180 days    Creat  Date Value Ref Range Status  02/02/2020 1.04 0.50 - 1.05 mg/dL Final    Comment:    For patients >16 years of age, the reference limit for Creatinine is approximately 13% higher for people identified as African-American. .    Creatinine, Ser  Date Value Ref Range Status  12/21/2020 1.42 (H) 0.44 - 1.00 mg/dL Final          Failed - Ca in normal range and within 180 days    Calcium  Date Value Ref Range Status  12/21/2020 9.1 8.9 - 10.3 mg/dL Final   Calcium, Ion  Date Value Ref Range Status  05/19/2018 1.30 1.15 - 1.40 mmol/L Final          Failed - Last BP in normal range    BP Readings from Last 1 Encounters:  06/18/21 (!) 160/100          Failed - Valid encounter within last 6 months    Recent Outpatient Visits           6 months ago AKI (acute kidney injury) [facility])   [facility] [place], Coralie Keens, NP   11 months ago Shortness of breath    [facility], Lupita Raider, FNP   1 year ago Essential hypertension   [place], [place]   1 year ago Essential hypertension   [place], FNP   1 year ago Essential hypertension   [facility], Lupita Raider, [place]              Passed - Patient is not pregnant

## 2021-07-29 NOTE — Telephone Encounter (Signed)
Requested medication (s) are due for refill today: yes  Requested medication (s) are on the active medication list: yes  Last refill:  10/20/20 #90/0  Future visit scheduled: yes  Notes to clinic:  Unable to refill per protocol due to failed labs, no updated results.      Requested Prescriptions  Pending Prescriptions Disp Refills   levothyroxine (SYNTHROID) 125 MCG tablet [Pharmacy Med Name: Levothyroxine Sodium 125 MCG Oral Tablet] 90 tablet 0    Sig: Take 1 tablet by mouth once daily     Endocrinology:  Hypothyroid Agents Failed - 07/29/2021  3:04 PM      Failed - TSH needs to be rechecked within 3 months after an abnormal result. Refill until TSH is due.      Failed - TSH in normal range and within 360 days    TSH  Date Value Ref Range Status  02/02/2020 14.52 (H) 0.40 - 4.50 mIU/L Final          Passed - Valid encounter within last 12 months    Recent Outpatient Visits           6 months ago AKI (acute kidney injury) [facility])   [facility], Coralie Keens, NP   11 months ago Shortness of breath   [facility] [place], Lupita Raider, FNP   1 year ago Essential hypertension   [place], FNP   1 year ago Essential hypertension   [place], [place]   1 year ago Essential hypertension   [facility], Lupita Raider, FNP       Future Appointments             In 4 days [place], Coralie Keens, NP [facility], [facility]

## 2021-08-02 ENCOUNTER — Other Ambulatory Visit: Payer: Self-pay

## 2021-08-02 ENCOUNTER — Encounter: Payer: Self-pay | Admitting: Internal Medicine

## 2021-08-02 ENCOUNTER — Ambulatory Visit (INDEPENDENT_AMBULATORY_CARE_PROVIDER_SITE_OTHER): Payer: 59 | Admitting: Internal Medicine

## 2021-08-02 VITALS — BP 154/80 | HR 70 | Temp 97.6°F | Ht 64.0 in | Wt 188.0 lb

## 2021-08-02 DIAGNOSIS — G43909 Migraine, unspecified, not intractable, without status migrainosus: Secondary | ICD-10-CM | POA: Insufficient documentation

## 2021-08-02 DIAGNOSIS — Z6832 Body mass index (BMI) 32.0-32.9, adult: Secondary | ICD-10-CM

## 2021-08-02 DIAGNOSIS — J439 Emphysema, unspecified: Secondary | ICD-10-CM

## 2021-08-02 DIAGNOSIS — R7303 Prediabetes: Secondary | ICD-10-CM

## 2021-08-02 DIAGNOSIS — E890 Postprocedural hypothyroidism: Secondary | ICD-10-CM | POA: Diagnosis not present

## 2021-08-02 DIAGNOSIS — N1832 Chronic kidney disease, stage 3b: Secondary | ICD-10-CM

## 2021-08-02 DIAGNOSIS — K219 Gastro-esophageal reflux disease without esophagitis: Secondary | ICD-10-CM

## 2021-08-02 DIAGNOSIS — I10 Essential (primary) hypertension: Secondary | ICD-10-CM | POA: Diagnosis not present

## 2021-08-02 DIAGNOSIS — R0602 Shortness of breath: Secondary | ICD-10-CM | POA: Diagnosis not present

## 2021-08-02 DIAGNOSIS — E7800 Pure hypercholesterolemia, unspecified: Secondary | ICD-10-CM

## 2021-08-02 DIAGNOSIS — G43C1 Periodic headache syndromes in child or adult, intractable: Secondary | ICD-10-CM

## 2021-08-02 DIAGNOSIS — J449 Chronic obstructive pulmonary disease, unspecified: Secondary | ICD-10-CM | POA: Insufficient documentation

## 2021-08-02 DIAGNOSIS — E785 Hyperlipidemia, unspecified: Secondary | ICD-10-CM | POA: Insufficient documentation

## 2021-08-02 DIAGNOSIS — N183 Chronic kidney disease, stage 3 unspecified: Secondary | ICD-10-CM | POA: Insufficient documentation

## 2021-08-02 DIAGNOSIS — F419 Anxiety disorder, unspecified: Secondary | ICD-10-CM

## 2021-08-02 DIAGNOSIS — F32A Depression, unspecified: Secondary | ICD-10-CM

## 2021-08-02 DIAGNOSIS — E6609 Other obesity due to excess calories: Secondary | ICD-10-CM | POA: Insufficient documentation

## 2021-08-02 DIAGNOSIS — I700 Atherosclerosis of aorta: Secondary | ICD-10-CM | POA: Insufficient documentation

## 2021-08-02 DIAGNOSIS — E66811 Obesity, class 1: Secondary | ICD-10-CM | POA: Insufficient documentation

## 2021-08-02 MED ORDER — ALBUTEROL SULFATE HFA 108 (90 BASE) MCG/ACT IN AERS: 2.0000 | INHALATION_SPRAY | Freq: Four times a day (QID) | RESPIRATORY_TRACT | 0 refills | Status: DC | PRN

## 2021-08-02 MED ORDER — FLUTICASONE-SALMETEROL 100-50 MCG/ACT IN AEPB: 1.0000 | INHALATION_SPRAY | Freq: Two times a day (BID) | RESPIRATORY_TRACT | 3 refills | Status: DC

## 2021-08-02 NOTE — Assessment & Plan Note (Signed)
Uncontrolled Increase Lisinopril HCT to 2 tabs daily Reinforced DASH diet and exercise for weight loss C-Met today  RTC in 1 week for blood pressure recheck

## 2021-08-02 NOTE — Assessment & Plan Note (Signed)
Currently not an issue off meds Support offered 

## 2021-08-02 NOTE — Assessment & Plan Note (Signed)
C-Met and lipid profile today Encouraged her to consume a low-fat diet Consider statin therapy if LDL >100 

## 2021-08-02 NOTE — Patient Instructions (Signed)

## 2021-08-02 NOTE — Assessment & Plan Note (Signed)
A1c today Encouraged her to consume a low-carb diet and exercise for weight loss 

## 2021-08-02 NOTE — Assessment & Plan Note (Signed)
Encourage weight loss as this can help reduce reflux symptoms ?Continue Omeprazole ?

## 2021-08-02 NOTE — Assessment & Plan Note (Signed)
Encourage diet and exercise for weight loss 

## 2021-08-02 NOTE — Assessment & Plan Note (Signed)
C-Met today She should avoid anti-inflammatories including BC's and Goody's powders OTC Encouraged adequate water intake

## 2021-08-02 NOTE — Progress Notes (Signed)
Subjective:    Patient ID: Gina Wells, female    DOB: October 30, 1960, 61 y.o.   MRN: [id number]  HPI  Patient presents the clinic today for follow-up of chronic conditions.  Anxiety and Depression: Chronic but she is not currently taking any medications for this.  She is not currently seeing a therapist.  She denies SI/HI.  COPD: She reports chronic cough and shortness of breath. She is using Albuterol as needed. She is not taking Anoro Ellipta because she did not feel like it was effective and it was too expensive.. She is currently smoking.  There are no PFTs on file.  Migraines: She is having daily headaches. The headaches are located in her forehead.  She describes the pain as throbbing.  She denies associated dizziness, vision changes, sensitivity to light or sound.  She is taking Goody's powders daily with some relief of symptoms.  She does not follow with neurology.  GERD: She denies breakthrough on Omeprazole.  There is no upper GI on file. Hyperthyroidism: She is taking  HTN: Her BP today is 154/80.  She is taking Lisinopril HCTZ as prescribed.  She has been having daily headaches and is not sure if this was related to her blood pressure or migraines.  ECG from 11/2020 reviewed.  Hypothyroidism: Post ablative.  She denies any issues on her current dose of Levothyroxine.  She does not follow with endocrinology.  CKD 3: Her last creatinine was 1.42, GFR 43.  She is onLlisinopril for renal protection.  She does not follow with nephrology.  Prediabetes: Her last A1c was 6.3%, 09/2019.  She is not currently taking any oral diabetic medication at this time.  She does not check her sugars.  HLD with Aortic Atherosclerosis: Her last LDL was 114, triglycerides 64, 09/2019.  She is not taking any cholesterol-lowering medication at this time.  She does not consume a low-fat diet.  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 8 g 0   ANORO ELLIPTA 62.5-25 MCG/INH AEPB Inhale 1 puff by mouth once daily 180 each 0   Aspirin-Salicylamide-Caffeine (BC HEADACHE POWDER PO) Take by mouth 3 (three) times daily as needed.     dicyclomine (BENTYL) 10 MG capsule Take 1 capsule (10 mg total) by mouth 3 (three) times daily as needed for up to 14 days for spasms. 20 capsule 0   levothyroxine (SYNTHROID) 125 MCG tablet Take 1 tablet by mouth once daily 90 tablet 0   lisinopril-hydrochlorothiazide (ZESTORETIC) 20-25 MG tablet Take 1 tablet by mouth once daily 30 tablet 0   omeprazole (PRILOSEC) 20 MG capsule Take 1 capsule (20 mg total) by mouth 2 (two) times daily before a meal. 180 capsule 1   simethicone (MYLICON) 125 MG chewable tablet Chew 125 mg by mouth every 6 (six) hours as needed for flatulence.     No current facility-administered medications for this visit.    No Known Allergies  Family History  Problem Relation Age of Onset   Diabetes Mother    Alcohol abuse Father    Diabetes Father    Cancer Father    Cancer Paternal Aunt    Alcohol abuse Brother     Social History   Socioeconomic History   Marital status: Single    Spouse name: Not on file   Number of children: Not on file   Years of education: Not on file   Highest education level: Not on file  Occupational History   Not on file  Tobacco Use   Smoking status: Every Day    Packs/day: 0.25    Years: 50.00    Pack years: 12.50    Types: Cigarettes    Start date: 07/25/1975   Smokeless tobacco: Never  Vaping Use   Vaping Use: Never used  Substance and  Sexual Activity   Alcohol use: No    Alcohol/week: 0.0 standard drinks   Drug use: No   Sexual activity: Yes  Other Topics Concern   Not on file  Social History Narrative   Not on file   Social Determinants of Health   Financial Resource Strain: Not on file  Food Insecurity: Not on file  Transportation Needs: Not on file  Physical Activity: Not on file  Stress: Not on file  Social Connections: Not on file  Intimate Partner Violence: Not on file     Constitutional: Patient reports headaches.  Denies fever, malaise, fatigue, or abrupt weight changes.  HEENT: Denies eye pain, eye redness, ear pain, ringing in the ears, wax buildup, runny nose, nasal congestion, bloody nose, or sore throat. Respiratory: Patient reports chronic cough and shortness of breath.  Denies difficulty breathing or sputum production.   Cardiovascular: Denies chest pain, chest tightness, palpitations or swelling in the hands or feet.  Gastrointestinal: Denies abdominal pain, bloating, constipation, diarrhea or blood in the stool.  GU: Denies urgency, frequency, pain with urination, burning sensation, blood in urine, odor or discharge. Musculoskeletal: Denies decrease in range of motion, difficulty with gait, muscle pain or joint pain and swelling.  Skin: Denies redness, rashes, lesions or ulcercations.  Neurological: Denies dizziness, difficulty with memory, difficulty with speech or problems with balance and coordination.  Psych: Patient has a history of anxiety and depression.  Denies SI/HI.  No other specific complaints in a complete review of systems (except as listed in HPI above).  Objective:   Physical Exam BP (!) 154/80    Pulse 70    Temp 97.6 F (36.4 C) (Oral)    Ht 5\' 4"  (1.626 m)    Wt 188 lb (85.3 kg)    SpO2 100%    BMI 32.27 kg/m   Wt Readings from Last 3 Encounters:  08/02/21 188 lb (85.3 kg)  06/18/21 183 lb (83 kg)  01/05/21 189 lb 3.2 oz (85.8 kg)    General: Appears her stated  age, obese, in NAD. Skin: Warm, dry and intact.  HEENT: Head: normal shape and size; Eyes: sclera white and EOMs intact;  Neck:  Neck supple, trachea midline. No masses, lumps or thyromegaly present.  Cardiovascular: Normal rate and rhythm. S1,S2 noted.  No murmur, rubs or gallops noted. No JVD or BLE edema. No carotid bruits noted. Pulmonary/Chest: Normal effort and positive vesicular breath sounds. No respiratory distress. No wheezes, rales or ronchi noted.  Abdomen: Normal bowel sounds.  Musculoskeletal: No difficulty with gait.  Neurological: Alert and oriented.  Psychiatric: Mood and affect mildly flat. Behavior is normal. Judgment  and thought content normal.     BMET    Component Value Date/Time   NA 138 12/21/2020 1732   NA 140 03/08/2017 1909   K 3.0 (L) 12/21/2020 1732   CL 103 12/21/2020 1732   CO2 26 12/21/2020 1732   GLUCOSE 125 (H) 12/21/2020 1732   BUN 20 12/21/2020 1732   BUN 10 03/08/2017 1909   CREATININE 1.42 (H) 12/21/2020 1732   CREATININE 1.04 02/02/2020 1142   CALCIUM 9.1 12/21/2020 1732   GFRNONAA 43 (L) 12/21/2020 1732   GFRNONAA 59 (L) 02/02/2020 1142   GFRAA 69 02/02/2020 1142    Lipid Panel     Component Value Date/Time   CHOL 188 10/08/2019 0958   CHOL 233 (H) 03/08/2017 1909   TRIG 64 10/08/2019 0958   HDL 59 10/08/2019 0958   HDL 62 03/08/2017 1909   CHOLHDL 3.2 10/08/2019 0958   VLDL 9 05/19/2018 1549   LDLCALC 114 (H) 10/08/2019 0958    CBC    Component Value Date/Time   WBC 14.0 (H) 12/21/2020 1732   RBC 4.32 12/21/2020 1732   HGB 12.8 12/21/2020 1732   HGB 13.0 03/08/2017 1909   HCT 38.1 12/21/2020 1732   HCT 39.8 03/08/2017 1909   PLT 234 12/21/2020 1732   PLT 271 03/08/2017 1909   MCV 88.2 12/21/2020 1732   MCV 89 03/08/2017 1909   MCH 29.6 12/21/2020 1732   MCHC 33.6 12/21/2020 1732   RDW 13.9 12/21/2020 1732   RDW 14.4 03/08/2017 1909   LYMPHSABS 2,897 10/08/2019 0958   LYMPHSABS 3.2 (H) 01/19/2015 0808   MONOABS  0.7 05/19/2018 1116   EOSABS 61 10/08/2019 0958   EOSABS 0.1 01/19/2015 0808   BASOSABS 61 10/08/2019 0958   BASOSABS 0.0 01/19/2015 0808    Hgb A1C Lab Results  Component Value Date   HGBA1C 6.3 (H) 10/08/2019            Assessment & Plan:     Gina Reaperegina Steffany Schoenfelder, NP This visit occurred during the SARS-CoV-2 public health emergency.  Safety protocols were in place, including screening questions prior to the visit, additional usage of staff PPE, and extensive cleaning of exam room while observing appropriate contact time as indicated for disinfecting solutions.

## 2021-08-02 NOTE — Assessment & Plan Note (Signed)
I wonder if this is related to her blood pressure Blood pressure medication adjusted today Consider daily preventative medication for migraines if this does not improve

## 2021-08-02 NOTE — Assessment & Plan Note (Signed)
We will start Advair Albuterol refilled today Encourage smoking cessation

## 2021-08-02 NOTE — Assessment & Plan Note (Signed)
TSH and free T4 today Will adjust Levothyroxine if needed based on labs 

## 2021-08-03 LAB — COMPLETE METABOLIC PANEL WITH GFR
AG Ratio: 1.5 (calc) (ref 1.0–2.5)
ALT: 9 U/L (ref 6–29)
AST: 12 U/L (ref 10–35)
Albumin: 4 g/dL (ref 3.6–5.1)
Alkaline phosphatase (APISO): 125 U/L (ref 37–153)
BUN: 22 mg/dL (ref 7–25)
CO2: 27 mmol/L (ref 20–32)
Calcium: 9.8 mg/dL (ref 8.6–10.4)
Chloride: 106 mmol/L (ref 98–110)
Creat: 0.93 mg/dL (ref 0.50–1.05)
Globulin: 2.7 g/dL (calc) (ref 1.9–3.7)
Glucose, Bld: 95 mg/dL (ref 65–99)
Potassium: 4.2 mmol/L (ref 3.5–5.3)
Sodium: 143 mmol/L (ref 135–146)
Total Bilirubin: 0.3 mg/dL (ref 0.2–1.2)
Total Protein: 6.7 g/dL (ref 6.1–8.1)
eGFR: 70 mL/min/{1.73_m2} (ref >=60)

## 2021-08-03 LAB — HEMOGLOBIN A1C
Hgb A1c MFr Bld: 5.9 % of total Hgb — ABNORMAL HIGH (ref <5.7)
Mean Plasma Glucose: 123 mg/dL
eAG (mmol/L): 6.8 mmol/L

## 2021-08-03 LAB — CBC
HCT: 39.4 % (ref 35.0–45.0)
Hemoglobin: 12.9 g/dL (ref 11.7–15.5)
MCH: 30.4 pg (ref 27.0–33.0)
MCHC: 32.7 g/dL (ref 32.0–36.0)
MCV: 92.7 fL (ref 80.0–100.0)
MPV: 10.1 fL (ref 7.5–12.5)
Platelets: 284 10*3/uL (ref 140–400)
RBC: 4.25 10*6/uL (ref 3.80–5.10)
RDW: 13.6 % (ref 11.0–15.0)
WBC: 9.4 10*3/uL (ref 3.8–10.8)

## 2021-08-03 LAB — LIPID PANEL
Cholesterol: 224 mg/dL — ABNORMAL HIGH (ref <200)
HDL: 64 mg/dL (ref >=50)
LDL Cholesterol (Calc): 142 mg/dL (calc) — ABNORMAL HIGH
Non-HDL Cholesterol (Calc): 160 mg/dL (calc) — ABNORMAL HIGH (ref <130)
Total CHOL/HDL Ratio: 3.5 (calc) (ref <5.0)
Triglycerides: 81 mg/dL (ref <150)

## 2021-08-03 LAB — T4, FREE: Free T4: 0.5 ng/dL — ABNORMAL LOW (ref 0.8–1.8)

## 2021-08-03 LAB — TSH: TSH: 55.52 mIU/L — ABNORMAL HIGH (ref 0.40–4.50)

## 2021-08-16 ENCOUNTER — Other Ambulatory Visit: Payer: Self-pay

## 2021-08-16 ENCOUNTER — Ambulatory Visit (INDEPENDENT_AMBULATORY_CARE_PROVIDER_SITE_OTHER): Payer: 59 | Admitting: Internal Medicine

## 2021-08-16 ENCOUNTER — Encounter: Payer: Self-pay | Admitting: Internal Medicine

## 2021-08-16 VITALS — BP 134/75 | HR 72 | Temp 98.1°F | Ht 64.0 in | Wt 187.0 lb

## 2021-08-16 DIAGNOSIS — I10 Essential (primary) hypertension: Secondary | ICD-10-CM | POA: Diagnosis not present

## 2021-08-16 DIAGNOSIS — E890 Postprocedural hypothyroidism: Secondary | ICD-10-CM

## 2021-08-16 DIAGNOSIS — J439 Emphysema, unspecified: Secondary | ICD-10-CM | POA: Diagnosis not present

## 2021-08-16 DIAGNOSIS — Z6832 Body mass index (BMI) 32.0-32.9, adult: Secondary | ICD-10-CM

## 2021-08-16 DIAGNOSIS — G43C1 Periodic headache syndromes in child or adult, intractable: Secondary | ICD-10-CM | POA: Diagnosis not present

## 2021-08-16 DIAGNOSIS — E6609 Other obesity due to excess calories: Secondary | ICD-10-CM

## 2021-08-16 MED ORDER — BUDESONIDE-FORMOTEROL FUMARATE 80-4.5 MCG/ACT IN AERO: 2.0000 | INHALATION_SPRAY | Freq: Two times a day (BID) | RESPIRATORY_TRACT | 2 refills | Status: DC

## 2021-08-16 MED ORDER — VARENICLINE TARTRATE 0.5 MG X 11 & 1 MG X 42 PO TBPK: ORAL_TABLET | ORAL | 0 refills | Status: DC

## 2021-08-16 MED ORDER — PROPRANOLOL HCL ER 60 MG PO CP24: 60.0000 mg | ORAL_CAPSULE | Freq: Every day | ORAL | 2 refills | Status: DC

## 2021-08-16 MED ORDER — LEVOTHYROXINE SODIUM 150 MCG PO TABS: 150.0000 ug | ORAL_TABLET | Freq: Every day | ORAL | 0 refills | Status: DC

## 2021-08-16 NOTE — Assessment & Plan Note (Signed)
Controlled on Lisinopril HCT 20-25, 2 tabs daily, refilled today Reinforced DASH diet and exercise for weight loss

## 2021-08-16 NOTE — Assessment & Plan Note (Signed)
She did not pick up Advair due to concern of prior adverse reaction to Fluticasone We will trial Symbicort 2 puffs twice daily Encourage smoking cessation, Chantix provided Discussed lung cancer screening, she declines at this time but will consider this

## 2021-08-16 NOTE — Assessment & Plan Note (Signed)
Encourage diet and exercise for weight loss 

## 2021-08-16 NOTE — Patient Instructions (Signed)
Managing the Challenge of Quitting Smoking ?Quitting smoking is a physical and mental challenge. You will face cravings, withdrawal symptoms, and temptation. Before quitting, work with your health care provider to make a plan that can help you manage quitting. Preparation can help you quit and keep you from giving in. ?How to manage lifestyle changes ?Managing stress ?Stress can make you want to smoke, and wanting to smoke may cause stress. It is important to find ways to manage your stress. You might try some of the following: ?Practice relaxation techniques. ?Breathe slowly and deeply, in through your nose and out through your mouth. ?Listen to music. ?Soak in a bath or take a shower. ?Imagine a peaceful place or vacation. ?Get some support. ?Talk with family or friends about your stress. ?Join a support group. ?Talk with a counselor or therapist. ?Get some physical activity. ?Go for a walk, run, or bike ride. ?Play a favorite sport. ?Practice yoga. ? ?Medicines ?Talk with your health care provider about medicines that might help you deal with cravings and make quitting easier for you. ?Relationships ?Social situations can be difficult when you are quitting smoking. To manage this, you can: ?Avoid parties and other social situations where people might be smoking. ?Avoid alcohol. ?Leave right away if you have the urge to smoke. ?Explain to your family and friends that you are quitting smoking. Ask for support and let them know you might be a bit grumpy. ?Plan activities where smoking is not an option. ?General instructions ?Be aware that many people gain weight after they quit smoking. However, not everyone does. To keep from gaining weight, have a plan in place before you quit and stick to the plan after you quit. Your plan should include: ?Having healthy snacks. When you have a craving, it may help to: ?Eat popcorn, carrots, celery, or other cut vegetables. ?Chew sugar-free gum. ?Changing how you eat. ?Eat small  portion sizes at meals. ?Eat 4-6 small meals throughout the day instead of 1-2 large meals a day. ?Be mindful when you eat. Do not watch television or do other things that might distract you as you eat. ?Exercising regularly. ?Make time to exercise each day. If you do not have time for a long workout, do short bouts of exercise for 5-10 minutes several times a day. ?Do some form of strengthening exercise, such as weight lifting. ?Do some exercise that gets your heart beating and causes you to breathe deeply, such as walking fast, running, swimming, or biking. This is very important. ?Drinking plenty of water or other low-calorie or no-calorie drinks. Drink 6-8 glasses of water daily. ? ?How to recognize withdrawal symptoms ?Your body and mind may experience discomfort as you try to get used to not having nicotine in your system. These effects are called withdrawal symptoms. They may include: ?Feeling hungrier than normal. ?Having trouble concentrating. ?Feeling irritable or restless. ?Having trouble sleeping. ?Feeling depressed. ?Craving a cigarette. ?To manage withdrawal symptoms: ?Avoid places, people, and activities that trigger your cravings. ?Remember why you want to quit. ?Get plenty of sleep. ?Avoid coffee and other caffeinated drinks. These may worsen some of your symptoms. ?These symptoms may surprise you. But be assured that they are normal to have when quitting smoking. ?How to manage cravings ?Come up with a plan for how to deal with your cravings. The plan should include the following: ?A definition of the specific situation you want to deal with. ?An alternative action you will take. ?A clear idea for how this action   will help. ?The name of someone who might help you with this. ?Cravings usually last for 5-10 minutes. Consider taking the following actions to help you with your plan to deal with cravings: ?Keep your mouth busy. ?Chew sugar-free gum. ?Suck on hard candies or a straw. ?Brush your  teeth. ?Keep your hands and body busy. ?Change to a different activity right away. ?Squeeze or play with a ball. ?Do an activity or a hobby, such as making bead jewelry, practicing needlepoint, or working with wood. ?Mix up your normal routine. ?Take a short exercise break. Go for a quick walk or run up and down stairs. ?Focus on doing something kind or helpful for someone else. ?Call a friend or family member to talk during a craving. ?Join a support group. ?Contact a quitline. ?Where to find support ?To get help or find a support group: ?Call the National Cancer Institute's Smoking Quitline: 1-800-QUIT NOW (784-8669) ?Visit the website of the Substance Abuse and Mental Health Services Administration: www.samhsa.gov ?Text QUIT to SmokefreeTXT: 478848 ?Where to find more information ?Visit these websites to find more information on quitting smoking: ?National Cancer Institute: www.smokefree.gov ?American Lung Association: www.lung.org ?American Cancer Society: www.cancer.org ?Centers for Disease Control and Prevention: www.cdc.gov ?American Heart Association: www.heart.org ?Contact a health care provider if: ?You want to change your plan for quitting. ?The medicines you are taking are not helping. ?Your eating feels out of control or you cannot sleep. ?Get help right away if: ?You feel depressed or become very anxious. ?Summary ?Quitting smoking is a physical and mental challenge. You will face cravings, withdrawal symptoms, and temptation to smoke again. Preparation can help you as you go through these challenges. ?Try different techniques to manage stress, handle social situations, and prevent weight gain. ?You can deal with cravings by keeping your mouth busy (such as by chewing gum), keeping your hands and body busy, calling family or friends, or contacting a quitline for people who want to quit smoking. ?You can deal with withdrawal symptoms by avoiding places where people smoke, getting plenty of rest, and  avoiding drinks with caffeine. ?This information is not intended to replace advice given to you by your health care provider. Make sure you discuss any questions you have with your health care provider. ?Document Revised: 03/18/2021 Document Reviewed: 04/29/2019 ?Elsevier Patient Education ? 2022 [employer] ? ?

## 2021-08-16 NOTE — Assessment & Plan Note (Signed)
No improvement with improved blood pressure control We will trial Propanolol 60 mg daily Try to avoid BC powders if possible

## 2021-08-16 NOTE — Progress Notes (Signed)
Subjective:    Patient ID: Gina Wells, female    DOB: 1960/10/15, 61 y.o.   MRN: [id number]  HPI  Patient presents the clinic today for 2-week follow-up of HTN: At her last visit, her Lisinopril HCT was increased to 20-25 mg, 2 tabs daily.  She has been taking the medication as prescribed.  Her BP today is 134/75.  ECG from 11/2020 reviewed.  She also reports persistent headaches. She reports this occurs daily. The headaches are located above her right eye. She describes the pain as constant ache. She denies dizziness, vision changes, sensitivity to light or sound, nasal congestion, runny nose, ear pain or chronic neck pain.  She reports she is sleeping well and has no issues with mood at this time.  She takes BC's daily which used to help but now does not seem as effective.  She knows that she is taking more than she should.  She has never seen neurology in the past for evaluation of these headaches.  She had a normal CT and MRI of the brain 04/2018.  She also reports that she did not pick up the Advair.  She asked the pharmacist and was advised that it did have Fluticasone in it.  She reports the last time she took fluticasone nasally, she developed a migraine for 10 days.  She does report chronic cough and shortness of breath. She is currently smoking 1/2 PPD for 45 years. She has tried to vape to cut out cigarettes, but it made her throat hurt and she coughed more. She has tried Chantix in the past which seemed to lose effectiveness.  She has not had success with weaning down on cigarettes.  She is interested in smoking cessation at this time.  She reports she needs a refill of her levothyroxine.  Her last TSH was 55, 07/2021.  Her Levothyroxine was increased to 150 mcgs daily.  She never returned our call about her labs and so is still currently taking 125 mcgs daily.  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 18 g 0   Aspirin-Salicylamide-Caffeine (BC HEADACHE POWDER PO) Take by mouth 3 (three) times daily as needed.     fluticasone-salmeterol (ADVAIR) 100-50 MCG/ACT AEPB Inhale 1 puff into the lungs 2 (two) times daily. 1 each 3   levothyroxine (SYNTHROID) 125 MCG tablet Take 1 tablet by mouth once daily 90 tablet 0   lisinopril-hydrochlorothiazide (ZESTORETIC) 20-25 MG tablet Take 2 tablets by mouth daily. 180 tablet 0   omeprazole (PRILOSEC) 20 MG capsule Take 1 capsule (20 mg total) by mouth 2 (two) times daily before a meal. (Patient taking differently: Take 20 mg by mouth as needed.) 180 capsule 1   simethicone (MYLICON) 0000000 MG chewable tablet Chew 125 mg by mouth every 6 (six) hours as needed for flatulence.     No current facility-administered medications for this visit.    No Known Allergies  Family History  Problem Relation  Age of Onset   Diabetes Mother    Alcohol abuse Father    Diabetes Father    Cancer Father    Cancer Paternal Aunt    Alcohol abuse Brother     Social History   Socioeconomic History   Marital status: Single    Spouse name: Not on file   Number of children: Not on file   Years of education: Not on file   Highest education level: Not on file  Occupational History   Not on file  Tobacco Use   Smoking status: Every Day    Packs/day: 0.25    Years: 50.00    Pack years: 12.50    Types: Cigarettes    Start date: 07/25/1975   Smokeless tobacco: Never  Vaping Use   Vaping Use: Never used  Substance and Sexual Activity   Alcohol use: No    Alcohol/week: 0.0 standard  drinks   Drug use: No   Sexual activity: Yes  Other Topics Concern   Not on file  Social History Narrative   Not on file   Social Determinants of Health   Financial Resource Strain: Not on file  Food Insecurity: Not on file  Transportation Needs: Not on file  Physical Activity: Not on file  Stress: Not on file  Social Connections: Not on file  Intimate Partner Violence: Not on file     Constitutional: Patient reports chronic daily headache.  Denies fever, malaise, fatigue, or abrupt weight changes.  HEENT: Denies eye pain, eye redness, ear pain, ringing in the ears, wax buildup, runny nose, nasal congestion, bloody nose, or sore throat. Respiratory: Patient reports chronic cough and shortness of breath.  Denies difficulty breathing.   Cardiovascular: Denies chest pain, chest tightness, palpitations or swelling in the hands or feet.  Musculoskeletal: Denies decrease in range of motion, difficulty with gait, muscle pain or joint pain and swelling.  Skin: Denies redness, rashes, lesions or ulcercations.  Neurological: Denies dizziness, difficulty with memory, difficulty with speech or problems with balance and coordination.  Psych: Denies anxiety, depression, SI/HI.  No other specific complaints in a complete review of systems (except as listed in HPI above).  Objective:   Physical Exam  BP 134/75    Pulse 72    Temp 98.1 F (36.7 C) (Oral)    Ht 5\' 4"  (1.626 m)    Wt 187 lb (84.8 kg)    SpO2 100%    BMI 32.10 kg/m   Wt Readings from Last 3 Encounters:  08/02/21 188 lb (85.3 kg)  06/18/21 183 lb (83 kg)  01/05/21 189 lb 3.2 oz (85.8 kg)    General: Appears her stated age, obese, in NAD. Skin: Warm, dry and intact.  HEENT: Head: normal shape and size; Eyes: EOMs intact;  Neck:  Neck supple, trachea midline. No masses, lumps present.  Cardiovascular: Normal rate. Pulmonary/Chest: Normal effort and positive vesicular breath sounds. No respiratory distress.   Musculoskeletal: Normal range of motion of the cervical spine.  No difficulty with gait.  Neurological: Alert and oriented. Coordination normal.  Psychiatric: Mood and affect normal. Behavior is normal. Judgment and thought content normal.     BMET    Component Value Date/Time   NA 143 08/02/2021 1112   NA 140 03/08/2017 1909   K 4.2 08/02/2021 1112   CL 106 08/02/2021 1112   CO2 27 08/02/2021 1112   GLUCOSE 95 08/02/2021 1112   BUN 22 08/02/2021 1112   BUN 10 03/08/2017 1909  CREATININE 0.93 08/02/2021 1112   CALCIUM 9.8 08/02/2021 1112   GFRNONAA 43 (L) 12/21/2020 1732   GFRNONAA 59 (L) 02/02/2020 1142   GFRAA 69 02/02/2020 1142    Lipid Panel     Component Value Date/Time   CHOL 224 (H) 08/02/2021 1112   CHOL 233 (H) 03/08/2017 1909   TRIG 81 08/02/2021 1112   HDL 64 08/02/2021 1112   HDL 62 03/08/2017 1909   CHOLHDL 3.5 08/02/2021 1112   VLDL 9 05/19/2018 1549   LDLCALC 142 (H) 08/02/2021 1112    CBC    Component Value Date/Time   WBC 9.4 08/02/2021 1112   RBC 4.25 08/02/2021 1112   HGB 12.9 08/02/2021 1112   HGB 13.0 03/08/2017 1909   HCT 39.4 08/02/2021 1112   HCT 39.8 03/08/2017 1909   PLT 284 08/02/2021 1112   PLT 271 03/08/2017 1909   MCV 92.7 08/02/2021 1112   MCV 89 03/08/2017 1909   MCH 30.4 08/02/2021 1112   MCHC 32.7 08/02/2021 1112   RDW 13.6 08/02/2021 1112   RDW 14.4 03/08/2017 1909   LYMPHSABS 2,897 10/08/2019 0958   LYMPHSABS 3.2 (H) 01/19/2015 0808   MONOABS 0.7 05/19/2018 1116   EOSABS 61 10/08/2019 0958   EOSABS 0.1 01/19/2015 0808   BASOSABS 61 10/08/2019 0958   BASOSABS 0.0 01/19/2015 0808    Hgb A1C Lab Results  Component Value Date   HGBA1C 5.9 (H) 08/02/2021            Assessment & Plan:     Webb Silversmith, NP This visit occurred during the SARS-CoV-2 public health emergency.  Safety protocols were in place, including screening questions prior to the visit, additional usage of staff PPE, and extensive cleaning  of exam room while observing appropriate contact time as indicated for disinfecting solutions.

## 2021-08-16 NOTE — Assessment & Plan Note (Signed)
We will send in Levothyroxine 150 MCG's daily Schedule a lab appointment in 3 weeks for repeat TSH and free T4

## 2021-09-05 ENCOUNTER — Other Ambulatory Visit: Payer: Self-pay

## 2021-09-05 DIAGNOSIS — E890 Postprocedural hypothyroidism: Secondary | ICD-10-CM

## 2021-09-06 ENCOUNTER — Other Ambulatory Visit: Payer: 59

## 2021-09-11 IMAGING — CT CT ABD-PELV W/O CM
2 of 4 series · 16 of 46 positions shown, 18 images · non-contrast
Comparison: 10/14/2020 CT abdomen/pelvis.

CLINICAL DATA: Decreased appetite.  Bowel obstruction suspected.

EXAM:
CT ABDOMEN AND PELVIS WITHOUT CONTRAST
TECHNIQUE: Multidetector CT imaging of the abdomen and pelvis was performed
following the standard protocol without IV contrast.

[Series 2: axial st · axial · 0.86mm/px · z∈[-1075,-660]mm · 13 of 91 slices shown, 15 images]
[im 4/91  soft-tissue]
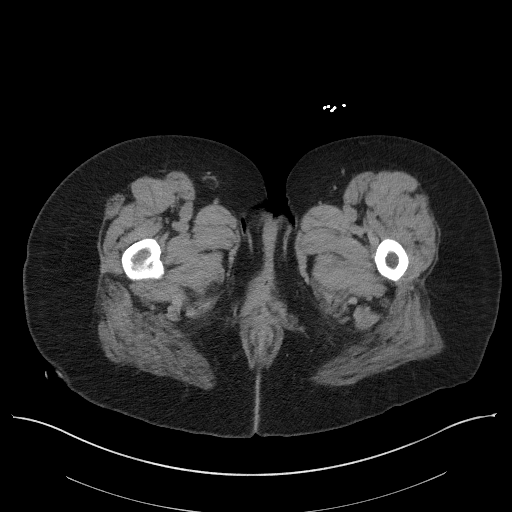
[im 4/91  bone]
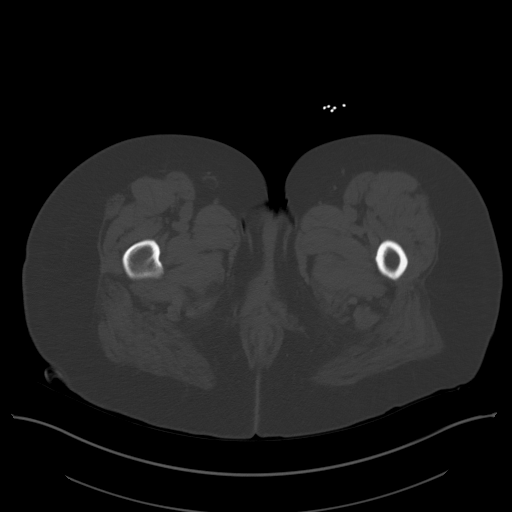
[im 11/91  soft-tissue]
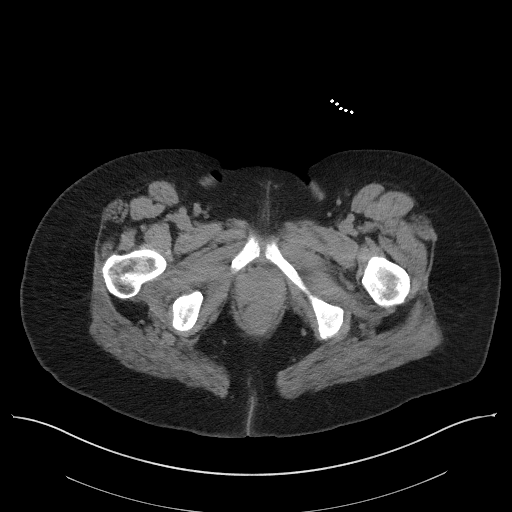
[im 19/91  soft-tissue]
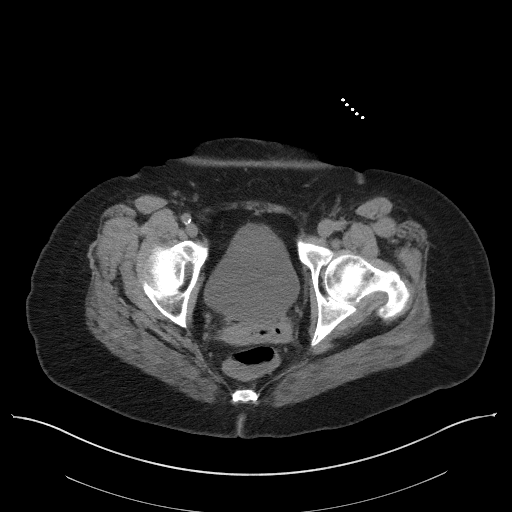
[im 26/91  soft-tissue]
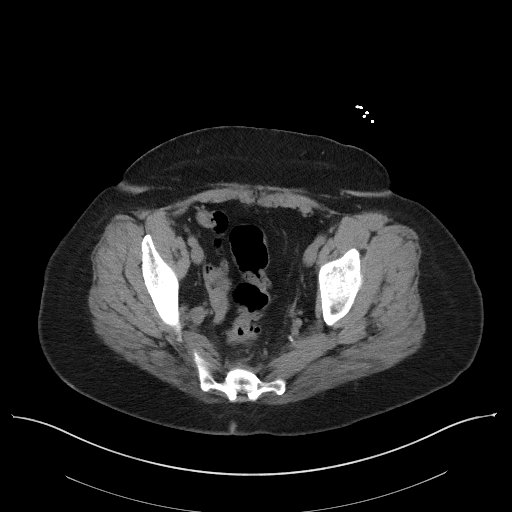
[im 33/91  soft-tissue]
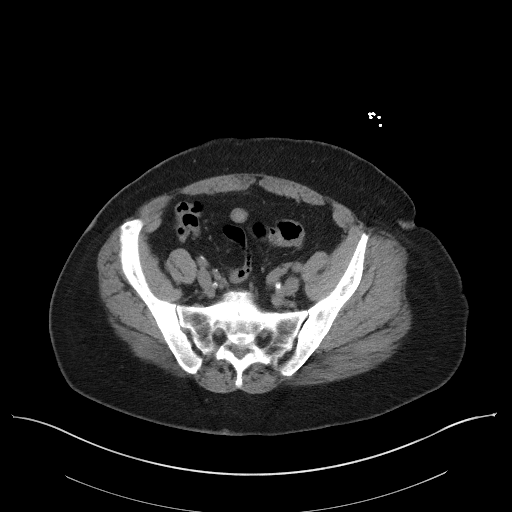
[im 40/91  soft-tissue]
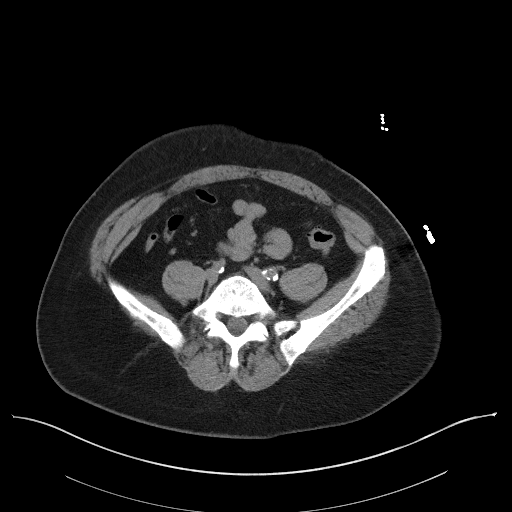
[im 47/91  soft-tissue]
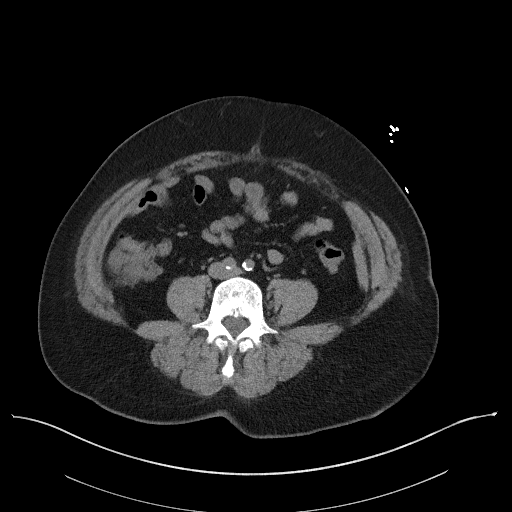
[im 51/91  soft-tissue]
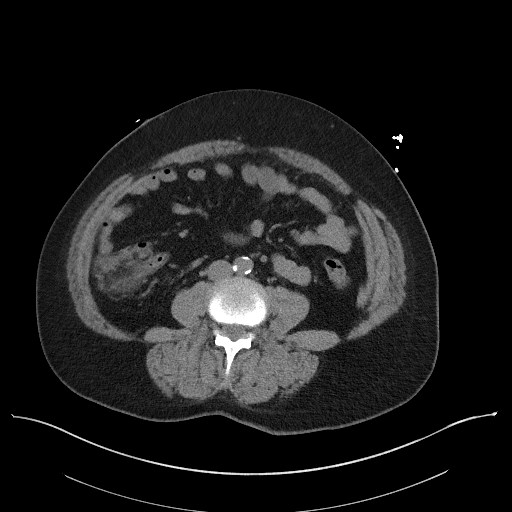
[im 58/91  soft-tissue]
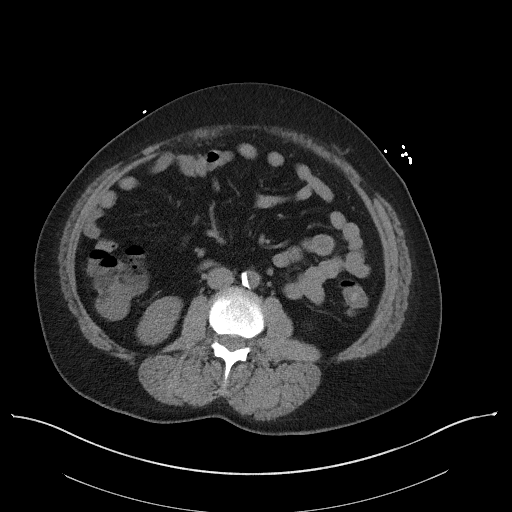
[im 58/91  bone]
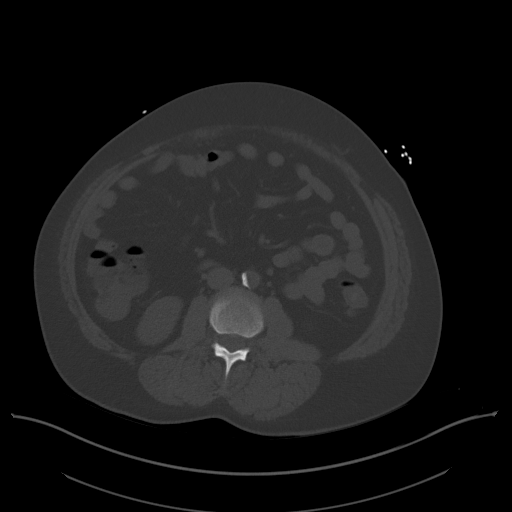
[im 65/91  soft-tissue]
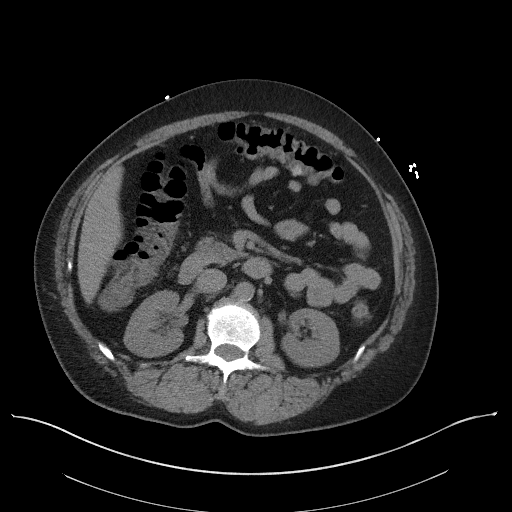
[im 73/91  soft-tissue]
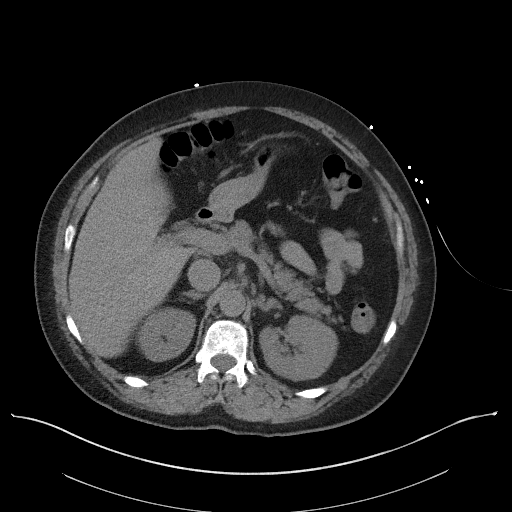
[im 80/91  soft-tissue]
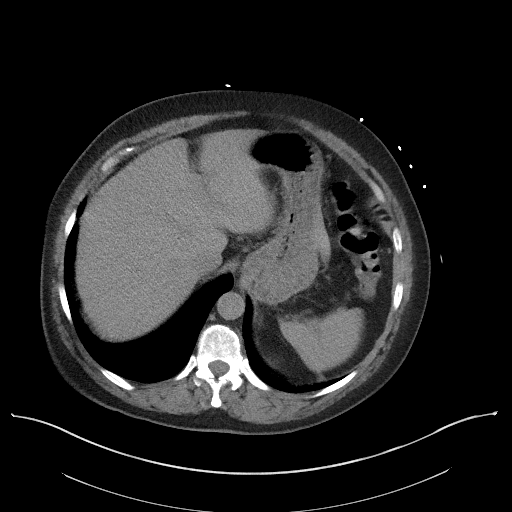
[im 87/91  soft-tissue]
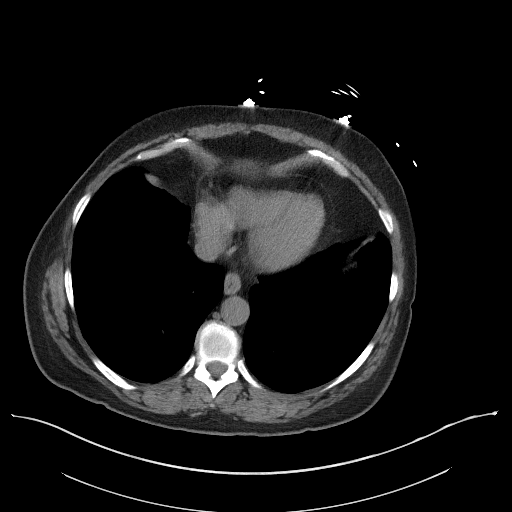

[Series 5: coronal st · coronal · 0.81mm/px · 3 of 97 slices shown]
[im 33/97  soft-tissue]
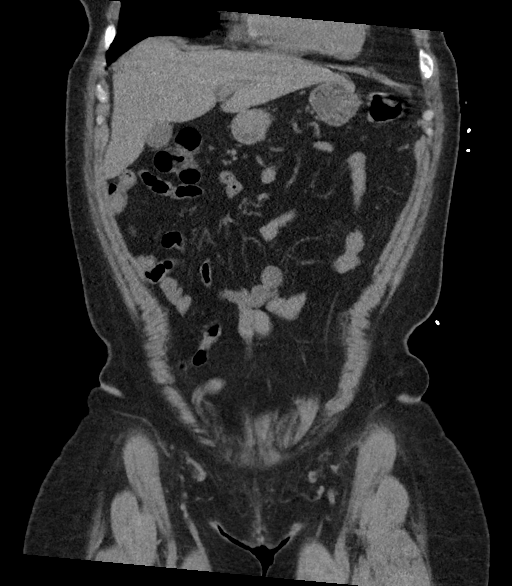
[im 43/97  soft-tissue]
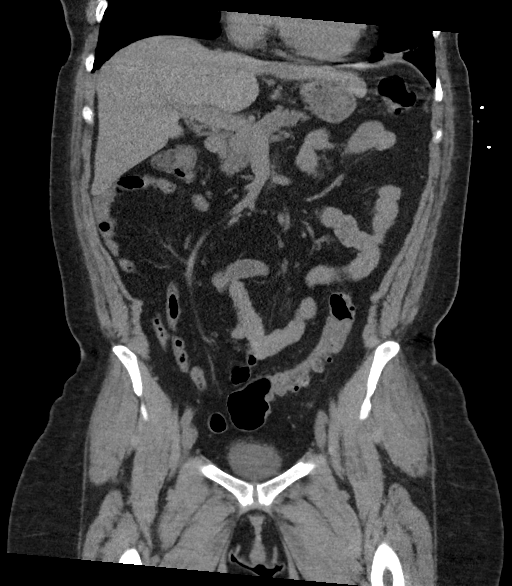
[im 54/97  soft-tissue]
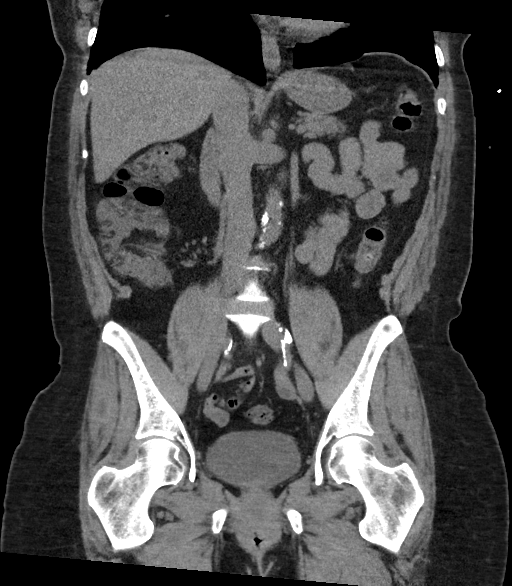

[16 of 46 positions shown; findings below may reference images not displayed]

FINDINGS: Lower chest: No significant pulmonary nodules or acute consolidative
airspace disease.

Hepatobiliary: Normal liver size. No liver mass. Normal gallbladder
with no radiopaque cholelithiasis. No biliary ductal dilatation.

Pancreas: Normal, with no mass or duct dilation.

Spleen: Normal size. No mass.

Adrenals/Urinary Tract: Normal adrenals. No renal stones. No
hydronephrosis. No contour deforming renal masses. Normal bladder.

Stomach/Bowel: Normal non-distended stomach. Normal caliber small
bowel with no small bowel wall thickening. Normal appendix.
Scattered mild left colonic diverticulosis with no large bowel wall
thickening or significant pericolonic fat stranding.

Vascular/Lymphatic: Atherosclerotic nonaneurysmal abdominal aorta.
No pathologically enlarged lymph nodes in the abdomen or pelvis.

Reproductive: Status post hysterectomy, with no abnormal findings at
the vaginal cuff. No adnexal mass.

Other: No pneumoperitoneum, ascites or focal fluid collection.

Musculoskeletal: No aggressive appearing focal osseous lesions.
Minimal thoracolumbar spondylosis.
IMPRESSION: 1. No acute abnormality. No evidence of bowel obstruction or acute
bowel inflammation. Mild left colonic diverticulosis, with no
evidence of acute diverticulitis.
2. Aortic Atherosclerosis (O09BF-YAA.A).

## 2021-11-14 ENCOUNTER — Ambulatory Visit: Payer: 59 | Admitting: Internal Medicine

## 2021-11-14 NOTE — Progress Notes (Deleted)
Subjective:    Patient ID: Gina Wells, female    DOB: Jun 20, 1961, 61 y.o.   MRN: [id number]  HPI  Patient presents to clinic today for her annual exam.  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 18 g 0   Aspirin-Salicylamide-Caffeine (BC HEADACHE POWDER PO) Take by mouth 3 (three) times daily as needed.     budesonide-formoterol (SYMBICORT) 80-4.5 MCG/ACT inhaler Inhale 2 puffs into the lungs 2 (two) times daily. 1 each 2   levothyroxine (SYNTHROID) 150 MCG tablet Take 1 tablet (150 mcg total) by mouth daily. 30 tablet 0   lisinopril-hydrochlorothiazide (ZESTORETIC) 20-25 MG tablet Take 2 tablets by mouth daily. 180 tablet 0   omeprazole (PRILOSEC) 20 MG capsule Take 1 capsule (20 mg total) by mouth 2 (two) times daily before a meal. (Patient taking differently: Take 20 mg by mouth as needed.) 180 capsule 1   propranolol ER (INDERAL LA) 60 MG 24 hr capsule Take 1 capsule (60 mg total) by mouth daily. 30 capsule 2   simethicone (MYLICON) 970 MG chewable tablet Chew 125 mg by mouth every 6 (six) hours as needed for flatulence.     varenicline (CHANTIX PAK) 0.5 MG X 11 & 1 MG X 42 tablet Take one 0.5 mg tab by mouth once daily for 3 days, increase to one 0.5 mg twice daily for 4 days, then increase to one 1 mg twice daily. 53 tablet 0   No current  facility-administered medications for this visit.    No Known Allergies  Family History  Problem Relation Age of Onset   Diabetes Mother    Alcohol abuse Father    Diabetes Father    Cancer Father    Cancer Paternal Aunt    Alcohol abuse Brother     Social History   Socioeconomic History   Marital status: Single    Spouse name: Not on file   Number of children: Not on file   Years of education: Not on file   Highest education level: Not on file  Occupational History   Not on file  Tobacco Use   Smoking status: Every Day    Packs/day: 0.25    Years: 50.00    Pack years: 12.50    Types: Cigarettes    Start date: 07/25/1975   Smokeless tobacco: Never  Vaping Use   Vaping Use: Never used  Substance and Sexual Activity   Alcohol use: No    Alcohol/week: 0.0 standard drinks   Drug use: No   Sexual activity: Yes  Other Topics Concern   Not on file  Social History Narrative   Not on file   Social Determinants of Health   Financial Resource Strain: Not on file  Food Insecurity: Not on file  Transportation Needs: Not on file  Physical Activity: Not on file  Stress: Not on file  Social Connections: Not on file  Intimate Partner Violence: Not on file     Constitutional: Patient reports intermittent headaches.  Denies fever, malaise, fatigue, or abrupt weight changes.  HEENT: Denies eye pain, eye redness, ear pain, ringing in the ears, wax buildup, runny nose, nasal congestion, bloody nose, or sore throat. Respiratory: Denies difficulty breathing, shortness of breath, cough or sputum production.   Cardiovascular: Denies chest pain, chest tightness, palpitations or swelling in the hands or feet.  Gastrointestinal: Denies abdominal pain, bloating, constipation, diarrhea or blood in the stool.  GU: Denies urgency, frequency, pain with urination, burning sensation, blood in urine, odor or discharge. Musculoskeletal: Denies decrease in range of motion, difficulty with gait,  muscle pain or joint pain and swelling.  Skin: Denies redness, rashes, lesions or ulcercations.  Neurological: Denies dizziness, difficulty with memory, difficulty with speech or problems with balance and coordination.  Psych: Patient has a history of anxiety and depression.  Denies SI/HI.  No other specific complaints in a complete review of systems (except as listed in HPI above).  Objective:   Physical Exam  There were no vitals taken for this visit. Wt Readings from Last 3 Encounters:  08/16/21 187 lb (84.8 kg)  08/02/21 188 lb (85.3 kg)  06/18/21 183 lb (83 kg)    General: Appears their stated age, well developed, well nourished in NAD. Skin: Warm, dry and intact. No rashes, lesions or ulcerations noted. HEENT: Head: normal shape and size; Eyes: sclera white, no icterus, conjunctiva pink, PERRLA and EOMs intact; Ears: Tm's gray and intact, normal light reflex; Nose: mucosa pink and moist, septum midline; Throat/Mouth: Teeth present, mucosa pink and moist, no exudate, lesions or ulcerations noted.  Neck:  Neck supple, trachea midline. No masses, lumps or thyromegaly present.  Cardiovascular: Normal rate and rhythm. S1,S2 noted.  No murmur, rubs or gallops noted. No JVD or BLE edema. No carotid bruits noted. Pulmonary/Chest: Normal effort and positive vesicular breath sounds. No respiratory distress. No wheezes, rales or ronchi noted.  Abdomen: Soft and nontender. Normal bowel sounds. No distention or masses noted. Liver, spleen and kidneys non palpable. Musculoskeletal: Normal range of motion. No signs of joint swelling. No difficulty with gait.  Neurological: Alert and oriented. Cranial nerves II-XII grossly intact. Coordination normal.  Psychiatric: Mood and affect normal. Behavior is normal. Judgment and thought content normal.    BMET    Component Value Date/Time   NA 143 08/02/2021 1112   NA 140 03/08/2017 1909   K 4.2 08/02/2021 1112   CL 106 08/02/2021 1112   CO2 27  08/02/2021 1112   GLUCOSE 95 08/02/2021 1112   BUN 22 08/02/2021 1112   BUN 10 03/08/2017 1909   CREATININE 0.93 08/02/2021 1112   CALCIUM 9.8 08/02/2021 1112   GFRNONAA 43 (L) 12/21/2020 1732   GFRNONAA 59 (L) 02/02/2020 1142   GFRAA 69 02/02/2020 1142    Lipid Panel     Component Value Date/Time   CHOL 224 (H) 08/02/2021 1112   CHOL 233 (H) 03/08/2017 1909   TRIG 81 08/02/2021 1112   HDL 64 08/02/2021 1112   HDL 62 03/08/2017 1909   CHOLHDL 3.5 08/02/2021 1112   VLDL 9 05/19/2018 1549   LDLCALC 142 (H) 08/02/2021 1112    CBC    Component Value Date/Time   WBC 9.4 08/02/2021 1112   RBC 4.25 08/02/2021 1112   HGB 12.9 08/02/2021 1112   HGB 13.0 03/08/2017 1909   HCT  39.4 08/02/2021 1112   HCT 39.8 03/08/2017 1909   PLT 284 08/02/2021 1112   PLT 271 03/08/2017 1909   MCV 92.7 08/02/2021 1112   MCV 89 03/08/2017 1909   MCH 30.4 08/02/2021 1112   MCHC 32.7 08/02/2021 1112   RDW 13.6 08/02/2021 1112   RDW 14.4 03/08/2017 1909   LYMPHSABS 2,897 10/08/2019 0958   LYMPHSABS 3.2 (H) 01/19/2015 0808   MONOABS 0.7 05/19/2018 1116   EOSABS 61 10/08/2019 0958   EOSABS 0.1 01/19/2015 0808   BASOSABS 61 10/08/2019 0958   BASOSABS 0.0 01/19/2015 0808    Hgb A1C Lab Results  Component Value Date   HGBA1C 5.9 (H) 08/02/2021           Assessment & Plan:   Preventative Health Maintenance:  She no longer needs Pap smears Encouraged her to consume a balanced diet and exercise regimen Advised her to see an eye doctor and dentist annually We will check CBC, c-Met, lipid, free T4, TSH, A1c and urine microalbumin today  RTC in 6 months, follow-up chronic conditions Webb Silversmith, NP

## 2021-12-01 ENCOUNTER — Other Ambulatory Visit: Payer: Self-pay | Admitting: Internal Medicine

## 2021-12-01 NOTE — Telephone Encounter (Signed)
Requested medication (s) are due for refill today: yes ? ?Requested medication (s) are on the active medication list: yes   ? ?Last refill: 08/16/21  #30  0 refills ? ?Future visit scheduled No ? ?Notes to clinic: Attempted to reach pt at number provided, answered, states "No one here by that name, wrong number. Verified number, correct ,  as noted in demographics. No show for appt 11/14/21 ? ?Requested Prescriptions  ?Pending Prescriptions Disp Refills  ? levothyroxine (SYNTHROID) 150 MCG tablet [Pharmacy Med Name: Levothyroxine Sodium 150 MCG Oral Tablet] 30 tablet 0  ?  Sig: Take 1 tablet by mouth once daily  ?  ? Endocrinology:  Hypothyroid Agents Failed - 12/01/2021  2:42 PM  ?  ?  Failed - TSH in normal range and within 360 days  ?  TSH  ?Date Value Ref Range Status  ?08/02/2021 55.52 (H) 0.40 - 4.50 mIU/L Final  ?   ?  ?  Passed - Valid encounter within last 12 months  ?  Recent Outpatient Visits   ? ?      ? 3 months ago Postablative hypothyroidism  ? [facility] [place], [place] W, NP  ? 4 months ago Essential hypertension  ? [facility] [place], [place], NP  ? 11 months ago AKI (acute kidney injury) [facility])  ? [facility] [place], Coralie Keens, NP  ? 1 year ago Shortness of breath  ? [place], FNP  ? 1 year ago Essential hypertension  ? [facility], Lupita Raider, FNP  ? ?  ?  ? ? ?  ?  ?  ? ? ? ? ?

## 2021-12-14 ENCOUNTER — Ambulatory Visit (INDEPENDENT_AMBULATORY_CARE_PROVIDER_SITE_OTHER): Payer: 59 | Admitting: Internal Medicine

## 2021-12-14 ENCOUNTER — Encounter: Payer: Self-pay | Admitting: Internal Medicine

## 2021-12-14 VITALS — BP 134/86 | HR 59 | Temp 96.6°F | Wt 189.0 lb

## 2021-12-14 DIAGNOSIS — Z1231 Encounter for screening mammogram for malignant neoplasm of breast: Secondary | ICD-10-CM | POA: Diagnosis not present

## 2021-12-14 DIAGNOSIS — I700 Atherosclerosis of aorta: Secondary | ICD-10-CM

## 2021-12-14 DIAGNOSIS — E6609 Other obesity due to excess calories: Secondary | ICD-10-CM | POA: Diagnosis not present

## 2021-12-14 DIAGNOSIS — F419 Anxiety disorder, unspecified: Secondary | ICD-10-CM

## 2021-12-14 DIAGNOSIS — E890 Postprocedural hypothyroidism: Secondary | ICD-10-CM

## 2021-12-14 DIAGNOSIS — Z6832 Body mass index (BMI) 32.0-32.9, adult: Secondary | ICD-10-CM

## 2021-12-14 DIAGNOSIS — R7303 Prediabetes: Secondary | ICD-10-CM

## 2021-12-14 DIAGNOSIS — K219 Gastro-esophageal reflux disease without esophagitis: Secondary | ICD-10-CM

## 2021-12-14 DIAGNOSIS — E66811 Other obesity due to excess calories: Secondary | ICD-10-CM

## 2021-12-14 DIAGNOSIS — F32A Depression, unspecified: Secondary | ICD-10-CM

## 2021-12-14 DIAGNOSIS — J439 Emphysema, unspecified: Secondary | ICD-10-CM

## 2021-12-14 DIAGNOSIS — I10 Essential (primary) hypertension: Secondary | ICD-10-CM

## 2021-12-14 DIAGNOSIS — G43C1 Periodic headache syndromes in child or adult, intractable: Secondary | ICD-10-CM

## 2021-12-14 DIAGNOSIS — E7800 Pure hypercholesterolemia, unspecified: Secondary | ICD-10-CM

## 2021-12-14 MED ORDER — VENLAFAXINE HCL ER 37.5 MG PO CP24: 37.5000 mg | ORAL_CAPSULE | Freq: Every day | ORAL | 0 refills | Status: DC

## 2021-12-14 MED ORDER — BREZTRI AEROSPHERE 160-9-4.8 MCG/ACT IN AERO: 2.0000 | INHALATION_SPRAY | Freq: Two times a day (BID) | RESPIRATORY_TRACT | 5 refills | Status: DC

## 2021-12-14 MED ORDER — PROPRANOLOL HCL ER 60 MG PO CP24: 60.0000 mg | ORAL_CAPSULE | Freq: Every day | ORAL | 1 refills | Status: DC

## 2021-12-14 MED ORDER — LISINOPRIL-HYDROCHLOROTHIAZIDE 20-25 MG PO TABS: 2.0000 | ORAL_TABLET | Freq: Every day | ORAL | 1 refills | Status: DC

## 2021-12-14 MED ORDER — ASPIRIN 81 MG PO TBEC: 81.0000 mg | DELAYED_RELEASE_TABLET | Freq: Every day | ORAL | 12 refills | Status: DC

## 2021-12-14 NOTE — Assessment & Plan Note (Signed)
Deteriorated We will trial Venlafaxine 37.5 mg daily Support offered

## 2021-12-14 NOTE — Assessment & Plan Note (Signed)
C-Met and lipid profile today Encouraged her to consume a low-fat diet She had previously refused statin therapy but will reconsider if LDL >100

## 2021-12-14 NOTE — Assessment & Plan Note (Signed)
Encourage diet and exercise for weight loss 

## 2021-12-14 NOTE — Patient Instructions (Signed)
Heart-Healthy Eating Plan Heart-healthy meal planning includes: Eating less unhealthy fats. Eating more healthy fats. Making other changes in your diet. Talk with your doctor or a diet specialist (dietitian) to create an eating plan that is right for you. What is my plan? Your doctor may recommend an eating plan that includes: Total fat: ______% or less of total calories a day. Saturated fat: ______% or less of total calories a day. Cholesterol: less than _________mg a day. What are tips for following this plan? Cooking Avoid frying your food. Try to bake, boil, grill, or broil it instead. You can also reduce fat by: Removing the skin from poultry. Removing all visible fats from meats. Steaming vegetables in water or broth. Meal planning  At meals, divide your plate into four equal parts: Fill one-half of your plate with vegetables and green salads. Fill one-fourth of your plate with whole grains. Fill one-fourth of your plate with lean protein foods. Eat 4-5 servings of vegetables per day. A serving of vegetables is: 1 cup of raw or cooked vegetables. 2 cups of raw leafy greens. Eat 4-5 servings of fruit per day. A serving of fruit is: 1 medium whole fruit.  cup of dried fruit.  cup of fresh, frozen, or canned fruit.  cup of 100% fruit juice. Eat more foods that have soluble fiber. These are apples, broccoli, carrots, beans, peas, and barley. Try to get 20-30 g of fiber per day. Eat 4-5 servings of nuts, legumes, and seeds per week: 1 serving of dried beans or legumes equals  cup after being cooked. 1 serving of nuts is  cup. 1 serving of seeds equals 1 tablespoon. General information Eat more home-cooked food. Eat less restaurant, buffet, and fast food. Limit or avoid alcohol. Limit foods that are high in starch and sugar. Avoid fried foods. Lose weight if you are overweight. Keep track of how much salt (sodium) you eat. This is important if you have high blood  pressure. Ask your doctor to tell you more about this. Try to add vegetarian meals each week. Fats Choose healthy fats. These include olive oil and canola oil, flaxseeds, walnuts, almonds, and seeds. Eat more omega-3 fats. These include salmon, mackerel, sardines, tuna, flaxseed oil, and ground flaxseeds. Try to eat fish at least 2 times each week. Check food labels. Avoid foods with trans fats or high amounts of saturated fat. Limit saturated fats. These are often found in animal products, such as meats, butter, and cream. These are also found in plant foods, such as palm oil, palm kernel oil, and coconut oil. Avoid foods with partially hydrogenated oils in them. These have trans fats. Examples are stick margarine, some tub margarines, cookies, crackers, and other baked goods. What foods can I eat? Fruits All fresh, canned (in natural juice), or frozen fruits. Vegetables Fresh or frozen vegetables (raw, steamed, roasted, or grilled). Green salads. Grains Most grains. Choose whole wheat and whole grains most of the time. Rice and pasta, including brown rice and pastas made with whole wheat. Meats and other proteins Lean, well-trimmed beef, veal, pork, and lamb. Chicken and turkey without skin. All fish and shellfish. Wild duck, rabbit, pheasant, and venison. Egg whites or low-cholesterol egg substitutes. Dried beans, peas, lentils, and tofu. Seeds and most nuts. Dairy Low-fat or nonfat cheeses, including ricotta and mozzarella. Skim or 1% milk that is liquid, powdered, or evaporated. Buttermilk that is made with low-fat milk. Nonfat or low-fat yogurt. Fats and oils Non-hydrogenated (trans-free) margarines. Vegetable oils, including   soybean, sesame, sunflower, olive, peanut, safflower, corn, canola, and cottonseed. Salad dressings or mayonnaise made with a vegetable oil. Beverages Mineral water. Coffee and tea. Diet carbonated beverages. Sweets and desserts Sherbet, gelatin, and fruit ice.  Small amounts of dark chocolate. Limit all sweets and desserts. Seasonings and condiments All seasonings and condiments. The items listed above may not be a complete list of foods and drinks you can eat. Contact a dietitian for more options. What foods should I avoid? Fruits Canned fruit in heavy syrup. Fruit in cream or butter sauce. Fried fruit. Limit coconut. Vegetables Vegetables cooked in cheese, cream, or butter sauce. Fried vegetables. Grains Breads that are made with saturated or trans fats, oils, or whole milk. Croissants. Sweet rolls. Donuts. High-fat crackers, such as cheese crackers. Meats and other proteins Fatty meats, such as hot dogs, ribs, sausage, bacon, rib-eye roast or steak. High-fat deli meats, such as salami and bologna. Caviar. Domestic duck and goose. Organ meats, such as liver. Dairy Cream, sour cream, cream cheese, and creamed cottage cheese. Whole-milk cheeses. Whole or 2% milk that is liquid, evaporated, or condensed. Whole buttermilk. Cream sauce or high-fat cheese sauce. Yogurt that is made from whole milk. Fats and oils Meat fat, or shortening. Cocoa butter, hydrogenated oils, palm oil, coconut oil, palm kernel oil. Solid fats and shortenings, including bacon fat, salt pork, lard, and butter. Nondairy cream substitutes. Salad dressings with cheese or sour cream. Beverages Regular sodas and juice drinks with added sugar. Sweets and desserts Frosting. Pudding. Cookies. Cakes. Pies. Milk chocolate or white chocolate. Buttered syrups. Full-fat ice cream or ice cream drinks. The items listed above may not be a complete list of foods and drinks to avoid. Contact a dietitian for more information. Summary Heart-healthy meal planning includes eating less unhealthy fats, eating more healthy fats, and making other changes in your diet. Eat a balanced diet. This includes fruits and vegetables, low-fat or nonfat dairy, lean protein, nuts and legumes, whole grains, and  heart-healthy oils and fats. This information is not intended to replace advice given to you by your health care provider. Make sure you discuss any questions you have with your health care provider. Document Revised: 11/18/2020 Document Reviewed: 11/18/2020 Elsevier Patient Education  2022 [employer]  

## 2021-12-14 NOTE — Assessment & Plan Note (Signed)
Improved on Propanolol, refilled today Try to identify triggers and avoid

## 2021-12-14 NOTE — Assessment & Plan Note (Signed)
Encourage weight loss as this can help reduce reflux symptoms Avoid foods that trigger the reflux Continue Omeprazole

## 2021-12-14 NOTE — Assessment & Plan Note (Signed)
Persistent with no improvement with Symbicort, will D/C We will trial Breztri, sample provided She was not successful with smoking cessation with Chantix Referral to pulmonology for PFT testing and further evaluation/management

## 2021-12-14 NOTE — Assessment & Plan Note (Signed)
Has been taking 125 mcg instead of 150 mcg TSH and free T4 today We will adjust Levothyroxine if needed based on labs

## 2021-12-14 NOTE — Progress Notes (Signed)
Subjective:    Patient ID: Gina Wells, female    DOB: [birth year], 61 y.o.   MRN: [id number]  HPI  Patient presents to clinic today for follow-up of chronic conditions.  HTN: Her BP today is 134/86.  She is Lisinopril HCT and Propranolol taking as prescribed.  ECG from 11/2020 reviewed.  Migraines: These occur a few times per month. Triggered by stress. She was started on Propanolol at her last visit.  She does not follow with neurology.  COPD: She reports chronic cough or SOB.  She is taking Symbicort as prescribed but does not feel like it has been helpful.  There are no PFTs on file.  She does not follow with pulmonology.  She does continue to smoke  GERD: She denies breakthrough on Omeprazole.  There is no upper GI on file.  Hypothyroidism: Post ablative.  Her Levothyroxine was increased 150 mcg at her last visit but she reports she is still taking the 125 mcg.  She did not return for her repeat labs.  She does not follow with endocrinology.  HLD with Aortic Atherosclerosis: Her last LDL was 142, triglycerides 81, 07/2021.  She is not taking any cholesterol-lowering medication or aspirin at this time.  She tries to consume a low-fat diet.  Anxiety and Depression: Chronic,  but she is not currently taking any medications for this. She has been feeling more on edge and panicky. She reports this is related to work stress.  She is not currently seeing a therapist.  She denies SI/HI.  Prediabetes: Her last A1c was 5.9%, 07/2021.  She is not taking any oral diabetic medication at this time.  She does not check her sugars.  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 18 g 0   Aspirin-Salicylamide-Caffeine (BC HEADACHE POWDER PO) Take by mouth 3 (three) times daily as needed.     budesonide-formoterol (SYMBICORT) 80-4.5 MCG/ACT inhaler Inhale 2 puffs into the lungs 2 (two) times daily. 1 each 2   levothyroxine (SYNTHROID) 150 MCG tablet Take 1 tablet (150 mcg total) by mouth daily. 30 tablet 0   lisinopril-hydrochlorothiazide (ZESTORETIC) 20-25 MG tablet Take 2 tablets by mouth daily. 180 tablet 0   omeprazole (PRILOSEC) 20 MG capsule Take 1 capsule (20 mg total) by mouth 2 (two) times daily before a meal. (Patient taking differently: Take 20 mg by mouth as needed.) 180 capsule 1   propranolol ER (INDERAL LA) 60 MG 24 hr capsule Take 1 capsule (60 mg total) by mouth daily. 30 capsule 2   simethicone (MYLICON) 125 MG chewable tablet Chew 125 mg by mouth every 6 (six) hours as needed for flatulence.     varenicline (CHANTIX PAK) 0.5 MG X 11 & 1 MG X 42 tablet Take one 0.5 mg tab by mouth once daily for 3 days, increase to one 0.5 mg twice daily for 4 days, then increase to one 1 mg twice daily. 53 tablet 0  No current facility-administered medications for this visit.    No Known Allergies  Family History  Problem Relation Age of Onset   Diabetes Mother    Alcohol abuse Father    Diabetes Father    Cancer Father    Cancer Paternal Aunt    Alcohol abuse Brother     Social History   Socioeconomic History   Marital status: Single    Spouse name: Not on file   Number of children: Not on file   Years of education: Not on file   Highest education level: Not on file  Occupational History   Not on file  Tobacco Use   Smoking status: Every Day    Packs/day: 0.25    Years: 50.00    Pack years: 12.50    Types: Cigarettes    Start date: 07/25/1975   Smokeless tobacco: Never  Vaping Use   Vaping Use: Never used   Substance and Sexual Activity   Alcohol use: No    Alcohol/week: 0.0 standard drinks   Drug use: No   Sexual activity: Yes  Other Topics Concern   Not on file  Social History Narrative   Not on file   Social Determinants of Health   Financial Resource Strain: Not on file  Food Insecurity: Not on file  Transportation Needs: Not on file  Physical Activity: Not on file  Stress: Not on file  Social Connections: Not on file  Intimate Partner Violence: Not on file     Constitutional: Patient reports intermittent headaches.  Denies fever, malaise, fatigue, or abrupt weight changes.  HEENT: Denies eye pain, eye redness, ear pain, ringing in the ears, wax buildup, runny nose, nasal congestion, bloody nose, or sore throat. Respiratory: Patient reports chronic cough and shortness of breath.  Denies difficulty breathing.   Cardiovascular: Denies chest pain, chest tightness, palpitations or swelling in the hands or feet.  Gastrointestinal: Patient reports abdominal bloating.  Denies abdominal pain, constipation, diarrhea or blood in the stool.  GU: Denies urgency, frequency, pain with urination, burning sensation, blood in urine, odor or discharge. Musculoskeletal: Denies decrease in range of motion, difficulty with gait, muscle pain or joint pain and swelling.  Skin: Denies redness, rashes, lesions or ulcercations.  Neurological: Denies dizziness, difficulty with memory, difficulty with speech or problems with balance and coordination.  Psych: Patient has a history of anxiety depression.  Denies SI/HI.  No other specific complaints in a complete review of systems (except as listed in HPI above).  Objective:   Physical Exam  BP 134/86 (BP Location: Left Arm, Patient Position: Sitting, Cuff Size: Large)   Pulse (!) 59   Temp (!) 96.6 F (35.9 C) (Temporal)   Wt 189 lb (85.7 kg)   SpO2 99%   BMI 32.44 kg/m  Wt Readings from Last 3 Encounters:  12/14/21 189 lb (85.7 kg)  08/16/21  187 lb (84.8 kg)  08/02/21 188 lb (85.3 kg)    General: Appears her stated age, obese, in NAD. Skin: Warm, dry and intact.  HEENT: Head: normal shape and size; Eyes: sclera white, no icterus, conjunctiva pink, PERRLA and EOMs intact;  Neck:  Neck supple, trachea midline. No masses, lumps or thyromegaly present.  Cardiovascular: Normal rate and rhythm. S1,S2 noted.  No murmur, rubs or gallops noted. No JVD or BLE edema. No carotid bruits noted. Pulmonary/Chest: Normal effort and positive vesicular breath sounds. No respiratory distress. No wheezes, rales or ronchi noted.  Abdomen: Normal bowel sounds.  Musculoskeletal: No  difficulty with gait.  Neurological: Alert and oriented.  Psychiatric: Mood and affect normal. Behavior is normal. Judgment and thought content normal.    BMET    Component Value Date/Time   NA 143 08/02/2021 1112   NA 140 03/08/2017 1909   K 4.2 08/02/2021 1112   CL 106 08/02/2021 1112   CO2 27 08/02/2021 1112   GLUCOSE 95 08/02/2021 1112   BUN 22 08/02/2021 1112   BUN 10 03/08/2017 1909   CREATININE 0.93 08/02/2021 1112   CALCIUM 9.8 08/02/2021 1112   GFRNONAA 43 (L) 12/21/2020 1732   GFRNONAA 59 (L) 02/02/2020 1142   GFRAA 69 02/02/2020 1142    Lipid Panel     Component Value Date/Time   CHOL 224 (H) 08/02/2021 1112   CHOL 233 (H) 03/08/2017 1909   TRIG 81 08/02/2021 1112   HDL 64 08/02/2021 1112   HDL 62 03/08/2017 1909   CHOLHDL 3.5 08/02/2021 1112   VLDL 9 05/19/2018 1549   LDLCALC 142 (H) 08/02/2021 1112    CBC    Component Value Date/Time   WBC 9.4 08/02/2021 1112   RBC 4.25 08/02/2021 1112   HGB 12.9 08/02/2021 1112   HGB 13.0 03/08/2017 1909   HCT 39.4 08/02/2021 1112   HCT 39.8 03/08/2017 1909   PLT 284 08/02/2021 1112   PLT 271 03/08/2017 1909   MCV 92.7 08/02/2021 1112   MCV 89 03/08/2017 1909   MCH 30.4 08/02/2021 1112   MCHC 32.7 08/02/2021 1112   RDW 13.6 08/02/2021 1112   RDW 14.4 03/08/2017 1909   LYMPHSABS 2,897  10/08/2019 0958   LYMPHSABS 3.2 (H) 01/19/2015 0808   MONOABS 0.7 05/19/2018 1116   EOSABS 61 10/08/2019 0958   EOSABS 0.1 01/19/2015 0808   BASOSABS 61 10/08/2019 0958   BASOSABS 0.0 01/19/2015 0808    Hgb A1C Lab Results  Component Value Date   HGBA1C 5.9 (H) 08/02/2021           Assessment & Plan:    Nicki Reaper, NP

## 2021-12-14 NOTE — Assessment & Plan Note (Signed)
A1c today Encouraged her to consume a low-carb diet and exercise for weight loss 

## 2021-12-14 NOTE — Assessment & Plan Note (Signed)
Lipid profile today She declines statin therapy at her last visit but will reconsider if LDL remains elevated She will start baby Aspirin daily Encouraged her to consume a low-fat diet

## 2021-12-14 NOTE — Assessment & Plan Note (Signed)
Controlled on Lisinopril HCT and Propanolol, refilled today Reinforced DASH diet and exercise for weight loss

## 2021-12-15 LAB — COMPLETE METABOLIC PANEL WITH GFR
AG Ratio: 1.9 (calc) (ref 1.0–2.5)
ALT: 15 U/L (ref 6–29)
AST: 13 U/L (ref 10–35)
Albumin: 4.1 g/dL (ref 3.6–5.1)
Alkaline phosphatase (APISO): 136 U/L (ref 37–153)
BUN: 18 mg/dL (ref 7–25)
CO2: 26 mmol/L (ref 20–32)
Calcium: 9.5 mg/dL (ref 8.6–10.4)
Chloride: 107 mmol/L (ref 98–110)
Creat: 0.88 mg/dL (ref 0.50–1.05)
Globulin: 2.2 g/dL (calc) (ref 1.9–3.7)
Glucose, Bld: 99 mg/dL (ref 65–139)
Potassium: 4 mmol/L (ref 3.5–5.3)
Sodium: 141 mmol/L (ref 135–146)
Total Bilirubin: 0.3 mg/dL (ref 0.2–1.2)
Total Protein: 6.3 g/dL (ref 6.1–8.1)
eGFR: 75 mL/min/{1.73_m2} (ref >=60)

## 2021-12-15 LAB — LIPID PANEL
Cholesterol: 188 mg/dL (ref <200)
HDL: 60 mg/dL (ref >=50)
LDL Cholesterol (Calc): 113 mg/dL (calc) — ABNORMAL HIGH
Non-HDL Cholesterol (Calc): 128 mg/dL (calc) (ref <130)
Total CHOL/HDL Ratio: 3.1 (calc) (ref <5.0)
Triglycerides: 61 mg/dL (ref <150)

## 2021-12-15 LAB — HEMOGLOBIN A1C
Hgb A1c MFr Bld: 5.9 % of total Hgb — ABNORMAL HIGH (ref <5.7)
Mean Plasma Glucose: 123 mg/dL
eAG (mmol/L): 6.8 mmol/L

## 2021-12-15 LAB — T4, FREE: Free T4: 0.7 ng/dL — ABNORMAL LOW (ref 0.8–1.8)

## 2021-12-15 LAB — TSH: TSH: 38.12 mIU/L — ABNORMAL HIGH (ref 0.40–4.50)

## 2022-03-09 IMAGING — CR DG CHEST 2V
1 series · 2 of 2 positions shown · non-contrast
Comparison: 12/21/2020.

CLINICAL DATA: Cough for 1 week.  Fever.  Headache and congestion.

EXAM:
CHEST - 2 VIEW

[Series 1: dg chest 2 view · 0.14mm/px · 2 of 2 slices shown]
[im 1/2]
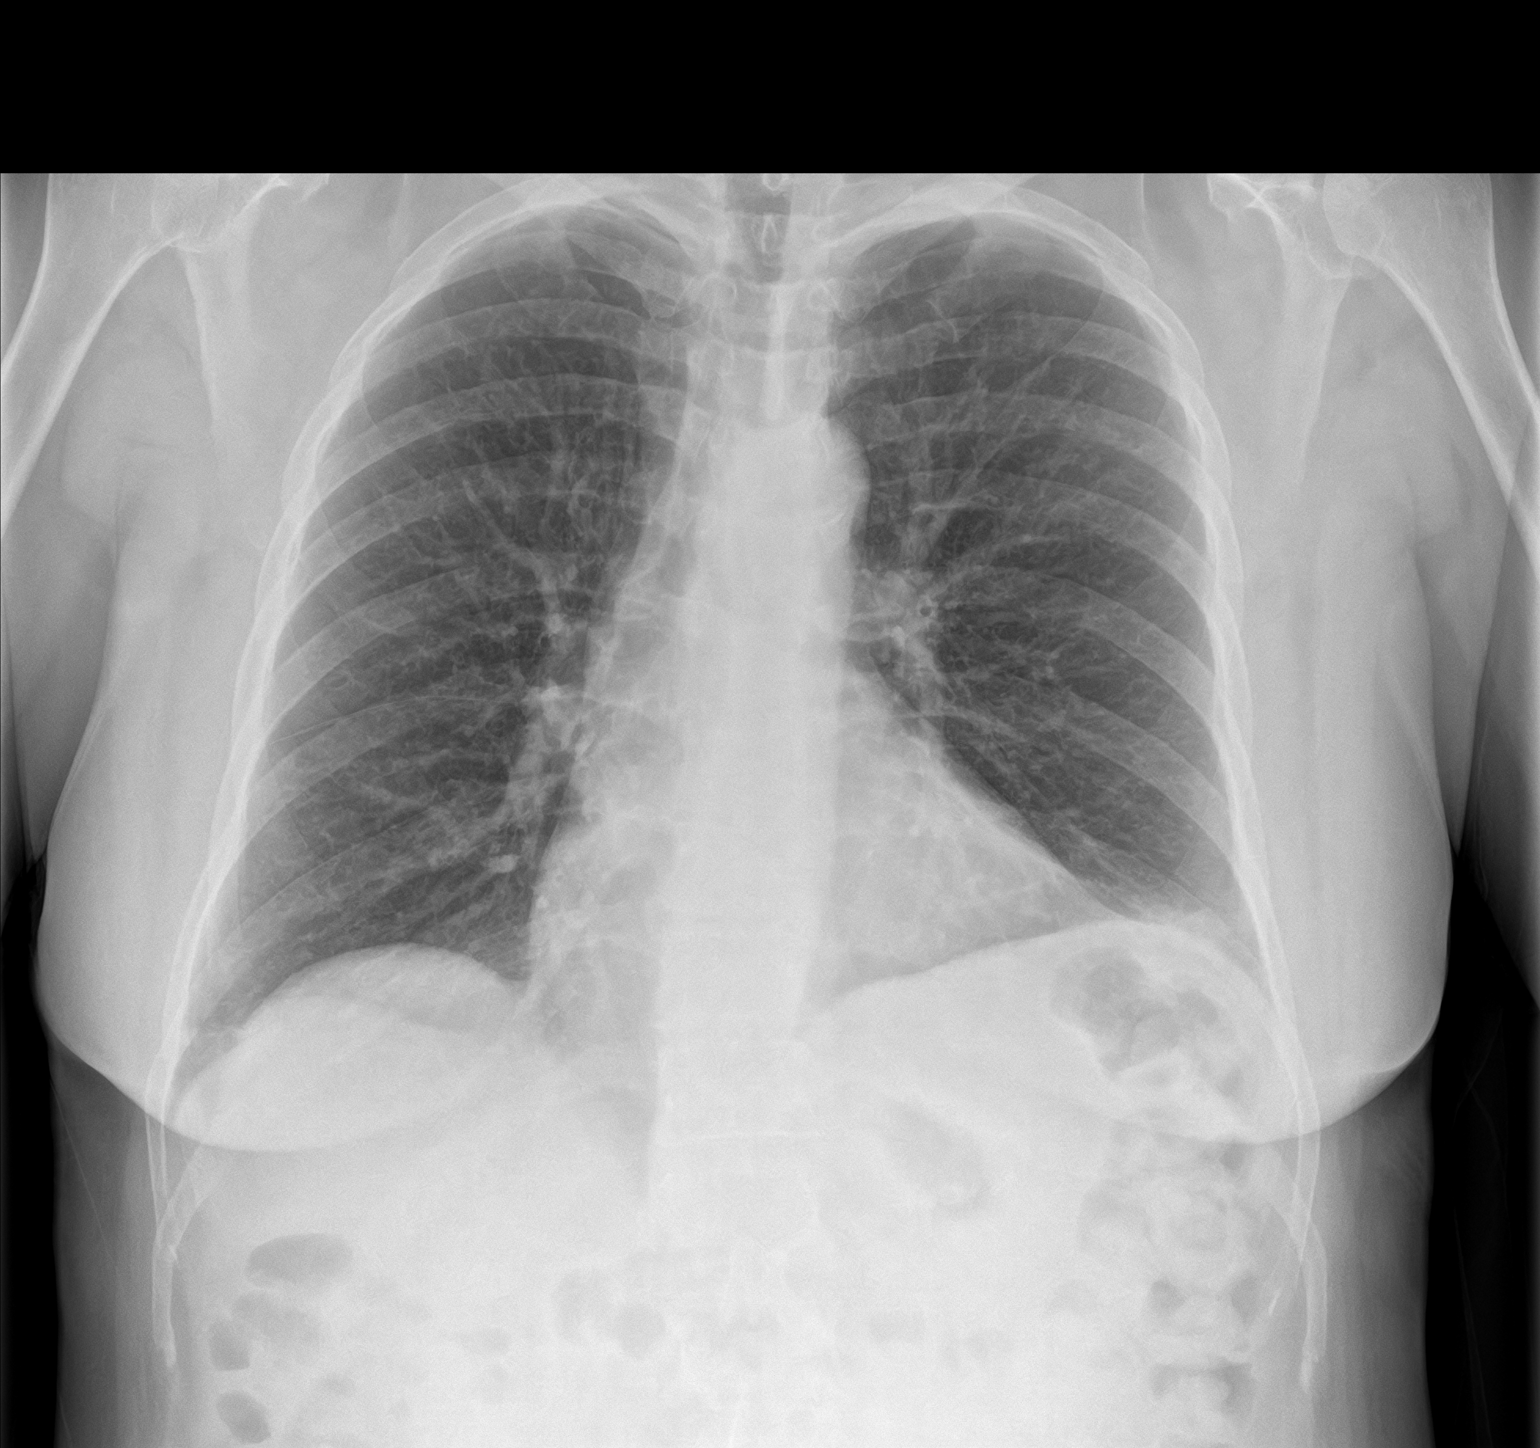
[im 2/2]
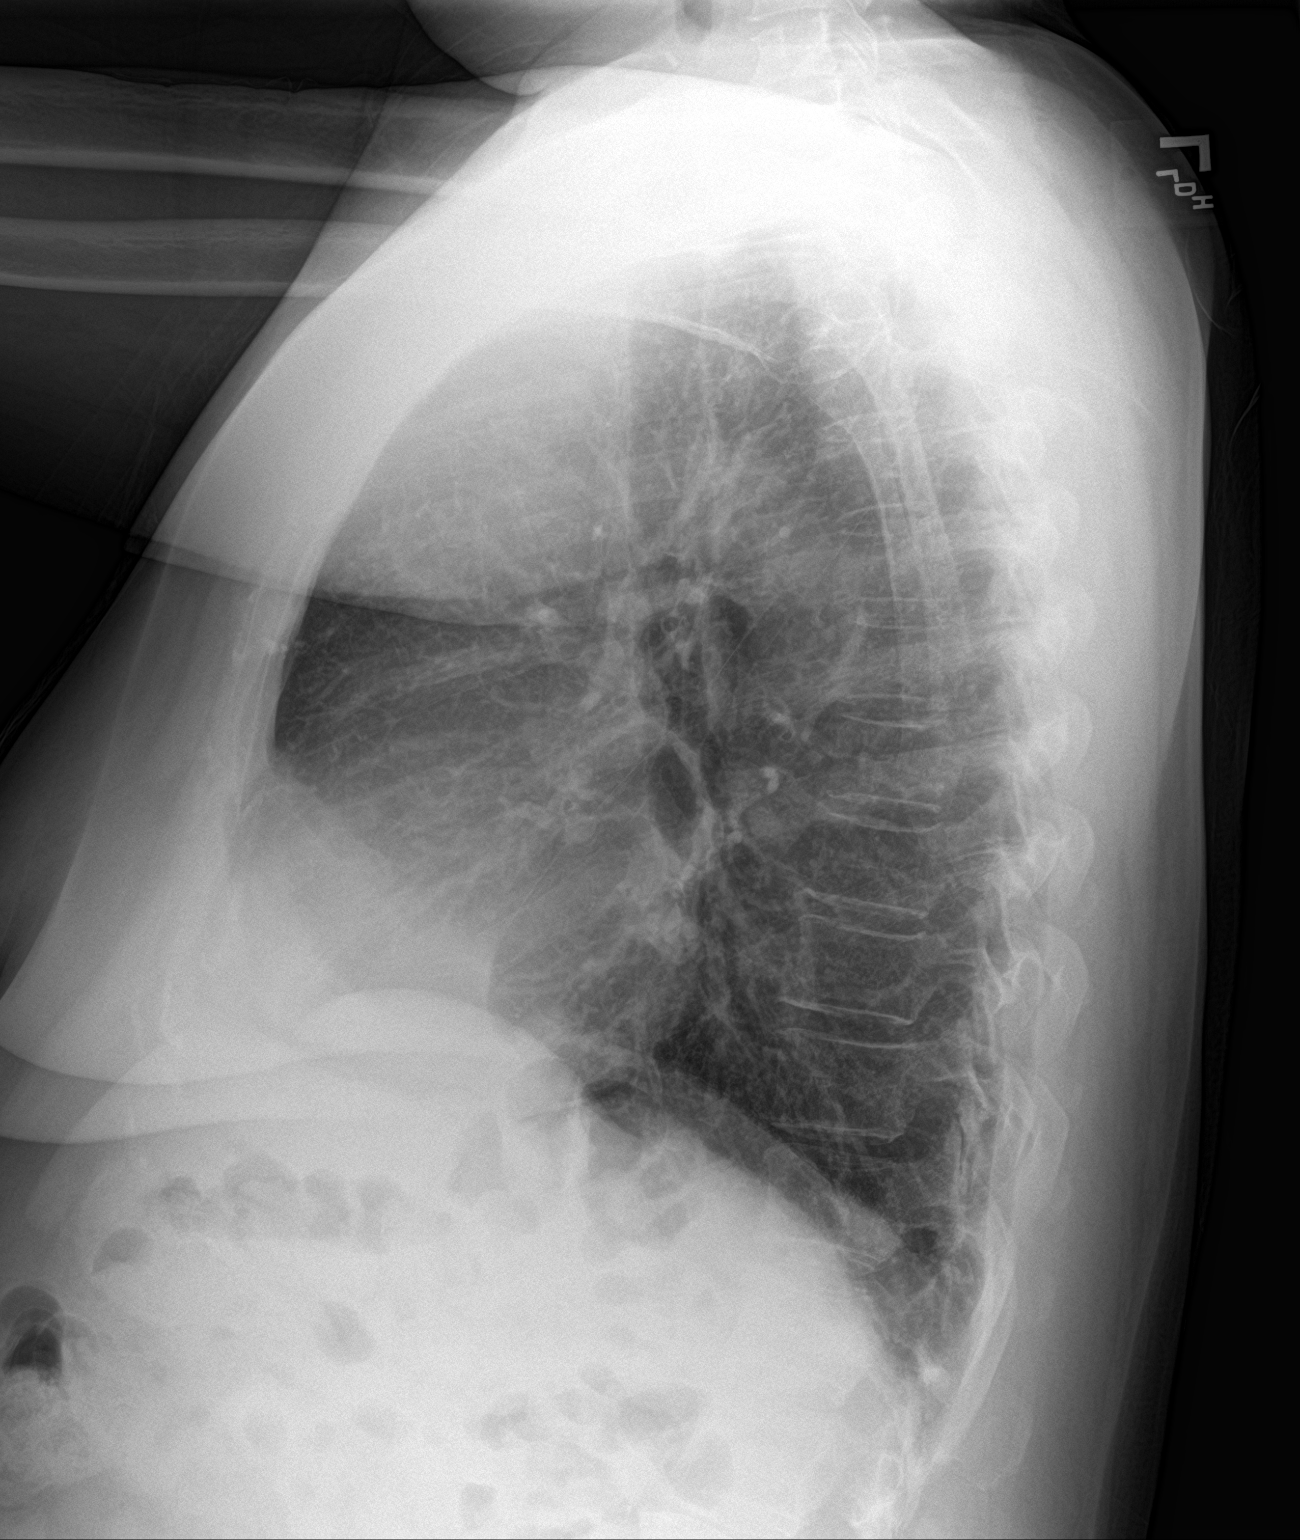

[2 of 2 positions shown; findings below may reference images not displayed]

FINDINGS: Cardiac silhouette is normal in size. Normal mediastinal and hilar
contours.

Lungs are clear.  No pleural effusion or pneumothorax.

Skeletal structures are intact.
IMPRESSION: No active cardiopulmonary disease.

## 2022-07-31 ENCOUNTER — Other Ambulatory Visit: Payer: Self-pay | Admitting: Internal Medicine

## 2022-07-31 DIAGNOSIS — E890 Postprocedural hypothyroidism: Secondary | ICD-10-CM

## 2022-08-01 NOTE — Telephone Encounter (Signed)
Requested medications are due for refill today.  unsure  Requested medications are on the active medications list.  no  Last refill. 08/01/2021  Future visit scheduled.   no  Notes to clinic.  Abnormal labs.  Med list has only 150cmg. Med discontinued 08/16/2021     Requested Prescriptions  Pending Prescriptions Disp Refills   levothyroxine (SYNTHROID) 125 MCG tablet [Pharmacy Med Name: Levothyroxine Sodium 125 MCG Oral Tablet] 90 tablet 0    Sig: Take 1 tablet by mouth once daily     Endocrinology:  Hypothyroid Agents Failed - 07/31/2022  2:25 PM      Failed - TSH in normal range and within 360 days    TSH  Date Value Ref Range Status  12/14/2021 38.12 (H) 0.40 - 4.50 mIU/L Final         Passed - Valid encounter within last 12 months    Recent Outpatient Visits           7 months ago Encounter for screening mammogram for malignant neoplasm of breast   [facility] [place], Coralie Keens, NP   11 months ago Postablative hypothyroidism   [place], NP   12 months ago Essential hypertension   [facility] [place], Coralie Keens, NP   1 year ago AKI (acute kidney injury) [facility])   [facility], Coralie Keens, NP   1 year ago Shortness of breath   [facility], Lupita Raider, [place]

## 2022-08-30 ENCOUNTER — Ambulatory Visit: Payer: Self-pay | Admitting: Internal Medicine

## 2022-09-13 ENCOUNTER — Ambulatory Visit (INDEPENDENT_AMBULATORY_CARE_PROVIDER_SITE_OTHER): Payer: 59 | Admitting: Internal Medicine

## 2022-09-13 ENCOUNTER — Encounter: Payer: Self-pay | Admitting: Internal Medicine

## 2022-09-13 VITALS — BP 168/102 | HR 71 | Temp 95.9°F | Wt 204.0 lb

## 2022-09-13 DIAGNOSIS — Z0001 Encounter for general adult medical examination with abnormal findings: Secondary | ICD-10-CM

## 2022-09-13 DIAGNOSIS — E7800 Pure hypercholesterolemia, unspecified: Secondary | ICD-10-CM

## 2022-09-13 DIAGNOSIS — R0602 Shortness of breath: Secondary | ICD-10-CM | POA: Diagnosis not present

## 2022-09-13 DIAGNOSIS — R140 Abdominal distension (gaseous): Secondary | ICD-10-CM | POA: Diagnosis not present

## 2022-09-13 DIAGNOSIS — Z122 Encounter for screening for malignant neoplasm of respiratory organs: Secondary | ICD-10-CM

## 2022-09-13 DIAGNOSIS — R7303 Prediabetes: Secondary | ICD-10-CM

## 2022-09-13 DIAGNOSIS — K219 Gastro-esophageal reflux disease without esophagitis: Secondary | ICD-10-CM

## 2022-09-13 DIAGNOSIS — J439 Emphysema, unspecified: Secondary | ICD-10-CM

## 2022-09-13 DIAGNOSIS — I10 Essential (primary) hypertension: Secondary | ICD-10-CM

## 2022-09-13 DIAGNOSIS — R0789 Other chest pain: Secondary | ICD-10-CM

## 2022-09-13 DIAGNOSIS — F17200 Nicotine dependence, unspecified, uncomplicated: Secondary | ICD-10-CM | POA: Diagnosis not present

## 2022-09-13 DIAGNOSIS — E890 Postprocedural hypothyroidism: Secondary | ICD-10-CM

## 2022-09-13 DIAGNOSIS — Z6835 Body mass index (BMI) 35.0-35.9, adult: Secondary | ICD-10-CM

## 2022-09-13 DIAGNOSIS — R6881 Early satiety: Secondary | ICD-10-CM

## 2022-09-13 DIAGNOSIS — Z23 Encounter for immunization: Secondary | ICD-10-CM | POA: Diagnosis not present

## 2022-09-13 MED ORDER — TRELEGY ELLIPTA 100-62.5-25 MCG/ACT IN AEPB: 1.0000 | INHALATION_SPRAY | Freq: Every day | RESPIRATORY_TRACT | 11 refills | Status: DC

## 2022-09-13 NOTE — Assessment & Plan Note (Signed)
Encourage diet and exercise for weight loss 

## 2022-09-13 NOTE — Assessment & Plan Note (Signed)
Deteriorated We will change Breztri to Trelegy Continue albuterol as needed Encourage smoking cessation

## 2022-09-13 NOTE — Progress Notes (Signed)
Subjective:    Patient ID: Gina Wells, female    DOB: 14-Jun-1961, 62 y.o.   MRN: [id number]  HPI  Pt presents to the clinic today for her annual exam.  Of note, her BP today is 164/94.  She reports she is takingLlisinopril HCT and Propranolol as prescribed.  ECG from 05/2021 reviewed.  Flu: never Tetanus: > 10 years ago Pneumovax: never Shingrix: never Pap smear: hysterectomy Mammogram: >2 years ago Bone density: Never Colon screening: Never Vision screening: As needed Dentist: Biannually  Diet: She does not meat.  She does consume fruits and vegetables.  She does eat some fried foods. 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 18 g 0   aspirin EC 81 MG tablet Take 1 tablet (81 mg total) by mouth daily. Swallow whole. 30 tablet 12   Aspirin-Salicylamide-Caffeine (BC HEADACHE POWDER PO) Take by mouth 3 (three) times daily as needed.     Budeson-Glycopyrrol-Formoterol (BREZTRI AEROSPHERE) 160-9-4.8 MCG/ACT AERO Inhale 2 puffs into the lungs 2 (two) times daily. 10.7 g 5   levothyroxine (SYNTHROID) 150 MCG tablet Take 1 tablet (150 mcg total) by mouth daily. 30 tablet 0   lisinopril-hydrochlorothiazide (ZESTORETIC) 20-25 MG tablet Take 2 tablets by mouth daily. 180 tablet 1   omeprazole (PRILOSEC) 20 MG capsule Take 1 capsule (20 mg total) by mouth 2 (two) times daily before a meal. (Patient taking differently: Take 20 mg by mouth as needed.) 180 capsule 1   propranolol ER (INDERAL LA) 60 MG 24 hr  capsule Take 1 capsule (60 mg total) by mouth daily. 90 capsule 1   simethicone (MYLICON) 0000000 MG chewable tablet Chew 125 mg by mouth every 6 (six) hours as needed for flatulence.     venlafaxine XR (EFFEXOR XR) 37.5 MG 24 hr capsule Take 1 capsule (37.5 mg total) by mouth daily with breakfast. 90 capsule 0   No current facility-administered medications for this visit.    No Known Allergies  Family History  Problem Relation Age of Onset   Diabetes Mother    Alcohol abuse Father    Diabetes Father    Cancer Father    Cancer Paternal Aunt    Alcohol abuse Brother     Social History   Socioeconomic History   Marital status: Single    Spouse name: Not on file   Number of children: Not on file   Years of education: Not on file   Highest education level: Not on file  Occupational History   Not on file  Tobacco Use   Smoking status: Every Day    Packs/day: 0.25    Years: 50.00    Total pack years: 12.50    Types: Cigarettes    Start date: 07/25/1975   Smokeless tobacco: Never  Vaping Use   Vaping Use: Never used  Substance and Sexual Activity   Alcohol use: No    Alcohol/week: 0.0 standard drinks  of alcohol   Drug use: No   Sexual activity: Yes  Other Topics Concern   Not on file  Social History Narrative   Not on file   Social Determinants of Health   Financial Resource Strain: Not on file  Food Insecurity: Not on file  Transportation Needs: Not on file  Physical Activity: Not on file  Stress: Not on file  Social Connections: Not on file  Intimate Partner Violence: Not on file     Constitutional: Patient reports fatigue, unintentional weight gain.  Denies fever, malaise, headache .  HEENT: Denies eye pain, eye redness, ear pain, ringing in the ears, wax buildup, runny nose, nasal congestion, bloody nose, or sore throat. Respiratory: Pt reports shortness of breath. Denies difficulty breathing, cough or sputum production.   Cardiovascular: Pt reports chest  tightness, swelling in her hands and feet. Denies chest pain, palpitations.  Gastrointestinal: Pt reports poor appetite, early satiety, bloating, gassiness, intermittent constipation. Denies abdominal pain, bloating, diarrhea or blood in the stool.  GU: Denies urgency, frequency, pain with urination, burning sensation, blood in urine, odor or discharge. Musculoskeletal: Denies decrease in range of motion, difficulty with gait, muscle pain or joint pain and swelling.  Skin: Denies redness, rashes, lesions or ulcercations.  Neurological: Patient reports numbness and tingling of her upper and lower extremities.  Denies dizziness, difficulty with memory, difficulty with speech or problems with balance and coordination.  Psych: Patient has a history of anxiety and depression.  Denies SI/HI.  No other specific complaints in a complete review of systems (except as listed in HPI above).  Objective:   Physical Exam   BP (!) 168/102 (BP Location: Right Arm, Patient Position: Sitting, Cuff Size: Normal)   Pulse 71   Temp (!) 95.9 F (35.5 C) (Temporal)   Wt 204 lb (92.5 kg)   SpO2 99%   BMI 35.02 kg/m  Wt Readings from Last 3 Encounters:  09/13/22 204 lb (92.5 kg)  12/14/21 189 lb (85.7 kg)  08/16/21 187 lb (84.8 kg)    General: Appears her stated age, obese in NAD. Skin: Warm, dry and intact. No rashes noted. HEENT: Head: normal shape and size; Eyes: sclera white, no icterus, conjunctiva pink, PERRLA and EOMs intact;  Neck:  Neck supple, trachea midline. No masses, lumps present.  Thyromegaly noted. Cardiovascular: Normal rate and rhythm. S1,S2 noted.  No murmur, rubs or gallops noted. No JVD.  Trace BLE edema. No carotid bruits noted. Pulmonary/Chest: Normal effort and positive vesicular breath sounds. No respiratory distress. No wheezes, rales or ronchi noted.  Abdomen: Soft and nontender. Normal bowel sounds.  Musculoskeletal: Strength 4/5 BUE/BLE.  No difficulty with gait.   Neurological: Alert and oriented. Cranial nerves II-XII grossly intact. Coordination normal.  Psychiatric: Mood and affect flat.  She appears to be trembling.  Judgment and thought content normal.    BMET    Component Value Date/Time   NA 141 12/14/2021 0905   NA 140 03/08/2017 1909   K 4.0 12/14/2021 0905   CL 107 12/14/2021 0905   CO2 26 12/14/2021 0905   GLUCOSE 99 12/14/2021 0905   BUN 18 12/14/2021 0905   BUN 10 03/08/2017 1909   CREATININE 0.88 12/14/2021 0905   CALCIUM 9.5 12/14/2021 0905   GFRNONAA 43 (L) 12/21/2020 1732   GFRNONAA 59 (L) 02/02/2020 1142   GFRAA 69 02/02/2020 1142    Lipid Panel     Component Value Date/Time   CHOL 188 12/14/2021 0905   CHOL 233 (  H) 03/08/2017 1909   TRIG 61 12/14/2021 0905   HDL 60 12/14/2021 0905   HDL 62 03/08/2017 1909   CHOLHDL 3.1 12/14/2021 0905   VLDL 9 05/19/2018 1549   LDLCALC 113 (H) 12/14/2021 0905    CBC    Component Value Date/Time   WBC 9.4 08/02/2021 1112   RBC 4.25 08/02/2021 1112   HGB 12.9 08/02/2021 1112   HGB 13.0 03/08/2017 1909   HCT 39.4 08/02/2021 1112   HCT 39.8 03/08/2017 1909   PLT 284 08/02/2021 1112   PLT 271 03/08/2017 1909   MCV 92.7 08/02/2021 1112   MCV 89 03/08/2017 1909   MCH 30.4 08/02/2021 1112   MCHC 32.7 08/02/2021 1112   RDW 13.6 08/02/2021 1112   RDW 14.4 03/08/2017 1909   LYMPHSABS 2,897 10/08/2019 0958   LYMPHSABS 3.2 (H) 01/19/2015 0808   MONOABS 0.7 05/19/2018 1116   EOSABS 61 10/08/2019 0958   EOSABS 0.1 01/19/2015 0808   BASOSABS 61 10/08/2019 0958   BASOSABS 0.0 01/19/2015 0808    Hgb A1C Lab Results  Component Value Date   HGBA1C 5.9 (H) 12/14/2021           Assessment & Plan:   Preventative Health Maintenance:  Encouraged her to get a flu shot in the fall Tdap today Encouraged her to get her COVID-vaccine She declines Pneumovax Discussed Shingrix vaccine, she will check coverage with her insurance company and schedule a visit if she would like  to have this done She no longer needs to screen for cervical cancer She declines mammogram She declined bone density She declines colon cancer screening Low-dose chest CT lung cancer screening ordered Encouraged her to consume a balanced diet and exercise regimen Advised her to see an eye doctor and dentist annually We will check CBC, c-Met, lipid, TSH, free T4, A1c today  Shortness of Breath, Chest Tightness:  Could be related to her COPD versus new onset heart issue We will change Breztri to Trelegy Continue Albuterol as needed Encouraged light exercise daily Will obtain 2D echo Consider referral to cardiology for stress test  Abdominal Bloating, Early Satiety, Poor Appetite, Constipation, GERD:  Will check c-Met, lipase and H. pylori stool antigen Continue omeprazole for now  RTC in 2 weeks for follow-up HTN Webb Silversmith, NP

## 2022-09-13 NOTE — Assessment & Plan Note (Signed)
Uncontrolled on lisinopril HCT and propranolol Will see her back in 2 weeks, if BP remains elevated, will adjust medication Reinforced DASH diet and exercise for weight loss C-Met today

## 2022-09-13 NOTE — Patient Instructions (Signed)
Health Maintenance for Postmenopausal Women Menopause is a normal process in which your ability to get pregnant comes to an end. This process happens slowly over many months or years, usually between the ages of 48 and 55. Menopause is complete when you have missed your menstrual period for 12 months. It is important to talk with your health care provider about some of the most common conditions that affect women after menopause (postmenopausal women). These include heart disease, cancer, and bone loss (osteoporosis). Adopting a healthy lifestyle and getting preventive care can help to promote your health and wellness. The actions you take can also lower your chances of developing some of these common conditions. What are the signs and symptoms of menopause? During menopause, you may have the following symptoms: Hot flashes. These can be moderate or severe. Night sweats. Decrease in sex drive. Mood swings. Headaches. Tiredness (fatigue). Irritability. Memory problems. Problems falling asleep or staying asleep. Talk with your health care provider about treatment options for your symptoms. Do I need hormone replacement therapy? Hormone replacement therapy is effective in treating symptoms that are caused by menopause, such as hot flashes and night sweats. Hormone replacement carries certain risks, especially as you become older. If you are thinking about using estrogen or estrogen with progestin, discuss the benefits and risks with your health care provider. How can I reduce my risk for heart disease and stroke? The risk of heart disease, heart attack, and stroke increases as you age. One of the causes may be a change in the body's hormones during menopause. This can affect how your body uses dietary fats, triglycerides, and cholesterol. Heart attack and stroke are medical emergencies. There are many things that you can do to help prevent heart disease and stroke. Watch your blood pressure High  blood pressure causes heart disease and increases the risk of stroke. This is more likely to develop in people who have high blood pressure readings or are overweight. Have your blood pressure checked: Every 3-5 years if you are 18-39 years of age. Every year if you are 40 years old or older. Eat a healthy diet  Eat a diet that includes plenty of vegetables, fruits, low-fat dairy products, and lean protein. Do not eat a lot of foods that are high in solid fats, added sugars, or sodium. Get regular exercise Get regular exercise. This is one of the most important things you can do for your health. Most adults should: Try to exercise for at least 150 minutes each week. The exercise should increase your heart rate and make you sweat (moderate-intensity exercise). Try to do strengthening exercises at least twice each week. Do these in addition to the moderate-intensity exercise. Spend less time sitting. Even light physical activity can be beneficial. Other tips Work with your health care provider to achieve or maintain a healthy weight. Do not use any products that contain nicotine or tobacco. These products include cigarettes, chewing tobacco, and vaping devices, such as e-cigarettes. If you need help quitting, ask your health care provider. Know your numbers. Ask your health care provider to check your cholesterol and your blood sugar (glucose). Continue to have your blood tested as directed by your health care provider. Do I need screening for cancer? Depending on your health history and family history, you may need to have cancer screenings at different stages of your life. This may include screening for: Breast cancer. Cervical cancer. Lung cancer. Colorectal cancer. What is my risk for osteoporosis? After menopause, you may be   at increased risk for osteoporosis. Osteoporosis is a condition in which bone destruction happens more quickly than new bone creation. To help prevent osteoporosis or  the bone fractures that can happen because of osteoporosis, you may take the following actions: If you are 19-50 years old, get at least 1,000 mg of calcium and at least 600 international units (IU) of vitamin D per day. If you are older than age 50 but younger than age 70, get at least 1,200 mg of calcium and at least 600 international units (IU) of vitamin D per day. If you are older than age 70, get at least 1,200 mg of calcium and at least 800 international units (IU) of vitamin D per day. Smoking and drinking excessive alcohol increase the risk of osteoporosis. Eat foods that are rich in calcium and vitamin D, and do weight-bearing exercises several times each week as directed by your health care provider. How does menopause affect my mental health? Depression may occur at any age, but it is more common as you become older. Common symptoms of depression include: Feeling depressed. Changes in sleep patterns. Changes in appetite or eating patterns. Feeling an overall lack of motivation or enjoyment of activities that you previously enjoyed. Frequent crying spells. Talk with your health care provider if you think that you are experiencing any of these symptoms. General instructions See your health care provider for regular wellness exams and vaccines. This may include: Scheduling regular health, dental, and eye exams. Getting and maintaining your vaccines. These include: Influenza vaccine. Get this vaccine each year before the flu season begins. Pneumonia vaccine. Shingles vaccine. Tetanus, diphtheria, and pertussis (Tdap) booster vaccine. Your health care provider may also recommend other immunizations. Tell your health care provider if you have ever been abused or do not feel safe at home. Summary Menopause is a normal process in which your ability to get pregnant comes to an end. This condition causes hot flashes, night sweats, decreased interest in sex, mood swings, headaches, or lack  of sleep. Treatment for this condition may include hormone replacement therapy. Take actions to keep yourself healthy, including exercising regularly, eating a healthy diet, watching your weight, and checking your blood pressure and blood sugar levels. Get screened for cancer and depression. Make sure that you are up to date with all your vaccines. This information is not intended to replace advice given to you by your health care provider. Make sure you discuss any questions you have with your health care provider. Document Revised: 11/29/2020 Document Reviewed: 11/29/2020 Elsevier Patient Education  2023 [employer]  

## 2022-09-14 LAB — COMPLETE METABOLIC PANEL WITH GFR
AG Ratio: 1.5 (calc) (ref 1.0–2.5)
ALT: 14 U/L (ref 6–29)
AST: 10 U/L (ref 10–35)
Albumin: 3.8 g/dL (ref 3.6–5.1)
Alkaline phosphatase (APISO): 124 U/L (ref 37–153)
BUN: 14 mg/dL (ref 7–25)
CO2: 26 mmol/L (ref 20–32)
Calcium: 9.2 mg/dL (ref 8.6–10.4)
Chloride: 108 mmol/L (ref 98–110)
Creat: 0.98 mg/dL (ref 0.50–1.05)
Globulin: 2.5 g/dL (calc) (ref 1.9–3.7)
Glucose, Bld: 108 mg/dL — ABNORMAL HIGH (ref 65–99)
Potassium: 4.2 mmol/L (ref 3.5–5.3)
Sodium: 144 mmol/L (ref 135–146)
Total Bilirubin: 0.3 mg/dL (ref 0.2–1.2)
Total Protein: 6.3 g/dL (ref 6.1–8.1)
eGFR: 66 mL/min/{1.73_m2} (ref >=60)

## 2022-09-14 LAB — LIPID PANEL
Cholesterol: 219 mg/dL — ABNORMAL HIGH (ref <200)
HDL: 72 mg/dL (ref >=50)
LDL Cholesterol (Calc): 130 mg/dL (calc) — ABNORMAL HIGH
Non-HDL Cholesterol (Calc): 147 mg/dL (calc) — ABNORMAL HIGH (ref <130)
Total CHOL/HDL Ratio: 3 (calc) (ref <5.0)
Triglycerides: 78 mg/dL (ref <150)

## 2022-09-14 LAB — CBC
HCT: 38 % (ref 35.0–45.0)
Hemoglobin: 12.8 g/dL (ref 11.7–15.5)
MCH: 31.4 pg (ref 27.0–33.0)
MCHC: 33.7 g/dL (ref 32.0–36.0)
MCV: 93.1 fL (ref 80.0–100.0)
MPV: 10.4 fL (ref 7.5–12.5)
Platelets: 201 10*3/uL (ref 140–400)
RBC: 4.08 10*6/uL (ref 3.80–5.10)
RDW: 12.8 % (ref 11.0–15.0)
WBC: 8.5 10*3/uL (ref 3.8–10.8)

## 2022-09-14 LAB — HEMOGLOBIN A1C
Hgb A1c MFr Bld: 6.2 % of total Hgb — ABNORMAL HIGH (ref <5.7)
Mean Plasma Glucose: 131 mg/dL
eAG (mmol/L): 7.3 mmol/L

## 2022-09-14 LAB — LIPASE: Lipase: 108 U/L — ABNORMAL HIGH (ref 7–60)

## 2022-09-14 LAB — TSH: TSH: 109.66 mIU/L — ABNORMAL HIGH (ref 0.40–4.50)

## 2022-09-14 LAB — T4, FREE: Free T4: 0.3 ng/dL — ABNORMAL LOW (ref 0.8–1.8)

## 2022-09-14 MED ORDER — SYNTHROID 200 MCG PO TABS: 200.0000 ug | ORAL_TABLET | Freq: Every day | ORAL | 1 refills | Status: DC

## 2022-09-14 NOTE — Addendum Note (Signed)
Addended by: Jearld Fenton on: 09/14/2022 01:12 PM   Modules accepted: Orders

## 2022-09-18 ENCOUNTER — Telehealth: Payer: Self-pay

## 2022-09-18 NOTE — Telephone Encounter (Signed)
She needs to make an appt to discuss FMLA completion

## 2022-09-18 NOTE — Telephone Encounter (Signed)
Copied from [place] [phone]. Topic: General - Other >> Sep 18, 2022 11:04 AM Everette C wrote: Reason for CRM: The patient shares that they would like to speak with a member of staff about taking time off of work to adjust to their medication   The patient shares that they were seen on 09/13/22 and had medications changed   The patient shares that they need additional time to adjust   Please contact the patient further when possible

## 2022-09-18 NOTE — Telephone Encounter (Signed)
Pt advised.  Apt scheduled for 09/19/2022.  Thanks,   -Mickel Baas

## 2022-09-19 ENCOUNTER — Ambulatory Visit: Payer: 59 | Admitting: Internal Medicine

## 2022-09-19 NOTE — Progress Notes (Deleted)
Subjective:    Patient ID: Gina Wells, female    DOB: 15-Aug-1960, 62 y.o.   MRN: [id number]  HPI  Patient morning to discuss FMLA form completion.  She reports she has not been feeling herself lately.  She reported fatigue, unintentional weight gain, shortness of breath, chest tightness, swelling in her hands and feet, poor appetite, abdominal bloating, reflux, constipation, numbness and tingling in her upper extremities as well as a history of anxiety and depression.  Her recent labs revealed a TSH of 109.66.  She was changed from Levothyroxine to brand name Synthroid.  She would like to take a couple of weeks off work and hopes that the medication will start to work and she will start to feel better.  Of note, at that time her BP was elevated.  She was continued on her Lisinopril HCT and Propranolol.  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 18 g 0   aspirin EC 81 MG tablet Take 1 tablet (81 mg total) by mouth daily. Swallow whole. 30 tablet 12   Aspirin-Salicylamide-Caffeine (BC HEADACHE POWDER PO) Take by mouth 3 (three) times daily as needed.     Fluticasone-Umeclidin-Vilant (TRELEGY ELLIPTA) 100-62.5-25 MCG/ACT AEPB Inhale 1 puff into the lungs daily. 1 each 11   lisinopril-hydrochlorothiazide (ZESTORETIC) 20-25 MG tablet Take 2 tablets by mouth daily. 180 tablet 1   omeprazole (PRILOSEC) 20 MG capsule Take 1 capsule (20 mg total) by mouth 2 (two) times daily before a meal. (Patient taking differently: Take 20 mg by mouth as  needed.) 180 capsule 1   propranolol ER (INDERAL LA) 60 MG 24 hr capsule Take 1 capsule (60 mg total) by mouth daily. 90 capsule 1   simethicone (MYLICON) 0000000 MG chewable tablet Chew 125 mg by mouth every 6 (six) hours as needed for flatulence.     SYNTHROID 200 MCG tablet Take 1 tablet (200 mcg total) by mouth daily before breakfast. 30 tablet 1   venlafaxine XR (EFFEXOR XR) 37.5 MG 24 hr capsule Take 1 capsule (37.5 mg total) by mouth daily with breakfast. 90 capsule 0   No current facility-administered medications for this visit.    No Known Allergies  Family History  Problem Relation Age of Onset   Diabetes Mother    Alcohol abuse Father    Diabetes Father    Cancer Father    Cancer Paternal Aunt    Alcohol abuse Brother     Social History   Socioeconomic History   Marital status: Single    Spouse name: Not on file   Number of children: Not on file   Years of education: Not on file   Highest education level: Not on file  Occupational History   Not on file  Tobacco Use   Smoking status: Every Day    Packs/day: 0.25    Years: 50.00    Total pack years: 12.50    Types: Cigarettes    Start date: 07/25/1975  Smokeless tobacco: Never  Vaping Use   Vaping Use: Never used  Substance and Sexual Activity   Alcohol use: No    Alcohol/week: 0.0 standard drinks of alcohol   Drug use: No   Sexual activity: Yes  Other Topics Concern   Not on file  Social History Narrative   Not on file   Social Determinants of Health   Financial Resource Strain: Not on file  Food Insecurity: Not on file  Transportation Needs: Not on file  Physical Activity: Not on file  Stress: Not on file  Social Connections: Not on file  Intimate Partner Violence: Not on file     Constitutional: Patient reports fatigue, unintentional weight gain.  Denies fever, malaise, headache.  HEENT: Denies eye pain, eye redness, ear pain, ringing in the ears, wax buildup, runny nose, nasal congestion, bloody  nose, or sore throat. Respiratory: Patient reports shortness of breath.  Denies difficulty breathing, cough or sputum production.   Cardiovascular: Patient reports chest tightness, swelling in her hands and feet.  Denies chest pain, palpitations.  Gastrointestinal: Patient reports poor appetite.  Denies abdominal pain, bloating, constipation, diarrhea or blood in the stool.  GU: Denies urgency, frequency, pain with urination, burning sensation, blood in urine, odor or discharge. Musculoskeletal: Denies decrease in range of motion, difficulty with gait, muscle pain or joint pain and swelling.  Skin: Denies redness, rashes, lesions or ulcercations.  Neurological: Patient reports numbness and tingling in her upper and lower extremities.  Denies dizziness, difficulty with memory, difficulty with speech or problems with balance and coordination.  Psych: Patient has a history of anxiety and depression.  Denies SI/HI.  No other specific complaints in a complete review of systems (except as listed in HPI above).     Objective:   Physical Exam  There were no vitals taken for this visit. Wt Readings from Last 3 Encounters:  09/13/22 204 lb (92.5 kg)  12/14/21 189 lb (85.7 kg)  08/16/21 187 lb (84.8 kg)    General: Appears their stated age, well developed, well nourished in NAD. Skin: Warm, dry and intact. No rashes, lesions or ulcerations noted. HEENT: Head: normal shape and size; Eyes: sclera white, no icterus, conjunctiva pink, PERRLA and EOMs intact; Ears: Tm's gray and intact, normal light reflex; Nose: mucosa pink and moist, septum midline; Throat/Mouth: Teeth present, mucosa pink and moist, no exudate, lesions or ulcerations noted.  Neck:  Neck supple, trachea midline. No masses, lumps or thyromegaly present.  Cardiovascular: Normal rate and rhythm. S1,S2 noted.  No murmur, rubs or gallops noted. No JVD or BLE edema. No carotid bruits noted. Pulmonary/Chest: Normal effort and positive  vesicular breath sounds. No respiratory distress. No wheezes, rales or ronchi noted.  Abdomen: Soft and nontender. Normal bowel sounds. No distention or masses noted. Liver, spleen and kidneys non palpable. Musculoskeletal: Normal range of motion. No signs of joint swelling. No difficulty with gait.  Neurological: Alert and oriented. Cranial nerves II-XII grossly intact. Coordination normal.  Psychiatric: Mood and affect normal. Behavior is normal. Judgment and thought content normal.    BMET    Component Value Date/Time   NA 144 09/13/2022 0829   NA 140 03/08/2017 1909   K 4.2 09/13/2022 0829   CL 108 09/13/2022 0829   CO2 26 09/13/2022 0829   GLUCOSE 108 (H) 09/13/2022 0829   BUN 14 09/13/2022 0829   BUN 10 03/08/2017 1909   CREATININE 0.98 09/13/2022 0829   CALCIUM 9.2 09/13/2022 0829   GFRNONAA 43 (  L) 12/21/2020 1732   GFRNONAA 59 (L) 02/02/2020 1142   GFRAA 69 02/02/2020 1142    Lipid Panel     Component Value Date/Time   CHOL 219 (H) 09/13/2022 0829   CHOL 233 (H) 03/08/2017 1909   TRIG 78 09/13/2022 0829   HDL 72 09/13/2022 0829   HDL 62 03/08/2017 1909   CHOLHDL 3.0 09/13/2022 0829   VLDL 9 05/19/2018 1549   LDLCALC 130 (H) 09/13/2022 0829    CBC    Component Value Date/Time   WBC 8.5 09/13/2022 0829   RBC 4.08 09/13/2022 0829   HGB 12.8 09/13/2022 0829   HGB 13.0 03/08/2017 1909   HCT 38.0 09/13/2022 0829   HCT 39.8 03/08/2017 1909   PLT 201 09/13/2022 0829   PLT 271 03/08/2017 1909   MCV 93.1 09/13/2022 0829   MCV 89 03/08/2017 1909   MCH 31.4 09/13/2022 0829   MCHC 33.7 09/13/2022 0829   RDW 12.8 09/13/2022 0829   RDW 14.4 03/08/2017 1909   LYMPHSABS 2,897 10/08/2019 0958   LYMPHSABS 3.2 (H) 01/19/2015 0808   MONOABS 0.7 05/19/2018 1116   EOSABS 61 10/08/2019 0958   EOSABS 0.1 01/19/2015 0808   BASOSABS 61 10/08/2019 0958   BASOSABS 0.0 01/19/2015 0808    Hgb A1C Lab Results  Component Value Date   HGBA1C 6.2 (H) 09/13/2022             Assessment & Plan:   Encounter for Form Completion with Patient, Hypothyroidism:  FMLA forms completed, copy will be scanned to chart and faxed back She will continue on brand-name Synthroid 200 mcg daily Will also place referral to endocrinology for further evaluation of symptoms  RTC in 3 to week for follow-up of HTN and repeat TSH, 6 months for follow-up of chronic conditions Webb Silversmith, NP

## 2022-09-21 ENCOUNTER — Other Ambulatory Visit: Payer: Self-pay | Admitting: Internal Medicine

## 2022-09-21 DIAGNOSIS — R0602 Shortness of breath: Secondary | ICD-10-CM

## 2022-09-21 DIAGNOSIS — R140 Abdominal distension (gaseous): Secondary | ICD-10-CM

## 2022-09-21 DIAGNOSIS — I10 Essential (primary) hypertension: Secondary | ICD-10-CM

## 2022-09-21 DIAGNOSIS — F17200 Nicotine dependence, unspecified, uncomplicated: Secondary | ICD-10-CM

## 2022-09-21 DIAGNOSIS — Z23 Encounter for immunization: Secondary | ICD-10-CM

## 2022-09-21 DIAGNOSIS — R6881 Early satiety: Secondary | ICD-10-CM

## 2022-09-21 DIAGNOSIS — E890 Postprocedural hypothyroidism: Secondary | ICD-10-CM

## 2022-09-21 DIAGNOSIS — R0789 Other chest pain: Secondary | ICD-10-CM

## 2022-09-21 DIAGNOSIS — Z122 Encounter for screening for malignant neoplasm of respiratory organs: Secondary | ICD-10-CM

## 2022-09-21 DIAGNOSIS — E7800 Pure hypercholesterolemia, unspecified: Secondary | ICD-10-CM

## 2022-09-21 DIAGNOSIS — J439 Emphysema, unspecified: Secondary | ICD-10-CM

## 2022-09-21 DIAGNOSIS — Z0001 Encounter for general adult medical examination with abnormal findings: Secondary | ICD-10-CM

## 2022-09-21 DIAGNOSIS — R7303 Prediabetes: Secondary | ICD-10-CM

## 2022-09-21 DIAGNOSIS — K219 Gastro-esophageal reflux disease without esophagitis: Secondary | ICD-10-CM

## 2022-09-27 ENCOUNTER — Ambulatory Visit (INDEPENDENT_AMBULATORY_CARE_PROVIDER_SITE_OTHER): Payer: 59 | Admitting: Internal Medicine

## 2022-09-27 ENCOUNTER — Encounter: Payer: Self-pay | Admitting: Internal Medicine

## 2022-09-27 VITALS — BP 164/100 | HR 75 | Temp 96.4°F | Wt 203.0 lb

## 2022-09-27 DIAGNOSIS — E890 Postprocedural hypothyroidism: Secondary | ICD-10-CM

## 2022-09-27 DIAGNOSIS — E6609 Other obesity due to excess calories: Secondary | ICD-10-CM | POA: Diagnosis not present

## 2022-09-27 DIAGNOSIS — I10 Essential (primary) hypertension: Secondary | ICD-10-CM

## 2022-09-27 DIAGNOSIS — Z6834 Body mass index (BMI) 34.0-34.9, adult: Secondary | ICD-10-CM

## 2022-09-27 MED ORDER — AMLODIPINE-OLMESARTAN 10-40 MG PO TABS: 1.0000 | ORAL_TABLET | Freq: Every day | ORAL | 0 refills | Status: DC

## 2022-09-27 MED ORDER — FUROSEMIDE 20 MG PO TABS: 20.0000 mg | ORAL_TABLET | Freq: Every day | ORAL | 0 refills | Status: DC

## 2022-09-27 NOTE — Patient Instructions (Signed)
Cooking With Less Salt Cooking with less salt is one way to reduce the amount of sodium you get from food. Sodium is one of the elements that make up salt. It is found naturally in foods and is also added to certain foods. Depending on your condition and overall health, your health care provider or dietitian may recommend that you reduce your sodium intake. Most people should have less than 2,300 milligrams (mg) of sodium each day. If you have high blood pressure (hypertension), you may need to limit your sodium to 1,500 mg each day. Follow the tips below to help reduce your sodium intake. What are tips for eating less sodium? Reading food labels  Check the food label before buying or using packaged ingredients. Always check the label for the serving size and sodium content. Look for products with no more than 140 mg of sodium in one serving. Check the % Daily Value column to see what percent of the daily recommended amount of sodium is provided in one serving of the product. Foods with 5% or less in this column are considered low in sodium. Foods with 20% or higher are considered high in sodium. Do not choose foods with salt as one of the first three ingredients on the ingredients list. If salt is one of the first three ingredients, it usually means the item is high in sodium. Shopping Buy sodium-free or low-sodium products. Look for the following words on food labels: Low-sodium. Sodium-free. Reduced-sodium. No salt added. Unsalted. Always check the sodium content even if foods are labeled as low-sodium or no salt added. Buy fresh foods. Cooking Use herbs, seasonings without salt, and spices as substitutes for salt. Use sodium-free baking soda when baking. Grill, braise, or roast foods to add flavor with less salt. Avoid adding salt to pasta, rice, or hot cereals. Drain and rinse canned vegetables, beans, and meat before use. Avoid adding salt when cooking sweets and desserts. Cook with  low-sodium ingredients. What foods are high in sodium? Vegetables Regular canned vegetables (not low-sodium or reduced-sodium). Sauerkraut, pickled vegetables, and relishes. Olives. [place] fries. Onion rings. Regular canned tomato sauce and paste. Regular tomato and vegetable juice. Frozen vegetables in sauces. Grains Instant hot cereals. Bread stuffing, pancake, and biscuit mixes. Croutons. Seasoned rice or pasta mixes. Noodle soup cups. Boxed or frozen macaroni and cheese. Regular salted crackers. Self-rising flour. Rolls. Bagels. Flour tortillas and wraps. Meats and other proteins Meat or fish that is salted, canned, smoked, cured, spiced, or pickled. This includes bacon, ham, sausages, hot dogs, corned beef, chipped beef, meat loaves, salt pork, jerky, pickled herring, anchovies, regular canned tuna, and sardines. Salted nuts. Dairy Processed cheese and cheese spreads. Cheese curds. Blue cheese. Feta cheese. String cheese. Regular cottage cheese. Buttermilk. Canned milk. The items listed above may not be a complete list of foods high in sodium. Actual amounts of sodium may be different depending on processing. Contact a dietitian for more information. What foods are low in sodium? Fruits Fresh, frozen, or canned fruit with no sauce added. Fruit juice. Vegetables Fresh or frozen vegetables with no sauce added. "No salt added" canned vegetables. "No salt added" tomato sauce and paste. Low-sodium or reduced-sodium tomato and vegetable juice. Grains Noodles, pasta, quinoa, rice. Shredded or puffed wheat or puffed rice. Regular or quick oats (not instant). Low-sodium crackers. Low-sodium bread. Whole-grain bread and whole-grain pasta. Unsalted popcorn. Meats and other proteins Fresh or frozen whole meats, poultry (not injected with sodium), and fish with no sauce added.  Unsalted nuts. Dried peas, beans, and lentils without added salt. Unsalted canned beans. Eggs. Unsalted nut butters. Low-sodium  canned tuna or chicken. Dairy Milk. Soy milk. Yogurt. Low-sodium cheeses, such as Swiss, Monterey Jack, Lindsay, and [employer]. Sherbet or ice cream (keep to  cup per serving). Cream cheese. Fats and oils Unsalted butter or margarine. Other foods Homemade pudding. Sodium-free baking soda and baking powder. Herbs and spices. Low-sodium seasoning mixes. Beverages Coffee and tea. Carbonated beverages. The items listed above may not be a complete list of foods low in sodium. Actual amounts of sodium may be different depending on processing. Contact a dietitian for more information. What are some salt alternatives when cooking? The following are herbs, seasonings, and spices that can be used instead of salt to flavor your food. Herbs should be fresh or dried. Do not choose packaged mixes. Next to the name of the herb, spice, or seasoning are some examples of foods you can pair it with. Herbs [place] leaves - Soups, meat and vegetable dishes, and spaghetti sauce. Basil - [employer], soups, pasta, and fish dishes. Cilantro - Meat, poultry, and vegetable dishes. Chili powder - Marinades and Mexican dishes. Chives - Salad dressings and potato dishes. Cumin - Mexican dishes, couscous, and meat dishes. Dill - Fish dishes, sauces, and salads. Fennel - Meat and vegetable dishes, breads, and cookies. Garlic (do not use garlic salt) - [place] dishes, meat dishes, salad dressings, and sauces. Marjoram - Soups, potato dishes, and meat dishes. Oregano - Pizza and spaghetti sauce. Parsley - Salads, soups, pasta, and meat dishes. Rosemary - [place] dishes, salad dressings, soups, and red meats.

## 2022-09-27 NOTE — Assessment & Plan Note (Signed)
Encourage diet and exercise for weight loss 

## 2022-09-27 NOTE — Progress Notes (Signed)
Subjective:    Patient ID: Gina Wells, female    DOB: 09-04-1960, 62 y.o.   MRN: [id number]  HPI  Patient presents to clinic today for 2-week follow-up of HTN.  At her last visit, her BP was elevated.  She did not want to adjust her Lisinopril HCT at that time.  Her BP today is 168/110.  ECG from 11/2020 reviewed.  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 18 g 0   aspirin EC 81 MG tablet Take 1 tablet (81 mg total) by mouth daily. Swallow whole. 30 tablet 12   Aspirin-Salicylamide-Caffeine (BC HEADACHE POWDER PO) Take by mouth 3 (three) times daily as needed.     Fluticasone-Umeclidin-Vilant (TRELEGY ELLIPTA) 100-62.5-25 MCG/ACT AEPB Inhale 1 puff into the lungs daily. 1 each 11   lisinopril-hydrochlorothiazide (ZESTORETIC) 20-25 MG tablet Take 2 tablets by mouth daily. 180 tablet 1   omeprazole (PRILOSEC) 20 MG capsule Take 1 capsule (20 mg total) by mouth 2 (two) times daily before a meal. (Patient taking differently: Take 20 mg by mouth as needed.) 180 capsule 1   propranolol ER (INDERAL LA) 60 MG 24 hr capsule Take 1 capsule (60 mg total) by mouth daily. 90 capsule 1   simethicone (MYLICON) 0000000 MG chewable tablet Chew 125 mg by mouth every 6 (six) hours as needed for flatulence.     SYNTHROID 200 MCG tablet Take 1 tablet (200 mcg total) by mouth daily before breakfast. 30 tablet 1   venlafaxine XR (EFFEXOR XR) 37.5 MG 24 hr capsule Take 1 capsule (37.5 mg total) by mouth daily with breakfast. 90 capsule 0   No current  facility-administered medications for this visit.    No Known Allergies  Family History  Problem Relation Age of Onset   Diabetes Mother    Alcohol abuse Father    Diabetes Father    Cancer Father    Cancer Paternal Aunt    Alcohol abuse Brother     Social History   Socioeconomic History   Marital status: Single    Spouse name: Not on file   Number of children: Not on file   Years of education: Not on file   Highest education level: Not on file  Occupational History   Not on file  Tobacco Use   Smoking status: Every Day    Packs/day: 0.25    Years: 50.00    Total pack years: 12.50    Types: Cigarettes    Start date: 07/25/1975   Smokeless tobacco: Never  Vaping Use   Vaping Use: Never used  Substance and Sexual Activity   Alcohol use: No    Alcohol/week: 0.0 standard drinks of alcohol   Drug use: No   Sexual activity: Yes  Other Topics Concern   Not on file  Social History Narrative   Not on file   Social Determinants of Health   Financial Resource Strain: Not on file  Food Insecurity: Not  on file  Transportation Needs: Not on file  Physical Activity: Not on file  Stress: Not on file  Social Connections: Not on file  Intimate Partner Violence: Not on file     Constitutional: Patient reports fatigue and weight gain.  Denies fever, malaise, headache.  HEENT: Denies eye pain, eye redness, ear pain, ringing in the ears, wax buildup, runny nose, nasal congestion, bloody nose, or sore throat. Respiratory: Patient reports shortness of breath.  Denies difficulty breathing, cough or sputum production.   Cardiovascular: Patient reports chest tightness and swelling in her hands and feet.  Denies chest pain, palpitations.  Neurological: Patient reports paresthesia of her upper and lower extremities.  Denies dizziness, difficulty with memory, difficulty with speech or problems with balance and coordination.    No other specific complaints in a complete review of  systems (except as listed in HPI above).  Objective:   Physical Exam BP (!) 164/100 (BP Location: Right Arm, Patient Position: Sitting, Cuff Size: Normal)   Pulse 75   Temp (!) 96.4 F (35.8 C) (Temporal)   Wt 203 lb (92.1 kg)   SpO2 100%   BMI 34.84 kg/m   Wt Readings from Last 3 Encounters:  09/13/22 204 lb (92.5 kg)  12/14/21 189 lb (85.7 kg)  08/16/21 187 lb (84.8 kg)    General: Appears her stated age, obese in NAD. HEENT: Head: normal shape and size; Eyes: sclera white, no icterus, conjunctiva pink, PERRLA and EOMs intact;  Cardiovascular: Normal rate and rhythm. S1,S2 noted.  No murmur, rubs or gallops noted. No JVD.  Trace BLE edema.  Pulmonary/Chest: Normal effort and positive vesicular breath sounds. No respiratory distress. No wheezes, rales or ronchi noted.  Musculoskeletal: No difficulty with gait.  Neurological: Alert and oriented. Coordination normal.     BMET    Component Value Date/Time   NA 144 09/13/2022 0829   NA 140 03/08/2017 1909   K 4.2 09/13/2022 0829   CL 108 09/13/2022 0829   CO2 26 09/13/2022 0829   GLUCOSE 108 (H) 09/13/2022 0829   BUN 14 09/13/2022 0829   BUN 10 03/08/2017 1909   CREATININE 0.98 09/13/2022 0829   CALCIUM 9.2 09/13/2022 0829   GFRNONAA 43 (L) 12/21/2020 1732   GFRNONAA 59 (L) 02/02/2020 1142   GFRAA 69 02/02/2020 1142    Lipid Panel     Component Value Date/Time   CHOL 219 (H) 09/13/2022 0829   CHOL 233 (H) 03/08/2017 1909   TRIG 78 09/13/2022 0829   HDL 72 09/13/2022 0829   HDL 62 03/08/2017 1909   CHOLHDL 3.0 09/13/2022 0829   VLDL 9 05/19/2018 1549   LDLCALC 130 (H) 09/13/2022 0829    CBC    Component Value Date/Time   WBC 8.5 09/13/2022 0829   RBC 4.08 09/13/2022 0829   HGB 12.8 09/13/2022 0829   HGB 13.0 03/08/2017 1909   HCT 38.0 09/13/2022 0829   HCT 39.8 03/08/2017 1909   PLT 201 09/13/2022 0829   PLT 271 03/08/2017 1909   MCV 93.1 09/13/2022 0829   MCV 89 03/08/2017 1909   MCH 31.4  09/13/2022 0829   MCHC 33.7 09/13/2022 0829   RDW 12.8 09/13/2022 0829   RDW 14.4 03/08/2017 1909   LYMPHSABS 2,897 10/08/2019 0958   LYMPHSABS 3.2 (H) 01/19/2015 0808   MONOABS 0.7 05/19/2018 1116   EOSABS 61 10/08/2019 0958   EOSABS 0.1 01/19/2015 0808   BASOSABS 61 10/08/2019 0958   BASOSABS 0.0 01/19/2015 [id number]  Hgb A1C Lab Results  Component Value Date   HGBA1C 6.2 (H) 09/13/2022            Assessment & Plan:   RTC in 3 weeks, follow up HTN/hypothyroidism, and 5 months for follow-up of chronic conditions Webb Silversmith, NP

## 2022-09-27 NOTE — Assessment & Plan Note (Signed)
Uncontrolled We will D/C lisinopril HCT Rx for amlodipine-olmesartan 10-40 mg daily Rx for furosemide 20 mg daily Reinforced DASH diet and exercise for weight loss

## 2022-10-03 ENCOUNTER — Ambulatory Visit: Payer: 59

## 2022-10-18 ENCOUNTER — Ambulatory Visit (INDEPENDENT_AMBULATORY_CARE_PROVIDER_SITE_OTHER): Payer: 59 | Admitting: Internal Medicine

## 2022-10-18 ENCOUNTER — Encounter: Payer: Self-pay | Admitting: Internal Medicine

## 2022-10-18 VITALS — BP 156/92 | HR 73 | Temp 96.8°F | Wt 198.0 lb

## 2022-10-18 DIAGNOSIS — I10 Essential (primary) hypertension: Secondary | ICD-10-CM

## 2022-10-18 DIAGNOSIS — E890 Postprocedural hypothyroidism: Secondary | ICD-10-CM | POA: Diagnosis not present

## 2022-10-18 DIAGNOSIS — R609 Edema, unspecified: Secondary | ICD-10-CM

## 2022-10-18 DIAGNOSIS — E6609 Other obesity due to excess calories: Secondary | ICD-10-CM

## 2022-10-18 DIAGNOSIS — Z1211 Encounter for screening for malignant neoplasm of colon: Secondary | ICD-10-CM

## 2022-10-18 DIAGNOSIS — Z6833 Body mass index (BMI) 33.0-33.9, adult: Secondary | ICD-10-CM

## 2022-10-18 MED ORDER — LABETALOL HCL 100 MG PO TABS: 100.0000 mg | ORAL_TABLET | Freq: Two times a day (BID) | ORAL | 0 refills | Status: DC

## 2022-10-18 NOTE — Assessment & Plan Note (Signed)
TSH and free T4 today We will adjust Synthroid if needed based on labs

## 2022-10-18 NOTE — Patient Instructions (Signed)
Edema  Edema is an abnormal buildup of fluids in the body tissues and under the skin. Swelling of the legs, feet, and ankles is a common symptom that becomes more likely as you get older. Swelling is also common in looser tissues, such as around the eyes. Pressing on the area may make a temporary dent in your skin (pitting edema). This fluid may also accumulate in your lungs (pulmonary edema). There are many possible causes of edema. Eating too much salt (sodium) and being on your feet or sitting for a long time can cause edema in your legs, feet, and ankles. Common causes of edema include: Certain medical conditions, such as heart failure, liver or kidney disease, and cancer. Weak leg blood vessels. An injury. Pregnancy. Medicines. Being obese. Low protein levels in the blood. Hot weather may make edema worse. Edema is usually painless. Your skin may look swollen or shiny. Follow these instructions at home: Medicines Take over-the-counter and prescription medicines only as told by your health care provider. Your health care provider may prescribe a medicine to help your body get rid of extra water (diuretic). Take this medicine if you are told to take it. Eating and drinking Eat a low-salt (low-sodium) diet to reduce fluid as told by your health care provider. Sometimes, eating less salt may reduce swelling. Depending on the cause of your swelling, you may need to limit how much fluid you drink (fluid restriction). General instructions Raise (elevate) the injured area above the level of your heart while you are sitting or lying down. Do not sit still or stand for long periods of time. Do not wear tight clothing. Do not wear garters on your upper legs. Exercise your legs to get your circulation going. This helps to move the fluid back into your blood vessels, and it may help the swelling go down. Wear compression stockings as told by your health care provider. These stockings help to prevent  blood clots and reduce swelling in your legs. It is important that these are the correct size. These stockings should be prescribed by your health care provider to prevent possible injuries. If elastic bandages or wraps are recommended, use them as told by your health care provider. Contact a health care provider if: Your edema does not get better with treatment. You have heart, liver, or kidney disease and have symptoms of edema. You have sudden and unexplained weight gain. Get help right away if: You develop shortness of breath or chest pain. You cannot breathe when you lie down. You develop pain, redness, or warmth in the swollen areas. You have heart, liver, or kidney disease and suddenly get edema. You have a fever and your symptoms suddenly get worse. These symptoms may be an emergency. Get help right away. Call 911. Do not wait to see if the symptoms will go away. Do not drive yourself to the hospital. Summary Edema is an abnormal buildup of fluids in the body tissues and under the skin. Eating too much salt (sodium)and being on your feet or sitting for a long time can cause edema in your legs, feet, and ankles. Raise (elevate) the injured area above the level of your heart while you are sitting or lying down. Follow your health care provider's instructions about diet and how much fluid you can drink. This information is not intended to replace advice given to you by your health care provider. Make sure you discuss any questions you have with your health care provider. Document Revised: 03/14/2021 Document   Reviewed: 03/14/2021 Elsevier Patient Education  2023 [employer]  

## 2022-10-18 NOTE — Assessment & Plan Note (Signed)
Remains uncontrolled on amlodipine-olmesartan, propranolol and furosemide Will obtain ultrasound for renal artery stenosis Continue amlodipine-olmesartan BMET today, consider increasing furosemide if her kidney function can tolerate this DC propranolol, Rx for labetalol 100 mg twice daily Reinforced DASH diet and exercise for weight loss

## 2022-10-18 NOTE — Assessment & Plan Note (Signed)
Encourage diet and exercise for weight loss 

## 2022-10-18 NOTE — Progress Notes (Signed)
Subjective:    Patient ID: Gina Wells, female    DOB: 1960/11/21, 62 y.o.   MRN: [id number]  HPI  Patient presents to clinic today for follow-up of HTN and hypothyroidism.  HTN: At her last visit, her Lisinopril HCT was discontinued and she was started on Amlodipine-Olmesartan 10-40 mg daily as well as Furosemide 20 mg daily.  She has been taking the medications as prescribed.  Her BP today is 162/94.  She continues to have concerns about fluid retention.  She has chronic shortness of breath but attributes this to her COPD.  An echocardiogram was ordered however she had to cancel this for financial reasons.  ECG from 11/2020 reviewed.  Hypothyroidism: Her last TSH was 109.66, 09/2022.  Her Levothyroxine was changed to Synthroid and increased to 200 mcg daily.  She has been taking the medication as prescribed.  She is due for repeat TSH and free T4 today.  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 18 g 0   amLODipine-olmesartan (AZOR) 10-40 MG tablet Take 1 tablet by mouth daily. 90 tablet 0   aspirin EC 81 MG tablet Take 1 tablet (81 mg total) by mouth daily. Swallow whole. 30 tablet 12   Aspirin-Salicylamide-Caffeine (BC HEADACHE POWDER PO) Take by mouth 3 (three) times daily as needed.     Fluticasone-Umeclidin-Vilant (TRELEGY ELLIPTA) 100-62.5-25 MCG/ACT AEPB Inhale 1 puff into the lungs daily. 1 each 11   furosemide (LASIX) 20 MG tablet Take 1 tablet (20 mg total) by mouth daily. 90 tablet 0   omeprazole (PRILOSEC) 20 MG  capsule Take 1 capsule (20 mg total) by mouth 2 (two) times daily before a meal. (Patient taking differently: Take 20 mg by mouth as needed.) 180 capsule 1   propranolol ER (INDERAL LA) 60 MG 24 hr capsule Take 1 capsule (60 mg total) by mouth daily. 90 capsule 1   simethicone (MYLICON) 0000000 MG chewable tablet Chew 125 mg by mouth every 6 (six) hours as needed for flatulence.     SYNTHROID 200 MCG tablet Take 1 tablet (200 mcg total) by mouth daily before breakfast. 30 tablet 1   No current facility-administered medications for this visit.    No Known Allergies  Family History  Problem Relation Age of Onset   Diabetes Mother    Alcohol abuse Father    Diabetes Father    Cancer Father    Cancer Paternal Aunt    Alcohol abuse Brother     Social History   Socioeconomic History   Marital status: Single    Spouse name: Not on file   Number of children: Not on file   Years of education: Not on file   Highest education level: Not on file  Occupational History   Not on file  Tobacco Use   Smoking status: Every Day    Packs/day: 0.25    Years: 50.00    Additional pack years: 0.00    Total  pack years: 12.50    Types: Cigarettes    Start date: 07/25/1975   Smokeless tobacco: Never  Vaping Use   Vaping Use: Never used  Substance and Sexual Activity   Alcohol use: No    Alcohol/week: 0.0 standard drinks of alcohol   Drug use: No   Sexual activity: Yes  Other Topics Concern   Not on file  Social History Narrative   Not on file   Social Determinants of Health   Financial Resource Strain: Not on file  Food Insecurity: Not on file  Transportation Needs: Not on file  Physical Activity: Not on file  Stress: Not on file  Social Connections: Not on file  Intimate Partner Violence: Not on file     Constitutional: Patient reports headaches, fatigue.  Denies fever, malaise, or abrupt weight changes.  HEENT: Denies eye pain, eye redness, ear pain, ringing in the ears, wax buildup,  runny nose, nasal congestion, bloody nose, or sore throat. Respiratory: Patient reports shortness of breath.  Denies difficulty breathing, cough or sputum production.   Cardiovascular: Patient reports swelling in hands and feet.  Denies chest pain, chest tightness, palpitations .  Gastrointestinal: Patient reports constipation.  Denies abdominal pain, bloating, diarrhea or blood in the stool.  GU: Denies urgency, frequency, pain with urination, burning sensation, blood in urine, odor or discharge. Musculoskeletal: Denies decrease in range of motion, difficulty with gait, muscle pain or joint pain and swelling.  Skin: Denies redness, rashes, lesions or ulcercations.  Neurological: Denies dizziness, difficulty with memory, difficulty with speech or problems with balance and coordination.  Psych: Patient has a history of anxiety and depression.  Denies SI/HI.  No other specific complaints in a complete review of systems (except as listed in HPI above).  Objective:   Physical Exam  BP (!) 156/92 (BP Location: Right Arm, Patient Position: Sitting, Cuff Size: Large)   Pulse 73   Temp (!) 96.8 F (36 C) (Temporal)   Wt 198 lb (89.8 kg)   SpO2 98%   BMI 33.99 kg/m   Wt Readings from Last 3 Encounters:  09/27/22 203 lb (92.1 kg)  09/13/22 204 lb (92.5 kg)  12/14/21 189 lb (85.7 kg)    General: Appears her stated age, obese, in NAD. Skin: Warm, dry and intact.  HEENT: Head: normal shape and size; Eyes: sclera white, no icterus, conjunctiva pink, PERRLA and EOMs intact;  Neck:  Neck supple, trachea midline. No masses, lumps.  Thyromegaly noted..  Cardiovascular: Normal rate and rhythm. S1,S2 noted.  No murmur, rubs or gallops noted. No JVD.  She does have some fluid retention in her hands.  Trace pitting BLE edema. No carotid bruits noted. Pulmonary/Chest: Normal effort and positive vesicular breath sounds. No respiratory distress. No wheezes, rales or ronchi noted.  Abdomen: Hypoactive  bowel sounds.  Musculoskeletal: No difficulty with gait.  Neurological: Alert and oriented.  Coordination normal.     BMET    Component Value Date/Time   NA 144 09/13/2022 0829   NA 140 03/08/2017 1909   K 4.2 09/13/2022 0829   CL 108 09/13/2022 0829   CO2 26 09/13/2022 0829   GLUCOSE 108 (H) 09/13/2022 0829   BUN 14 09/13/2022 0829   BUN 10 03/08/2017 1909   CREATININE 0.98 09/13/2022 0829   CALCIUM 9.2 09/13/2022 0829   GFRNONAA 43 (L) 12/21/2020 1732   GFRNONAA 59 (L) 02/02/2020 1142   GFRAA 69 02/02/2020 1142    Lipid Panel     Component Value  Date/Time   CHOL 219 (H) 09/13/2022 0829   CHOL 233 (H) 03/08/2017 1909   TRIG 78 09/13/2022 0829   HDL 72 09/13/2022 0829   HDL 62 03/08/2017 1909   CHOLHDL 3.0 09/13/2022 0829   VLDL 9 05/19/2018 1549   LDLCALC 130 (H) 09/13/2022 0829    CBC    Component Value Date/Time   WBC 8.5 09/13/2022 0829   RBC 4.08 09/13/2022 0829   HGB 12.8 09/13/2022 0829   HGB 13.0 03/08/2017 1909   HCT 38.0 09/13/2022 0829   HCT 39.8 03/08/2017 1909   PLT 201 09/13/2022 0829   PLT 271 03/08/2017 1909   MCV 93.1 09/13/2022 0829   MCV 89 03/08/2017 1909   MCH 31.4 09/13/2022 0829   MCHC 33.7 09/13/2022 0829   RDW 12.8 09/13/2022 0829   RDW 14.4 03/08/2017 1909   LYMPHSABS 2,897 10/08/2019 0958   LYMPHSABS 3.2 (H) 01/19/2015 0808   MONOABS 0.7 05/19/2018 1116   EOSABS 61 10/08/2019 0958   EOSABS 0.1 01/19/2015 0808   BASOSABS 61 10/08/2019 0958   BASOSABS 0.0 01/19/2015 0808    Hgb A1C Lab Results  Component Value Date   HGBA1C 6.2 (H) 09/13/2022          Assessment & Plan:   Fluid Retention, SOB:  BMET today, consider increasing furosemide for kidney function can tolerate this BNP today Echocardiogram was ordered but she had canceled this for financial reasons  RTC in 2 weeks follow up HTN, 5 months for follow-up of chronic conditions Webb Silversmith, NP

## 2022-10-19 LAB — BASIC METABOLIC PANEL WITH GFR
BUN: 21 mg/dL (ref 7–25)
CO2: 26 mmol/L (ref 20–32)
Calcium: 9.5 mg/dL (ref 8.6–10.4)
Chloride: 109 mmol/L (ref 98–110)
Creat: 0.78 mg/dL (ref 0.50–1.05)
Glucose, Bld: 104 mg/dL — ABNORMAL HIGH (ref 65–99)
Potassium: 3.8 mmol/L (ref 3.5–5.3)
Sodium: 143 mmol/L (ref 135–146)
eGFR: 86 mL/min/{1.73_m2} (ref >=60)

## 2022-10-19 LAB — BRAIN NATRIURETIC PEPTIDE: Brain Natriuretic Peptide: 31 pg/mL (ref <100)

## 2022-10-19 LAB — TSH: TSH: 2.96 mIU/L (ref 0.40–4.50)

## 2022-10-19 LAB — T4, FREE: Free T4: 1.2 ng/dL (ref 0.8–1.8)

## 2022-11-01 ENCOUNTER — Encounter: Payer: Self-pay | Admitting: Internal Medicine

## 2022-11-01 ENCOUNTER — Ambulatory Visit
Admission: RE | Admit: 2022-11-01 | Discharge: 2022-11-01 | Disposition: A | Discharge status: Home / Self Care | Payer: 59 | Source: Ambulatory Visit | Attending: Internal Medicine | Admitting: Internal Medicine

## 2022-11-01 ENCOUNTER — Ambulatory Visit (INDEPENDENT_AMBULATORY_CARE_PROVIDER_SITE_OTHER): Payer: 59 | Admitting: Internal Medicine

## 2022-11-01 VITALS — BP 152/88 | HR 72 | Temp 96.9°F | Wt 201.0 lb

## 2022-11-01 DIAGNOSIS — R202 Paresthesia of skin: Secondary | ICD-10-CM

## 2022-11-01 DIAGNOSIS — E6609 Other obesity due to excess calories: Secondary | ICD-10-CM | POA: Diagnosis not present

## 2022-11-01 DIAGNOSIS — Z6834 Body mass index (BMI) 34.0-34.9, adult: Secondary | ICD-10-CM

## 2022-11-01 DIAGNOSIS — M542 Cervicalgia: Secondary | ICD-10-CM

## 2022-11-01 DIAGNOSIS — M25512 Pain in left shoulder: Secondary | ICD-10-CM | POA: Diagnosis not present

## 2022-11-01 DIAGNOSIS — I10 Essential (primary) hypertension: Secondary | ICD-10-CM

## 2022-11-01 MED ORDER — LABETALOL HCL 100 MG PO TABS: 100.0000 mg | ORAL_TABLET | Freq: Three times a day (TID) | ORAL | 0 refills | Status: DC

## 2022-11-01 MED ORDER — PREDNISONE 10 MG PO TABS: ORAL_TABLET | ORAL | 0 refills | Status: DC

## 2022-11-01 MED ORDER — METHOCARBAMOL 500 MG PO TABS: 500.0000 mg | ORAL_TABLET | Freq: Four times a day (QID) | ORAL | 0 refills | Status: DC

## 2022-11-01 NOTE — Progress Notes (Signed)
Subjective:    Patient ID: Gina Wells, female    DOB: [birth year], 62 y.o.   MRN: [id number]  HPI  Patient presents to clinic today for 2-week follow-up of HTN.  At her last visit, her BP remained elevated despite Amlodipine-Olmesartan, Furosemide and Propranolol.  She was continued on Amlodipine-Olmesartan, her Furosemide was increased and Propranolol was changed to Labetalol.  She has been taking the medication as prescribed but admits that she was taking the Propranolol and Labetalol together.  Renal artery ultrasound was ordered for further evaluation and is scheduled for today however she is not going to be able to have this done for financial reasons.  Her BP today is 156/86.  ECG from 11/2020 reviewed.  She also reports left-sided neck, left shoulder and left arm pain, numbness and tingling.  She reports this started a few weeks ago.  She denies weakness.  She denies any injury to the area.  She has tried anti-inflammatories OTC with minimal relief of symptoms.  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 18 g 0   amLODipine-olmesartan (AZOR) 10-40 MG tablet Take 1 tablet by mouth daily. 90 tablet 0   aspirin EC 81 MG tablet Take 1 tablet (81 mg total) by mouth daily. Swallow whole. 30 tablet 12   Aspirin-Salicylamide-Caffeine (BC HEADACHE POWDER PO) Take by mouth 3 (three) times daily as needed.     Budeson-Glycopyrrol-Formoterol (BREZTRI AEROSPHERE) 160-9-4.8 MCG/ACT AERO Inhale into the lungs.     furosemide (LASIX) 20 MG  tablet Take 1 tablet (20 mg total) by mouth daily. 90 tablet 0   labetalol (NORMODYNE) 100 MG tablet Take 1 tablet (100 mg total) by mouth 2 (two) times daily. 180 tablet 0   omeprazole (PRILOSEC) 20 MG capsule Take 1 capsule (20 mg total) by mouth 2 (two) times daily before a meal. (Patient taking differently: Take 20 mg by mouth as needed.) 180 capsule 1   simethicone (MYLICON) 125 MG chewable tablet Chew 125 mg by mouth every 6 (six) hours as needed for flatulence.     SYNTHROID 200 MCG tablet Take 1 tablet (200 mcg total) by mouth daily before breakfast. 30 tablet 1   No current facility-administered medications for this visit.    No Known Allergies  Family History  Problem Relation Age of Onset   Diabetes Mother    Alcohol abuse Father    Diabetes Father    Cancer Father    Cancer Paternal Aunt    Alcohol abuse Brother     Social History   Socioeconomic History   Marital status: Single    Spouse name: Not on file   Number of children: Not on file   Years of education: Not on file   Highest education level: Not on file  Occupational History   Not on file  Tobacco Use   Smoking status: Every Day    Packs/day: 0.25    Years: 50.00    Additional pack years: 0.00  Total pack years: 12.50    Types: Cigarettes    Start date: 07/25/1975   Smokeless tobacco: Never  Vaping Use   Vaping Use: Never used  Substance and Sexual Activity   Alcohol use: No    Alcohol/week: 0.0 standard drinks of alcohol   Drug use: No   Sexual activity: Yes  Other Topics Concern   Not on file  Social History Narrative   Not on file   Social Determinants of Health   Financial Resource Strain: Not on file  Food Insecurity: Not on file  Transportation Needs: Not on file  Physical Activity: Not on file  Stress: Not on file  Social Connections: Not on file  Intimate Partner Violence: Not on file     Constitutional: Patient reports intermittent headaches.  Denies fever, malaise, fatigue,  or abrupt weight changes.  Respiratory: Denies difficulty breathing, shortness of breath, cough or sputum production.   Cardiovascular: Denies chest pain, chest tightness, palpitations or swelling in the hands or feet.  Musculoskeletal: Patient reports left-sided neck, left shoulder and left arm pain.  Denies decrease in range of motion, difficulty with gait, muscle pain or joint swelling.  Skin: Denies redness, rashes, lesions or ulcercations.  Neurological: Patient reports numbness and tingling of her left upper extremity.  Denies dizziness, difficulty with memory, difficulty with speech or problems with balance and coordination.    No other specific complaints in a complete review of systems (except as listed in HPI above).     Objective:   Physical Exam  BP (!) 152/88 (BP Location: Right Arm, Patient Position: Sitting, Cuff Size: Large)   Pulse 72   Temp (!) 96.9 F (36.1 C) (Temporal)   Wt 201 lb (91.2 kg)   SpO2 99%   BMI 34.50 kg/m   Wt Readings from Last 3 Encounters:  10/18/22 198 lb (89.8 kg)  09/27/22 203 lb (92.1 kg)  09/13/22 204 lb (92.5 kg)    General: Appears her stated age, obese in NAD. Skin: Warm, dry and intact.  HEENT: Head: normal shape and size; Eyes: sclera white, no icterus, conjunctiva pink, PERRLA and EOMs intact;  Cardiovascular: Normal rate and rhythm. S1,S2 noted.  No murmur, rubs or gallops noted. No JVD or BLE edema.  Radial pulse 2+ on the left. Pulmonary/Chest: Normal effort and positive vesicular breath sounds. No respiratory distress. No wheezes, rales or ronchi noted.  Musculoskeletal: Normal flexion, extension, rotation and lateral bending of the cervical spine.  Bony tenderness noted over the left cervical spine.  Normal internal and external rotation of the left shoulder.  Negative drop can test on the left.  Pain with palpation over the left AC joint.  Shoulder shrug equal. Neurological: Alert and oriented.  Coordination normal.      BMET    Component Value Date/Time   NA 143 10/18/2022 0842   NA 140 03/08/2017 1909   K 3.8 10/18/2022 0842   CL 109 10/18/2022 0842   CO2 26 10/18/2022 0842   GLUCOSE 104 (H) 10/18/2022 0842   BUN 21 10/18/2022 0842   BUN 10 03/08/2017 1909   CREATININE 0.78 10/18/2022 0842   CALCIUM 9.5 10/18/2022 0842   GFRNONAA 43 (L) 12/21/2020 1732   GFRNONAA 59 (L) 02/02/2020 1142   GFRAA 69 02/02/2020 1142    Lipid Panel     Component Value Date/Time   CHOL 219 (H) 09/13/2022 0829   CHOL 233 (H) 03/08/2017 1909   TRIG 78 09/13/2022 0829   HDL 72 09/13/2022  0829   HDL 62 03/08/2017 1909   CHOLHDL 3.0 09/13/2022 0829   VLDL 9 05/19/2018 1549   LDLCALC 130 (H) 09/13/2022 0829    CBC    Component Value Date/Time   WBC 8.5 09/13/2022 0829   RBC 4.08 09/13/2022 0829   HGB 12.8 09/13/2022 0829   HGB 13.0 03/08/2017 1909   HCT 38.0 09/13/2022 0829   HCT 39.8 03/08/2017 1909   PLT 201 09/13/2022 0829   PLT 271 03/08/2017 1909   MCV 93.1 09/13/2022 0829   MCV 89 03/08/2017 1909   MCH 31.4 09/13/2022 0829   MCHC 33.7 09/13/2022 0829   RDW 12.8 09/13/2022 0829   RDW 14.4 03/08/2017 1909   LYMPHSABS 2,897 10/08/2019 0958   LYMPHSABS 3.2 (H) 01/19/2015 0808   MONOABS 0.7 05/19/2018 1116   EOSABS 61 10/08/2019 0958   EOSABS 0.1 01/19/2015 0808   BASOSABS 61 10/08/2019 0958   BASOSABS 0.0 01/19/2015 0808    Hgb A1C Lab Results  Component Value Date   HGBA1C 6.2 (H) 09/13/2022            Assessment & Plan:   Left Side Neck Pain, Left Shoulder Pain, Pain and Paresthesia of LUE:  Concern for cervical radiculitis or shoulder impingement Rx for Pred taper x 9 days Rx from Methocarbamol 500 mg every 8 hours as needed-sedation caution given If no improvement in symptoms, will consider x-ray cervical spine and left shoulder  RTC in 4 months for follow-up of chronic conditions Nicki Reaperegina Keyvon Herter, NP

## 2022-11-01 NOTE — Assessment & Plan Note (Signed)
Encourage diet and exercise for weight loss 

## 2022-11-01 NOTE — Assessment & Plan Note (Signed)
Remains uncontrolled Continue amlodipine-olmesartan Advised her to stop propranolol Continue furosemide Increase labetalol to 3 times daily Reinforced DASH diet and exercise for weight loss She is unable to get renal artery ultrasound for financial reasons

## 2022-11-01 NOTE — Patient Instructions (Signed)
Cervical Radiculopathy  Cervical radiculopathy means that a nerve in the neck (a cervical nerve) is pinched or bruised. This can happen because of an injury to the cervical spine (vertebrae) in the neck, or as a normal part of getting older. This condition can cause pain or loss of feeling (numbness) that runs from your neck all the way down to your arm and fingers. Often, this condition gets better with rest. Treatment may be needed if the condition does not get better. What are the causes? A neck injury. A bulging disk in your spine. Sudden muscle tightening (muscle spasms). Tight muscles in your neck due to overuse. Arthritis. Breakdown in the bones and joints of the spine (spondylosis) due to getting older. Bone spurs that form near the nerves in the neck. What are the signs or symptoms? Pain. The pain may: Run from the neck to the arm and hand. Be very bad or irritating. Get worse when you move your neck. Loss of feeling or tingling in your arm or hand. Weakness in your arm or hand, in very bad cases. How is this treated? In many cases, treatment is not needed for this condition. With rest, the condition often gets better over time. If treatment is needed, options may include: Wearing a soft neck collar (cervical collar) for short periods of time. Doing exercises (physical therapy) to strengthen your neck muscles. Taking medicines. Having shots (injections) in your spine, in very bad cases. Having surgery. This may be needed if other treatments do not help. The type of surgery that is used will depend on the cause of your condition. Follow these instructions at home: If you have a soft neck collar: Wear it as told by your doctor. Take it off only as told by your doctor. Ask your doctor if you can take the collar off for cleaning and bathing. If you are allowed to take the collar off for cleaning or bathing: Follow instructions from your doctor about how to take off the collar  safely. Clean the collar by wiping it with mild soap and water and drying it completely. Take out any removable pads in the collar every 1-2 days. Wash them by hand with soap and water. Let them air-dry completely before you put them back in the collar. Check your skin under the collar for redness or sores. If you see any, tell your doctor. Managing pain     Take over-the-counter and prescription medicines only as told by your doctor. If told, put ice on the painful area. To do this: If you have a soft neck collar, take if off as told by your doctor. Put ice in a plastic bag. Place a towel between your skin and the bag. Leave the ice on for 20 minutes, 2-3 times a day. Take off the ice if your skin turns bright red. This is very important. If you cannot feel pain, heat, or cold, you have a greater risk of damage to the area. If using ice does not help, you can try using heat. Use the heat source that your doctor recommends, such as a moist heat pack or a heating pad. Place a towel between your skin and the heat source. Leave the heat on for 20-30 minutes. Take off the heat if your skin turns bright red. This is very important. If you cannot feel pain, heat, or cold, you have a greater risk of getting burned. You may try a gentle neck and shoulder rub (massage). Activity Rest as needed. Return   to your normal activities when your doctor says that it is safe. Do exercises as told by your doctor or physical therapist. You may have to avoid lifting. Ask your doctor how much you can safely lift. General instructions Use a flat pillow when you sleep. Do not drive while wearing a soft neck collar. If you do not have a soft neck collar, ask your doctor if it is safe to drive while your neck heals. Ask your doctor if you should avoid driving or using machines while you are taking your medicine. Do not smoke or use any products that contain nicotine or tobacco. If you need help quitting, ask your  doctor. Keep all follow-up visits. Contact a doctor if: Your condition does not get better with treatment. Get help right away if: Your pain gets worse and medicine does not help. You lose feeling or feel weak in your hand, arm, face, or leg. You have a high fever. Your neck is stiff. You cannot control when you poop or pee (have incontinence). You have trouble with walking, balance, or talking. Summary Cervical radiculopathy means that a nerve in the neck is pinched or bruised. A nerve can get pinched from a bulging disk, arthritis, an injury to the neck, or other causes. Symptoms include pain, tingling, or loss of feeling that goes from the neck to the arm or hand. Weakness in your arm or hand can happen in very bad cases. Treatment may include resting, wearing a soft neck collar, and doing exercises. You might need to take medicines for pain. In very bad cases, shots or surgery may be needed. This information is not intended to replace advice given to you by your health care provider. Make sure you discuss any questions you have with your health care provider. Document Revised: 01/13/2021 Document Reviewed: 01/13/2021 Elsevier Patient Education  2023 [employer]  

## 2022-11-11 ENCOUNTER — Emergency Department
Admission: EM | Admit: 2022-11-11 | Discharge: 2022-11-11 | Disposition: A | Discharge status: Home / Self Care | Payer: 59 | Attending: Emergency Medicine | Admitting: Emergency Medicine

## 2022-11-11 ENCOUNTER — Emergency Department: Payer: 59

## 2022-11-11 ENCOUNTER — Encounter: Payer: Self-pay | Admitting: Emergency Medicine

## 2022-11-11 ENCOUNTER — Other Ambulatory Visit: Payer: Self-pay

## 2022-11-11 DIAGNOSIS — I10 Essential (primary) hypertension: Secondary | ICD-10-CM | POA: Diagnosis not present

## 2022-11-11 DIAGNOSIS — J449 Chronic obstructive pulmonary disease, unspecified: Secondary | ICD-10-CM | POA: Insufficient documentation

## 2022-11-11 DIAGNOSIS — R0602 Shortness of breath: Secondary | ICD-10-CM | POA: Diagnosis present

## 2022-11-11 DIAGNOSIS — R600 Localized edema: Secondary | ICD-10-CM | POA: Diagnosis not present

## 2022-11-11 LAB — CBC
HCT: 38.4 % (ref 36.0–46.0)
Hemoglobin: 12.4 g/dL (ref 12.0–15.0)
MCH: 30.6 pg (ref 26.0–34.0)
MCHC: 32.3 g/dL (ref 30.0–36.0)
MCV: 94.8 fL (ref 80.0–100.0)
Platelets: 233 10*3/uL (ref 150–400)
RBC: 4.05 MIL/uL (ref 3.87–5.11)
RDW: 13.8 % (ref 11.5–15.5)
WBC: 9.7 10*3/uL (ref 4.0–10.5)
nRBC: 0 % (ref 0.0–0.2)

## 2022-11-11 LAB — TROPONIN I (HIGH SENSITIVITY)
Troponin I (High Sensitivity): 3 ng/L (ref <18)
Troponin I (High Sensitivity): <2 ng/L (ref <18)

## 2022-11-11 LAB — BASIC METABOLIC PANEL
Anion gap: 7 (ref 5–15)
BUN: 12 mg/dL (ref 8–23)
CO2: 25 mmol/L (ref 22–32)
Calcium: 9.2 mg/dL (ref 8.9–10.3)
Chloride: 108 mmol/L (ref 98–111)
Creatinine, Ser: 0.78 mg/dL (ref 0.44–1.00)
GFR, Estimated: >60 mL/min (ref >60)
Glucose, Bld: 104 mg/dL — ABNORMAL HIGH (ref 70–99)
Potassium: 3.4 mmol/L — ABNORMAL LOW (ref 3.5–5.1)
Sodium: 140 mmol/L (ref 135–145)

## 2022-11-11 LAB — BRAIN NATRIURETIC PEPTIDE: B Natriuretic Peptide: 124.6 pg/mL — ABNORMAL HIGH (ref 0.0–100.0)

## 2022-11-11 LAB — T4, FREE: Free T4: 0.94 ng/dL (ref 0.61–1.12)

## 2022-11-11 LAB — TSH: TSH: 0.728 u[IU]/mL (ref 0.350–4.500)

## 2022-11-11 MED ORDER — TORSEMIDE 20 MG PO TABS: ORAL_TABLET | ORAL | 0 refills | Status: DC

## 2022-11-11 MED ORDER — FUROSEMIDE 10 MG/ML IJ SOLN
40.0000 mg | Freq: Once | INTRAMUSCULAR | Status: AC
  Administered 2022-11-11: 40 mg via INTRAVENOUS
  Filled 2022-11-11: qty 4

## 2022-11-11 MED ORDER — POTASSIUM CHLORIDE CRYS ER 20 MEQ PO TBCR: EXTENDED_RELEASE_TABLET | ORAL | 0 refills | Status: DC

## 2022-11-11 NOTE — Discharge Instructions (Signed)
STOP taking the FUROSEMIDE  Start the Torsemide  Take this twice a day for 2 days, with the potassium  Then go back to once a day  Call your PCP for f/u this week for repeat labs and set up an outpatient echocardiogram

## 2022-11-11 NOTE — ED Provider Notes (Signed)
[facility] Provider Note    Event Date/Time   First MD Initiated Contact with Patient 11/11/22 1818     (approximate)   History   Palpitations   HPI  Gina Wells is a 62 y.o. female  here with SOB. Pt reports that over the past several weeks she has had increasing bilateral LE edema, abd distension, and early satiety with DOE. She has had orthopnea. She has seen her PCP and rxed lasix without significant improvement. She has been scheduled for outpt TTE as well. No CP. No h/o CHF but she does have a h/o HTN. She's noticed her BP has been increasing as well. Of note, she increased her synthroid about 5 weeks ago.       Physical Exam   Triage Vital Signs: ED Triage Vitals  Enc Vitals Group     BP 11/11/22 1639 (!) 152/88     Pulse Rate 11/11/22 1639 76     Resp 11/11/22 1639 16     Temp 11/11/22 1639 97.6 F (36.4 C)     Temp Source 11/11/22 1639 Oral     SpO2 11/11/22 1639 99 %     Weight 11/11/22 1635 201 lb (91.2 kg)     Height 11/11/22 1635  (1.626 m)     Head Circumference --      Peak Flow --      Pain Score 11/11/22 1634 0     Pain Loc --      Pain Edu? --      Excl. in GC? --     Most recent vital signs: Vitals:   11/11/22 2008 11/11/22 2106  BP: (!) 152/85   Pulse: 67   Resp: 19   Temp:  98 F (36.7 C)  SpO2: 100%      General: Awake, no distress.  CV:  Good peripheral perfusion.  Resp:  Normal work of breathing. Lungs clear, mild bilateral rales noted. Abd:  No distention. No tenderness. Other:  1+ pitting edema b/l LE, no asymmetry   ED Results / Procedures / Treatments   Labs (all labs ordered are listed, but only abnormal results are displayed) Labs Reviewed  BASIC METABOLIC PANEL - Abnormal; Notable for the following components:      Result Value   Potassium 3.4 (*)    Glucose, Bld 104 (*)    All other components within normal limits  BRAIN NATRIURETIC PEPTIDE - Abnormal; Notable for the  following components:   B Natriuretic Peptide 124.6 (*)    All other components within normal limits  CBC  TSH  T4, FREE  TROPONIN I (HIGH SENSITIVITY)  TROPONIN I (HIGH SENSITIVITY)     EKG Normal sinus rhythm, VR 79. PR 174, QRS 80, QTc 456. No acute st elevations or depressions. No ischemia or infarct.   RADIOLOGY CXR: Normal   I also independently reviewed and agree with radiologist interpretations.   PROCEDURES:  Critical Care performed: No  .1-3 Lead EKG Interpretation  Performed by: Shaune Pollack, MD Authorized by: Shaune Pollack, MD     Interpretation: normal     ECG rate:  60-80   ECG rate assessment: normal     Rhythm: sinus rhythm     Ectopy: none     Conduction: normal   Comments:     Indication: SOB     MEDICATIONS ORDERED IN ED: Medications  furosemide (LASIX) injection 40 mg (40 mg Intravenous Given 11/11/22 1918)  IMPRESSION / MDM / ASSESSMENT AND PLAN / ED COURSE  I reviewed the triage vital signs and the nursing notes.                              Differential diagnosis includes, but is not limited to, new onset CHF, deconditioning, COPD/asthma, PNA, PE, anemia, ACS  Patient's presentation is most consistent with acute presentation with potential threat to life or bodily function.  The patient is on the cardiac monitor to evaluate for evidence of arrhythmia and/or significant heart rate changes  62 year old female with history of COPD, hypertension, hyperlipidemia, obesity, here with leg edema and shortness of breath.  The patient has been started on Lasix recently but has not been responding well to it.  She is not urinating much.  I suspect she likely has some bowel wall edema contributing to poor absorption.  She appears mildly edematous with elevated BNP.  Troponin negative and EKG is nonischemic.  Do not suspect ACS.  She otherwise appears very well.  She has no signs suggest ischemic component.  Given that she is already  established with a PCP and has been referred for an echo, do not feel she needs admission at this time.  She was given a dose of IV Lasix with excellent output and already feels better in the ED.  Will switch her to torsemide, with potassium supplementation, and close outpatient follow-up.  Return precautions given.   FINAL CLINICAL IMPRESSION(S) / ED DIAGNOSES   Final diagnoses:  Peripheral edema     Rx / DC Orders   ED Discharge Orders          Ordered    torsemide (DEMADEX) 20 MG tablet        11/11/22 2116    potassium chloride SA (KLOR-CON M) 20 MEQ tablet        11/11/22 2116             Note:  This document was prepared using Dragon voice recognition software and may include unintentional dictation errors.   Shaune Pollack, MD 11/11/22 2118

## 2022-11-11 NOTE — ED Triage Notes (Signed)
Pt via POV from home. Pt c/o palpitations, SOB and hypertension. States she also noticed swelling in her legs. Denies hx of CHF. States symptoms have been going for months but today she started palpitation. Pt is A&Ox4 and NAD

## 2022-11-13 ENCOUNTER — Telehealth: Payer: Self-pay

## 2022-11-13 NOTE — Transitions of Care (Post Inpatient/ED Visit) (Unsigned)
   11/13/2022  Name: Gina Wells MRN: [id number] DOB: 1961-06-28  Today's TOC FU Call Status: Today's TOC FU Call Status:: Unsuccessul Call (1st Attempt) Unsuccessful Call (1st Attempt) Date: 11/13/22  Attempted to reach the patient regarding the most recent Inpatient/ED visit.  Follow Up Plan: Additional outreach attempts will be made to reach the patient to complete the Transitions of Care (Post Inpatient/ED visit) call.   Oneal Grout, [facility]) [facility] [phone]

## 2022-11-15 NOTE — Transitions of Care (Post Inpatient/ED Visit) (Signed)
   11/15/2022  Name: LOREY PALLETT MRN: [id number] DOB: Jul 10, 1961  Today's TOC FU Call Status: Today's TOC FU Call Status:: Unsuccessful Call (2nd Attempt) Unsuccessful Call (1st Attempt) Date: 11/13/22 Unsuccessful Call (2nd Attempt) Date: 11/15/22 (Voicemail box is not set up and unable to leave a message)  Attempted to reach the patient regarding the most recent Inpatient/ED visit.  Follow Up Plan: No further outreach attempts will be made at this time. We have been unable to contact the patient.  Oneal Grout, [facility]) [facility] [phone]

## 2022-12-12 ENCOUNTER — Other Ambulatory Visit: Payer: Self-pay | Admitting: Internal Medicine

## 2022-12-13 NOTE — Telephone Encounter (Signed)
Requested Prescriptions  Pending Prescriptions Disp Refills   SYNTHROID 200 MCG tablet [Pharmacy Med Name: Synthroid 200 MCG Oral Tablet] 30 tablet 0    Sig: TAKE 1 TABLET BY MOUTH ONCE DAILY BEFORE BREAKFAST     Endocrinology:  Hypothyroid Agents Passed - 12/12/2022  1:16 PM      Passed - TSH in normal range and within 360 days    TSH  Date Value Ref Range Status  11/11/2022 0.728 0.350 - 4.500 uIU/mL Final    Comment:    Performed by a 3rd Generation assay with a functional sensitivity of <=0.01 uIU/mL. Performed at [facility], [address] [place], [place] [address]   10/18/2022 2.96 0.40 - 4.50 mIU/L Final         Passed - Valid encounter within last 12 months    Recent Outpatient Visits           1 month ago Essential hypertension   [facility] [place], Salvadore Oxford, NP   1 month ago Essential hypertension   [place] [facility] [place], Salvadore Oxford, NP   2 months ago Postablative hypothyroidism   [facility] [place], Salvadore Oxford, NP   3 months ago Encounter for general adult medical examination with abnormal findings   [place] [facility] [place], Salvadore Oxford, NP   12 months ago Encounter for screening mammogram for malignant neoplasm of breast   [place] [place], Salvadore Oxford, NP       Future Appointments             In 2 months Baity, Salvadore Oxford, NP [facility], [facility]

## 2022-12-20 ENCOUNTER — Telehealth: Payer: Self-pay | Admitting: Internal Medicine

## 2022-12-20 NOTE — Telephone Encounter (Signed)
Patient refused to schedule appt for echo

## 2023-01-09 ENCOUNTER — Encounter: Payer: Self-pay | Admitting: Emergency Medicine

## 2023-01-09 ENCOUNTER — Other Ambulatory Visit: Payer: Self-pay

## 2023-01-09 ENCOUNTER — Emergency Department
Admission: EM | Admit: 2023-01-09 | Discharge: 2023-01-10 | Disposition: A | Discharge status: Home / Self Care | Payer: 59 | Attending: Emergency Medicine | Admitting: Emergency Medicine

## 2023-01-09 DIAGNOSIS — K5900 Constipation, unspecified: Secondary | ICD-10-CM | POA: Diagnosis not present

## 2023-01-09 DIAGNOSIS — J449 Chronic obstructive pulmonary disease, unspecified: Secondary | ICD-10-CM | POA: Insufficient documentation

## 2023-01-09 DIAGNOSIS — R1084 Generalized abdominal pain: Secondary | ICD-10-CM | POA: Diagnosis not present

## 2023-01-09 DIAGNOSIS — R1010 Upper abdominal pain, unspecified: Secondary | ICD-10-CM | POA: Diagnosis present

## 2023-01-09 DIAGNOSIS — N390 Urinary tract infection, site not specified: Secondary | ICD-10-CM | POA: Insufficient documentation

## 2023-01-09 DIAGNOSIS — I10 Essential (primary) hypertension: Secondary | ICD-10-CM | POA: Diagnosis not present

## 2023-01-09 DIAGNOSIS — Z79899 Other long term (current) drug therapy: Secondary | ICD-10-CM | POA: Diagnosis not present

## 2023-01-09 MED ORDER — ONDANSETRON HCL 4 MG/2ML IJ SOLN
4.0000 mg | Freq: Once | INTRAMUSCULAR | Status: AC
  Administered 2023-01-10: 4 mg via INTRAVENOUS
  Filled 2023-01-09: qty 2

## 2023-01-09 MED ORDER — MORPHINE SULFATE (PF) 4 MG/ML IV SOLN
4.0000 mg | Freq: Once | INTRAVENOUS | Status: AC
  Administered 2023-01-10: 4 mg via INTRAVENOUS
  Filled 2023-01-09: qty 1

## 2023-01-09 MED ORDER — SODIUM CHLORIDE 0.9 % IV BOLUS
1000.0000 mL | Freq: Once | INTRAVENOUS | Status: AC
  Administered 2023-01-10: 1000 mL via INTRAVENOUS

## 2023-01-09 NOTE — ED Triage Notes (Signed)
Pt presents ambulatory to triage via POV with complaints of bilateral lower abdominal pain x 1 day. No N/V. Pt notes taking BC powder with no relief - describes the pain as "a bone poking out". Endorses excess gas and constipation. A&Ox4 at this time. Denies CP or SOB.

## 2023-01-09 NOTE — ED Provider Notes (Signed)
[facility] Provider Note    Event Date/Time   First MD Initiated Contact with Patient 01/09/23 2335     (approximate)   History   Abdominal Pain   HPI  Gina Wells is a 62 y.o. female who presents to the ED from home with a chief complaint of abdominal pain bloating x 1 day.  Patient has had similar symptoms previously but states she is able to eventually have a bowel movement and her symptoms resolved.  Complains of generalized bloating and upper abdominal pain without fever/chills, chest pain, shortness of breath, nausea, vomiting or dizziness.  Denies urinary symptoms.  Reports excessive gas and constipation.     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      Active Problem List   Patient Active Problem List   Diagnosis Date Noted   COPD (chronic obstructive pulmonary disease) (HCC) 08/02/2021   Migraines 08/02/2021   Prediabetes 08/02/2021   HLD (hyperlipidemia) 08/02/2021   Aortic atherosclerosis (HCC) 08/02/2021   Class 1 obesity due to excess calories with body mass index (BMI) of 34.0 to 34.9 in adult 08/02/2021   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 Authorizing Provider  cephALEXin (KEFLEX) 500 MG capsule Take 1 capsule (500 mg total) by mouth 3 (three) times daily. 01/10/23  Yes  Irean Hong, MD  dicyclomine (BENTYL) 20 MG tablet Take 1 tablet (20 mg total) by mouth every 6 (six) hours. 01/10/23  Yes Irean Hong, MD  lactulose (CHRONULAC) 10 GM/15ML solution Take 30 mLs (20 g total) by mouth daily as needed for mild constipation. 01/10/23  Yes Irean Hong, MD  albuterol (VENTOLIN HFA) 108 (90 Base) MCG/ACT inhaler Inhale 2 puffs into the lungs every 6 (six) hours as needed for wheezing or shortness of breath. 08/02/21   Lorre Munroe, NP  amLODipine-olmesartan (AZOR) 10-40 MG tablet Take 1 tablet by mouth daily. 09/27/22   Lorre Munroe, NP  aspirin EC 81 MG tablet Take 1 tablet (81 mg total) by mouth daily. Swallow whole. 12/14/21   Lorre Munroe, NP  Aspirin-Salicylamide-Caffeine (BC HEADACHE POWDER PO) Take by mouth 3 (three) times daily as needed.    [provider]  Budeson-Glycopyrrol-Formoterol (BREZTRI AEROSPHERE) 160-9-4.8 MCG/ACT AERO Inhale into the lungs.    [provider]  labetalol (NORMODYNE) 100 MG tablet Take 1 tablet (100 mg total) by mouth 3 (three) times daily. 11/01/22   Lorre Munroe, NP  methocarbamol (ROBAXIN) 500 MG tablet Take 1 tablet (500 mg total) by mouth 4 (four) times daily. 11/01/22   Lorre Munroe, NP  omeprazole (PRILOSEC) 20 MG capsule Take 1 capsule (20 mg total) by mouth 2 (two) times daily before a meal. Patient  taking differently: Take 20 mg by mouth as needed. 02/16/20   Malfi, Jodelle Gross, FNP  potassium chloride SA (KLOR-CON M) 20 MEQ tablet Take 1 tablet twice daily for 2 days then once daily while taking Torsemide. 11/11/22   Shaune Pollack, MD  predniSONE (DELTASONE) 10 MG tablet Take 3 tabs on days 1-3, 2 tabs on days 4-6, 1 tab on days 7-9 11/01/22   Lorre Munroe, NP  simethicone (MYLICON) 125 MG chewable tablet Chew 125 mg by mouth every 6 (six) hours as needed for flatulence.    [provider]  SYNTHROID 200 MCG tablet TAKE 1 TABLET BY MOUTH ONCE DAILY BEFORE BREAKFAST 12/13/22   Lorre Munroe,  NP  torsemide (DEMADEX) 20 MG tablet Take 1 tablet twice daily for 2 days then 1 tablet daily. 11/11/22   Shaune Pollack, MD     Allergies  Patient has no known allergies.   Family History   Family History  Problem Relation Age of Onset   Diabetes Mother    Alcohol abuse Father    Diabetes Father    Cancer Father    Cancer Paternal Aunt    Alcohol abuse Brother      Physical Exam  Triage Vital Signs: ED Triage Vitals  Enc Vitals Group     BP 01/09/23 2319 139/70     Pulse Rate 01/09/23 2319 70     Resp 01/09/23 2319 18     Temp 01/09/23 2319 98 F (36.7 C)     Temp Source 01/09/23 2319 Oral     SpO2 01/09/23 2319 100 %     Weight 01/09/23 2318 200 lb (90.7 kg)     Height 01/09/23 2318 5\' 4"  (1.626 m)     Head Circumference --      Peak Flow --      Pain Score 01/09/23 2318 7     Pain Loc --      Pain Edu? --      Excl. in GC? --     Updated Vital Signs: BP 115/70 (BP Location: Right Arm)   Pulse 62   Temp 98 F (36.7 C) (Oral)   Resp 18   Ht 5\' 4"  (1.626 m)   Wt 90.7 kg   SpO2 (!) 89%   BMI 34.33 kg/m    General: Awake, mild distress.  CV:  RRR.  Good peripheral perfusion.  Resp:  Normal effort.  CTAB. Abd:  Mild diffuse tenderness to palpation without rebound or guarding.  Mild distention.  Other:  No truncal vesicles.   ED Results / Procedures / Treatments  Labs (all labs ordered are listed, but only abnormal results are displayed) Labs Reviewed  COMPREHENSIVE METABOLIC PANEL - Abnormal; Notable for the following components:      Result Value   BUN 28 (*)    Creatinine, Ser 1.09 (*)    Calcium 8.5 (*)    AST 14 (*)    GFR, Estimated 58 (*)    All other components within normal limits  URINALYSIS, ROUTINE W REFLEX MICROSCOPIC - Abnormal; Notable for the following components:   Color, Urine YELLOW (*)    APPearance CLOUDY (*)    Specific Gravity, Urine 1.043 (*)    Leukocytes,Ua LARGE (*)    Bacteria, UA RARE (*)    All other components  within normal limits  CBC WITH DIFFERENTIAL/PLATELET  LIPASE, BLOOD     EKG  None   RADIOLOGY I have independently visualized interpreted patient's CT  scan as well as noted the radiology interpretation:  CT abdomen/pelvis: No acute abnormality  Official radiology report(s): CT ABDOMEN PELVIS W CONTRAST  Result Date: 01/10/2023 CLINICAL DATA:  Upper abdominal pain and bloating EXAM: CT ABDOMEN AND PELVIS WITH CONTRAST TECHNIQUE: Multidetector CT imaging of the abdomen and pelvis was performed using the standard protocol following bolus administration of intravenous contrast. RADIATION DOSE REDUCTION: This exam was performed according to the departmental dose-optimization program which includes automated exposure control, adjustment of the mA and/or kV according to patient size and/or use of iterative reconstruction technique. CONTRAST:  OMNIPAQUE IOHEXOL 300 MG/ML  SOLN COMPARISON:  CT abdomen and pelvis 12/21/2020 FINDINGS: Lower chest: No acute abnormality. Hepatobiliary: No focal liver abnormality is seen. No gallstones, gallbladder wall thickening, or biliary dilatation. Pancreas: Unremarkable. No pancreatic ductal dilatation or surrounding inflammatory changes. Spleen: Normal in size without focal abnormality. Adrenals/Urinary Tract: Normal adrenal glands. No urinary calculi or hydronephrosis. Stomach/Bowel: Normal caliber large and small bowel. Colonic diverticulosis without diverticulitis. Normal appendix. Stomach is within normal limits. Vascular/Lymphatic: Aortic atherosclerosis. No enlarged abdominal or pelvic lymph nodes. Reproductive: Status post hysterectomy. No adnexal masses. Other: No free intraperitoneal fluid or air. Musculoskeletal: No acute osseous abnormality. IMPRESSION: 1. No acute abdominopelvic abnormality. 2. Colonic diverticulosis without diverticulitis. Aortic Atherosclerosis (ICD10-I70.0). Electronically Signed   By: Minerva Fester M.D.   On: 01/10/2023 01:17      PROCEDURES:  Critical Care performed: No  .1-3 Lead EKG Interpretation  Performed by: Irean Hong, MD Authorized by: Irean Hong, MD     Interpretation: normal     ECG rate:  70   ECG rate assessment: normal     Rhythm: sinus rhythm     Ectopy: none     Conduction: normal   Comments:     Patient placed on cardiac monitor to evaluate for arrhythmias    MEDICATIONS ORDERED IN ED: Medications  sodium chloride 0.9 % bolus 1,000 mL (0 mLs Intravenous Stopped 01/10/23 0157)  morphine (PF) 4 MG/ML injection 4 mg (4 mg Intravenous Given 01/10/23 0025)  ondansetron (ZOFRAN) injection 4 mg (4 mg Intravenous Given 01/10/23 0025)  iohexol (OMNIPAQUE) 300 MG/ML solution 100 mL (100 mLs Intravenous Contrast Given 01/10/23 0053)  ketorolac (TORADOL) 30 MG/ML injection 15 mg (15 mg Intravenous Given 01/10/23 0201)  cefTRIAXone (ROCEPHIN) 1 g in sodium chloride 0.9 % 100 mL IVPB (0 g Intravenous Stopped 01/10/23 0336)  lactulose (CHRONULAC) 10 GM/15ML solution 30 g (30 g Oral Given 01/10/23 0336)     IMPRESSION / MDM / ASSESSMENT AND PLAN / ED COURSE  I reviewed the triage vital signs and the nursing notes.                             62 year old female presenting with abdominal pain and bloating. Differential diagnosis includes, but is not limited to, biliary disease (biliary colic, acute cholecystitis, cholangitis, choledocholithiasis, etc), intrathoracic causes for epigastric abdominal pain including ACS, gastritis, duodenitis, pancreatitis, small bowel or large bowel obstruction, abdominal aortic aneurysm, hernia, and ulcer(s).  Personally reviewed patient's records and note a PCP office visit on 11/01/2022 for follow-up hypertension, obesity, neck pain with paresthesia.  Patient's presentation is most consistent with acute presentation with potential threat to life or bodily function.  The patient is on the cardiac monitor to evaluate for evidence of arrhythmia and/or significant heart  rate changes.  Will obtain lab work, UA, proceed to CT abdomen/pelvis.  Keep NPO, minister IV fluids, IV morphine for pain paired with IV Zofran for nausea.  Will reassess.  Clinical Course as of 01/10/23 0623  Wed Jan 10, 2023  0137 Laboratory results unremarkable with normal WBC and unremarkable electrolytes.  CT does not demonstrate anything acute.  I have personally visualized the CT scan and note moderate stool burden.  Awaiting urine specimen. [JS]  0239 Patient feeling better after IV Toradol.  Will administer IV Rocephin and discharge home on Keflex and as needed Lactulose and Bentyl.  Return precautions given.  Patient verbalizes understanding and agrees with plan of care. [JS]    Clinical Course User Index [JS] Irean Hong, MD     FINAL CLINICAL IMPRESSION(S) / ED DIAGNOSES   Final diagnoses:  Generalized abdominal pain  Constipation, unspecified constipation type  Lower urinary tract infectious disease     Rx / DC Orders   ED Discharge Orders          Ordered    cephALEXin (KEFLEX) 500 MG capsule  3 times daily        01/10/23 0242    lactulose (CHRONULAC) 10 GM/15ML solution  Daily PRN        01/10/23 0242    dicyclomine (BENTYL) 20 MG tablet  Every 6 hours        01/10/23 0242             Note:  This document was prepared using Dragon voice recognition software and may include unintentional dictation errors.   Irean Hong, MD 01/10/23 [phone]

## 2023-01-10 ENCOUNTER — Emergency Department: Payer: 59

## 2023-01-10 LAB — LIPASE, BLOOD: Lipase: 40 U/L (ref 11–51)

## 2023-01-10 LAB — URINALYSIS, ROUTINE W REFLEX MICROSCOPIC
Bilirubin Urine: NEGATIVE
Glucose, UA: NEGATIVE mg/dL
Hgb urine dipstick: NEGATIVE
Ketones, ur: NEGATIVE mg/dL
Nitrite: NEGATIVE
Protein, ur: NEGATIVE mg/dL
Specific Gravity, Urine: 1.043 — ABNORMAL HIGH (ref 1.005–1.030)
WBC, UA: >50 WBC/hpf (ref 0–5)
pH: 6 (ref 5.0–8.0)

## 2023-01-10 LAB — CBC WITH DIFFERENTIAL/PLATELET
Abs Immature Granulocytes: 0.02 10*3/uL (ref 0.00–0.07)
Basophils Absolute: 0.1 10*3/uL (ref 0.0–0.1)
Basophils Relative: 1 %
Eosinophils Absolute: 0.1 10*3/uL (ref 0.0–0.5)
Eosinophils Relative: 1 %
HCT: 41.2 % (ref 36.0–46.0)
Hemoglobin: 13 g/dL (ref 12.0–15.0)
Immature Granulocytes: 0 %
Lymphocytes Relative: 38 %
Lymphs Abs: 3.9 10*3/uL (ref 0.7–4.0)
MCH: 29.6 pg (ref 26.0–34.0)
MCHC: 31.6 g/dL (ref 30.0–36.0)
MCV: 93.8 fL (ref 80.0–100.0)
Monocytes Absolute: 0.7 10*3/uL (ref 0.1–1.0)
Monocytes Relative: 7 %
Neutro Abs: 5.5 10*3/uL (ref 1.7–7.7)
Neutrophils Relative %: 53 %
Platelets: 236 10*3/uL (ref 150–400)
RBC: 4.39 MIL/uL (ref 3.87–5.11)
RDW: 13.7 % (ref 11.5–15.5)
WBC: 10.3 10*3/uL (ref 4.0–10.5)
nRBC: 0 % (ref 0.0–0.2)

## 2023-01-10 LAB — COMPREHENSIVE METABOLIC PANEL
ALT: 14 U/L (ref 0–44)
AST: 14 U/L — ABNORMAL LOW (ref 15–41)
Albumin: 4.3 g/dL (ref 3.5–5.0)
Alkaline Phosphatase: 123 U/L (ref 38–126)
Anion gap: 9 (ref 5–15)
BUN: 28 mg/dL — ABNORMAL HIGH (ref 8–23)
CO2: 26 mmol/L (ref 22–32)
Calcium: 8.5 mg/dL — ABNORMAL LOW (ref 8.9–10.3)
Chloride: 102 mmol/L (ref 98–111)
Creatinine, Ser: 1.09 mg/dL — ABNORMAL HIGH (ref 0.44–1.00)
GFR, Estimated: 58 mL/min — ABNORMAL LOW (ref >60)
Glucose, Bld: 96 mg/dL (ref 70–99)
Potassium: 4.6 mmol/L (ref 3.5–5.1)
Sodium: 137 mmol/L (ref 135–145)
Total Bilirubin: 0.8 mg/dL (ref 0.3–1.2)
Total Protein: 7.6 g/dL (ref 6.5–8.1)

## 2023-01-10 MED ORDER — DICYCLOMINE HCL 20 MG PO TABS: 20.0000 mg | ORAL_TABLET | Freq: Four times a day (QID) | ORAL | 0 refills | Status: DC

## 2023-01-10 MED ORDER — CEPHALEXIN 500 MG PO CAPS: 500.0000 mg | ORAL_CAPSULE | Freq: Three times a day (TID) | ORAL | 0 refills | Status: DC

## 2023-01-10 MED ORDER — KETOROLAC TROMETHAMINE 30 MG/ML IJ SOLN
15.0000 mg | Freq: Once | INTRAMUSCULAR | Status: AC
  Administered 2023-01-10: 15 mg via INTRAVENOUS
  Filled 2023-01-10: qty 1

## 2023-01-10 MED ORDER — LACTULOSE 10 GM/15ML PO SOLN
30.0000 g | Freq: Once | ORAL | Status: AC
  Administered 2023-01-10: 30 g via ORAL
  Filled 2023-01-10: qty 60

## 2023-01-10 MED ORDER — LACTULOSE 10 GM/15ML PO SOLN: 20.0000 g | Freq: Every day | ORAL | 0 refills | Status: DC | PRN

## 2023-01-10 MED ORDER — SODIUM CHLORIDE 0.9 % IV SOLN
1.0000 g | Freq: Once | INTRAVENOUS | Status: AC
  Administered 2023-01-10: 1 g via INTRAVENOUS
  Filled 2023-01-10: qty 10

## 2023-01-10 MED ORDER — IOHEXOL 300 MG/ML  SOLN
100.0000 mL | Freq: Once | INTRAMUSCULAR | Status: AC | PRN
  Administered 2023-01-10: 100 mL via INTRAVENOUS

## 2023-01-10 NOTE — Discharge Instructions (Addendum)
1.  Take and finish antibiotic as prescribed (Keflex 500mg  3 times daily x 7 days). 2.  You may take Bentyl as needed for abdominal discomfort. 3.  You may take Lactulose as needed for bowel movements. 4.  I recommend the following over-the-counter medications to regulate your daily bowel movements: MiraLAX Stool softener such as Colace Increase fiber Increase fluids 5.  Return to the ER for worsening symptoms, persistent vomiting, difficulty breathing or other concerns.

## 2023-01-11 ENCOUNTER — Telehealth: Payer: Self-pay

## 2023-01-11 NOTE — Transitions of Care (Post Inpatient/ED Visit) (Signed)
   01/11/2023  Name: Gina Wells MRN: [id number] DOB: April 26, 1961  Today's TOC FU Call Status: Unsuccessful Call (1st Attempt) Date: 01/11/23  Attempted to reach the patient regarding the most recent Inpatient/ED visit.  Follow Up Plan: Additional outreach attempts will be made to reach the patient to complete the Transitions of Care (Post Inpatient/ED visit) call.   Oneal Grout, [facility]) [facility] [phone]

## 2023-01-15 ENCOUNTER — Encounter: Payer: Self-pay | Admitting: Internal Medicine

## 2023-01-15 ENCOUNTER — Ambulatory Visit (INDEPENDENT_AMBULATORY_CARE_PROVIDER_SITE_OTHER): Payer: 59 | Admitting: Internal Medicine

## 2023-01-15 VITALS — BP 139/76 | HR 76 | Temp 98.2°F | Resp 18 | Ht 64.0 in | Wt 199.0 lb

## 2023-01-15 DIAGNOSIS — F419 Anxiety disorder, unspecified: Secondary | ICD-10-CM

## 2023-01-15 DIAGNOSIS — F32A Depression, unspecified: Secondary | ICD-10-CM

## 2023-01-15 DIAGNOSIS — R140 Abdominal distension (gaseous): Secondary | ICD-10-CM | POA: Diagnosis not present

## 2023-01-15 DIAGNOSIS — Z0289 Encounter for other administrative examinations: Secondary | ICD-10-CM

## 2023-01-15 DIAGNOSIS — R1012 Left upper quadrant pain: Secondary | ICD-10-CM | POA: Diagnosis not present

## 2023-01-15 DIAGNOSIS — N3000 Acute cystitis without hematuria: Secondary | ICD-10-CM

## 2023-01-15 DIAGNOSIS — K5900 Constipation, unspecified: Secondary | ICD-10-CM

## 2023-01-15 MED ORDER — VENLAFAXINE HCL ER 37.5 MG PO CP24: 37.5000 mg | ORAL_CAPSULE | Freq: Every day | ORAL | 0 refills | Status: DC

## 2023-01-15 MED ORDER — LINACLOTIDE 72 MCG PO CAPS: 72.0000 ug | ORAL_CAPSULE | Freq: Every day | ORAL | 0 refills | Status: AC | PRN

## 2023-01-15 NOTE — Patient Instructions (Signed)

## 2023-01-15 NOTE — Assessment & Plan Note (Signed)
Deteriorated due to chronic health conditions Will restart venlafaxine 37.5 mg daily

## 2023-01-15 NOTE — Progress Notes (Signed)
Subjective:    Patient ID: Gina Wells, female    DOB: 1960/10/19, 62 y.o.   MRN: [id number]  HPI  Patient presents to clinic today for ER follow-up.  She presented to the ER 6/18 with complaint of abdominal pain and bloating.  ECG did not reveal any findings.  Labs were concerning for urinary tract infection.  CT abdomen/pelvis showed:  IMPRESSION: 1. No acute abdominopelvic abnormality. 2. Colonic diverticulosis without diverticulitis. Aortic Atherosclerosis (ICD10-I70.0).  She was treated with IV Toradol, IV Rocephin, Zofran, morphine and IV fluids.  She was diagnosed with constipation and a UTI and given a Rx for keflex, lactulose and bentyl.  Since that time, she reports she has persistent LUQ pain. She describes the pain as constant stabbing pain. The pain does not radiate. Laying down makes the pain worse. She has 3-4 small BM's daily but does not feel like she is completely emptying. She has tried fiber and probiotic OTC with minimal relief of symptoms. She denies urinary symptoms.   She would also like to get started on medication for her mood. She has found that she is more irritable, anxious, and feeling down and depressed. She has been on escitalopram and venlafaxine in the past. She is not currently seeing a therapist. She denies SI/HI.  She would also FMLA form completion. She has been missing work due to her chronic conditions. She is requesting 2 8 hour days per month.  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 18  g 0   amLODipine-olmesartan (AZOR) 10-40 MG tablet Take 1 tablet by mouth daily. 90 tablet 0   aspirin EC 81 MG tablet Take 1 tablet (81 mg total) by mouth daily. Swallow whole. 30 tablet 12   Aspirin-Salicylamide-Caffeine (BC HEADACHE POWDER PO) Take by mouth 3 (three) times daily as needed.     Budeson-Glycopyrrol-Formoterol (BREZTRI AEROSPHERE) 160-9-4.8 MCG/ACT AERO Inhale into the lungs.     cephALEXin (KEFLEX) 500 MG capsule Take 1 capsule (500 mg total) by mouth 3 (three) times daily. 21 capsule 0   dicyclomine (BENTYL) 20 MG tablet Take 1 tablet (20 mg total) by mouth every 6 (six) hours. 20 tablet 0   labetalol (NORMODYNE) 100 MG tablet Take 1 tablet (100 mg total) by mouth 3 (three) times daily. 270 tablet 0   lactulose (CHRONULAC) 10 GM/15ML solution Take 30 mLs (20 g total) by mouth daily as needed for mild constipation. 120 mL 0   methocarbamol (ROBAXIN) 500 MG tablet Take 1 tablet (500 mg total) by mouth 4 (four) times daily. 30 tablet 0   omeprazole (PRILOSEC) 20 MG capsule Take 1 capsule (20 mg total) by mouth 2 (two) times daily before a meal. (Patient taking differently: Take 20 mg by mouth as needed.) 180 capsule 1   potassium chloride SA (KLOR-CON M) 20 MEQ tablet Take 1 tablet twice daily for 2 days then once daily while taking Torsemide. 30 tablet 0   predniSONE (DELTASONE) 10  MG tablet Take 3 tabs on days 1-3, 2 tabs on days 4-6, 1 tab on days 7-9 18 tablet 0   simethicone (MYLICON) 125 MG chewable tablet Chew 125 mg by mouth every 6 (six) hours as needed for flatulence.     SYNTHROID 200 MCG tablet TAKE 1 TABLET BY MOUTH ONCE DAILY BEFORE BREAKFAST 30 tablet 0   torsemide (DEMADEX) 20 MG tablet Take 1 tablet twice daily for 2 days then 1 tablet daily. 30 tablet 0   No current facility-administered medications for this visit.    No Known Allergies  Family History  Problem Relation Age of Onset   Diabetes Mother    Alcohol abuse Father    Diabetes Father    Cancer  Father    Cancer Paternal Aunt    Alcohol abuse Brother     Social History   Socioeconomic History   Marital status: Single    Spouse name: Not on file   Number of children: Not on file   Years of education: Not on file   Highest education level: Not on file  Occupational History   Not on file  Tobacco Use   Smoking status: Every Day    Packs/day: 0.25    Years: 50.00    Additional pack years: 0.00    Total pack years: 12.50    Types: Cigarettes    Start date: 07/25/1975   Smokeless tobacco: Never  Vaping Use   Vaping Use: Never used  Substance and Sexual Activity   Alcohol use: No    Alcohol/week: 0.0 standard drinks of alcohol   Drug use: No   Sexual activity: Yes  Other Topics Concern   Not on file  Social History Narrative   Not on file   Social Determinants of Health   Financial Resource Strain: Not on file  Food Insecurity: Not on file  Transportation Needs: Not on file  Physical Activity: Not on file  Stress: Not on file  Social Connections: Not on file  Intimate Partner Violence: Not on file     Constitutional: Patient reports intermittent headaches.  Denies fever, malaise, fatigue, or abrupt weight changes.  HEENT: Denies eye pain, eye redness, ear pain, ringing in the ears, wax buildup, runny nose, nasal congestion, bloody nose, or sore throat. Respiratory: Denies difficulty breathing, shortness of breath, cough or sputum production.   Cardiovascular: Denies chest pain, chest tightness, palpitations or swelling in the hands or feet.  Gastrointestinal: Patient reports LUQ abdominal pain, bloating and constipation.  Denies diarrhea or blood in the stool.  GU: Denies urgency, frequency, pain with urination, burning sensation, blood in urine, odor or discharge. Musculoskeletal: Denies decrease in range of motion, difficulty with gait, muscle pain or joint pain and swelling.  Skin: Denies redness, rashes, lesions or ulcercations.  Neurological: Denies  dizziness, difficulty with memory, difficulty with speech or problems with balance and coordination.  Psych: Patient has a history of anxiety and depression.  Denies SI/HI.  No other specific complaints in a complete review of systems (except as listed in HPI above).     Objective:   Physical Exam  BP 139/76   Pulse 76   Temp 98.2 F (36.8 C) (Oral)   Resp 18   Ht 5\' 4"  (1.626 m)   Wt 199 lb (90.3 kg)   SpO2 98%   BMI 34.16 kg/m   Wt Readings from Last 3 Encounters:  01/09/23 200 lb (90.7 kg)  11/11/22 201 lb (91.2 kg)  11/01/22 201  lb (91.2 kg)    General: Appears her stated age, obese, in NAD. HEENT: Head: normal shape and size; Eyes: sclera white, no icterus, conjunctiva pink, PERRLA and EOMs intact;  Neck:  Neck supple, trachea midline. No masses, lumps present.  Cardiovascular: Normal rate and rhythm. S1,S2 noted.  No murmur, rubs or gallops noted. No JVD or BLE edema. No carotid bruits noted. Pulmonary/Chest: Normal effort and positive vesicular breath sounds. No respiratory distress. No wheezes, rales or ronchi noted.  Abdomen: Slightly distended but soft and tender in the LUQ and LLQ. Normal bowel sounds. No masses noted. Liver, spleen and kidneys non palpable. Musculoskeletal:  No difficulty with gait.  Neurological: Alert and oriented. Coordination normal.  Psychiatric: Mood and affect flat. Behavior is normal. Judgment and thought content normal.    BMET    Component Value Date/Time   NA 137 01/10/2023 0026   NA 140 03/08/2017 1909   K 4.6 01/10/2023 0026   CL 102 01/10/2023 0026   CO2 26 01/10/2023 0026   GLUCOSE 96 01/10/2023 0026   BUN 28 (H) 01/10/2023 0026   BUN 10 03/08/2017 1909   CREATININE 1.09 (H) 01/10/2023 0026   CREATININE 0.78 10/18/2022 0842   CALCIUM 8.5 (L) 01/10/2023 0026   GFRNONAA 58 (L) 01/10/2023 0026   GFRNONAA 59 (L) 02/02/2020 1142   GFRAA 69 02/02/2020 1142    Lipid Panel     Component Value Date/Time   CHOL 219 (H)  09/13/2022 0829   CHOL 233 (H) 03/08/2017 1909   TRIG 78 09/13/2022 0829   HDL 72 09/13/2022 0829   HDL 62 03/08/2017 1909   CHOLHDL 3.0 09/13/2022 0829   VLDL 9 05/19/2018 1549   LDLCALC 130 (H) 09/13/2022 0829    CBC    Component Value Date/Time   WBC 10.3 01/10/2023 0026   RBC 4.39 01/10/2023 0026   HGB 13.0 01/10/2023 0026   HGB 13.0 03/08/2017 1909   HCT 41.2 01/10/2023 0026   HCT 39.8 03/08/2017 1909   PLT 236 01/10/2023 0026   PLT 271 03/08/2017 1909   MCV 93.8 01/10/2023 0026   MCV 89 03/08/2017 1909   MCH 29.6 01/10/2023 0026   MCHC 31.6 01/10/2023 0026   RDW 13.7 01/10/2023 0026   RDW 14.4 03/08/2017 1909   LYMPHSABS 3.9 01/10/2023 0026   LYMPHSABS 3.2 (H) 01/19/2015 0808   MONOABS 0.7 01/10/2023 0026   EOSABS 0.1 01/10/2023 0026   EOSABS 0.1 01/19/2015 0808   BASOSABS 0.1 01/10/2023 0026   BASOSABS 0.0 01/19/2015 0808    Hgb A1C Lab Results  Component Value Date   HGBA1C 6.2 (H) 09/13/2022           Assessment & Plan:   ER follow-up for abdominal pain, bloating, constipation, UTI, encounter for form completion with patient:  ER notes, labs and imaging reviewed She will continue Keflex until course is completed She reports they told her she was constipated but there is no evidence of this on the CT scan report although she was given lactulose and Bentyl Advised her to stop lactulose and Bentyl and will trial Linzess 72 mcg daily Encouraged high-fiber diet and adequate water intake Referral to GI for screening colonoscopy and further evaluation of her LUQ abdominal pain Will fill out FMLA form when she gives this to me  RTC in 2 months for follow-up of chronic conditions Nicki Reaper, NP

## 2023-01-15 NOTE — Transitions of Care (Post Inpatient/ED Visit) (Signed)
01/15/2023  Name: Gina Wells MRN: [id number] DOB: March 29, 1961  Today's TOC FU Call Status: Today's TOC FU Call Status:: Successful TOC FU Call Competed Unsuccessful Call (1st Attempt) Date: 01/11/23 [facility] FU Call Complete Date: 01/15/23  Transition Care Management Follow-up Telephone Call Date of Discharge: 01/10/23 Discharge Facility: [facility]) Type of Discharge: Emergency Department How have you been since you were released from the hospital?: Same  Items Reviewed: Did you receive and understand the discharge instructions provided?: Yes Medications obtained,verified, and reconciled?: Yes (Medications Reviewed) Any new allergies since your discharge?: No Dietary orders reviewed?: NA  Medications Reviewed Today: Medications Reviewed Today     Reviewed by Lorre Munroe, NP (Nurse Practitioner) on 11/01/22 at 0911  Med List Status: <None>   Medication Order Taking? Sig Documenting Provider Last Dose Status Informant  albuterol (VENTOLIN HFA) 108 (90 Base) MCG/ACT inhaler [id number] Yes Inhale 2 puffs into the lungs every 6 (six) hours as needed for wheezing or shortness of breath. Lorre Munroe, NP Taking Active   amLODipine-olmesartan (AZOR) 10-40 MG tablet [id number] Yes Take 1 tablet by mouth daily. Lorre Munroe, NP Taking Active   aspirin EC 81 MG tablet [id number] Yes Take 1 tablet (81 mg total) by mouth daily. Swallow whole. Lorre Munroe, NP Taking Active   Aspirin-Salicylamide-Caffeine [facility] HEADACHE POWDER PO) [id number] Yes Take by mouth 3 (three) times daily as needed. [provider] Taking Active Self  Budeson-Glycopyrrol-Formoterol (BREZTRI AEROSPHERE) 160-9-4.8 MCG/ACT AERO [id number] Yes Inhale into the lungs. [provider] Taking Active   furosemide (LASIX) 20 MG tablet [id number] Yes Take 1 tablet (20 mg total) by mouth daily. Lorre Munroe, NP Taking Active   labetalol (NORMODYNE) 100 MG tablet [id number]   Take 1 tablet (100 mg total) by mouth 3 (three) times daily. Lorre Munroe, NP  Active   methocarbamol (ROBAXIN) 500 MG tablet [id number] Yes Take 1 tablet (500 mg total) by mouth 4 (four) times daily. Lorre Munroe, NP  Active   omeprazole (PRILOSEC) 20 MG capsule [id number] Yes Take 1 capsule (20 mg total) by mouth 2 (two) times daily before a meal.  Patient taking differently: Take 20 mg by mouth as needed.   Malfi, Jodelle Gross, FNP Taking Active Self  predniSONE (DELTASONE) 10 MG tablet [id number] Yes Take 3 tabs on days 1-3, 2 tabs on days 4-6, 1 tab on days 7-9 Baity, Regina W, NP  Active   simethicone (MYLICON) 125 MG chewable tablet [id number] Yes Chew 125 mg by mouth every 6 (six) hours as needed for flatulence. [provider] Taking Active Self  SYNTHROID 200 MCG tablet [id number] Yes Take 1 tablet (200 mcg total) by mouth daily before breakfast. Lorre Munroe, NP Taking Active             Home Care and Equipment/Supplies: Were Home Health Services Ordered?: NA Any new equipment or medical supplies ordered?: NA  Functional Questionnaire: Do you need assistance with bathing/showering or dressing?: No Do you need assistance with meal preparation?: No Do you need assistance with eating?: No Do you have difficulty maintaining continence: No Do you need assistance with getting out of bed/getting out of a chair/moving?: No Do you have difficulty managing or taking your medications?: No  Follow up appointments reviewed: PCP Follow-up appointment confirmed?: Yes Date of PCP follow-up appointment?: 01/17/23 Follow-up Provider: Nicki Reaper, NP Specialist Hospital Follow-up appointment confirmed?: NA Do you need transportation to your follow-up appointment?: No Do  you understand care options if your condition(s) worsen?: Yes-patient verbalized understanding    Oneal Grout, [facility]) Lutricia Horsfall [facility] [phone]

## 2023-01-17 ENCOUNTER — Ambulatory Visit: Payer: 59 | Admitting: Internal Medicine

## 2023-02-22 ENCOUNTER — Telehealth: Payer: Self-pay | Admitting: Physician Assistant

## 2023-02-22 NOTE — Telephone Encounter (Signed)
Called the patient to try to confirm her appointment. Patient didn't answer so I left a vm.

## 2023-02-23 ENCOUNTER — Ambulatory Visit (INDEPENDENT_AMBULATORY_CARE_PROVIDER_SITE_OTHER): Payer: 59 | Admitting: Physician Assistant

## 2023-02-23 ENCOUNTER — Encounter: Payer: Self-pay | Admitting: Physician Assistant

## 2023-02-23 ENCOUNTER — Other Ambulatory Visit: Payer: Self-pay

## 2023-02-23 VITALS — BP 139/90 | HR 75 | Temp 97.8°F | Ht 64.0 in | Wt 195.4 lb

## 2023-02-23 DIAGNOSIS — Z1211 Encounter for screening for malignant neoplasm of colon: Secondary | ICD-10-CM | POA: Diagnosis not present

## 2023-02-23 DIAGNOSIS — R1013 Epigastric pain: Secondary | ICD-10-CM

## 2023-02-23 DIAGNOSIS — K5904 Chronic idiopathic constipation: Secondary | ICD-10-CM

## 2023-02-23 DIAGNOSIS — R1084 Generalized abdominal pain: Secondary | ICD-10-CM

## 2023-02-23 MED ORDER — GOLYTELY 236 G PO SOLR: 4000.0000 mL | Freq: Once | ORAL | 0 refills | Status: AC

## 2023-02-23 NOTE — Progress Notes (Signed)
Celso Amy, PA-C [address]  [address]  [place], [place] [address]  Main: [phone]  Fax: [phone]   Gastroenterology Consultation  Referring Provider:     Lorre Munroe, NP Primary Care Physician:  Lorre Munroe, NP Primary Gastroenterologist:  Celso Amy, PA-C / Dr. Wyline Mood   Reason for Consultation:     Abdominal pain        HPI:   Gina Wells is a 62 y.o. y/o female referred for consultation & management  by Lorre Munroe, NP.  She is here to evaluate generalized abdominal pain and chronic constipation.  She states she has had chronic lifelong constipation.  Symptoms have been worse since 2016.  She feels a lot of abdominal tightness, bloating, and swelling of her abdomen.  Gets full easily.  She has bowel movement once every 4 or 5 days with hard stools and straining.  Has occasional loose stool.  Feels early satiety.  Has had some episodes of solid food dysphagia.  Denies nausea, vomiting, or food bolus episodes.  She has tried Linzess which increased her abdominal pain, unable to tolerate.  She has tried MiraLAX and lactulose with little benefit.  She denies rectal bleeding or unintentional weight loss.  No previous EGD, Colonoscopy, or GI evaluation.  She went to [facility] ED to evaluate generalized abdominal pain 01/09/2023.  Was having abdominal pain and bloating for 1 day.  Relieved after having a bowel movement.  Has had excessive gas and constipation.  Abdominal pelvic CT 01/10/2023 showed no acute abnormality.  Incidental diverticulosis with no diverticulitis.  Previous hysterectomy.  No gallstones.  Lab work unrevealing.  She was treated with lactulose and dicyclomine 20 Mg 3 times daily.  Also cephalexin for UTI.  Takes omeprazole 20 Mg once daily for GERD and Synthroid for hypothyroidism.  Recent TSH normal.   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 Authorizing Provider  albuterol (VENTOLIN HFA) 108 (90 Base) MCG/ACT inhaler Inhale 2 puffs into the lungs every 6 (six) hours as needed for wheezing or shortness of breath. 08/02/21  Yes Baity, Salvadore Oxford, NP  amLODipine-olmesartan (AZOR) 10-40 MG tablet Take 1 tablet by mouth daily. 09/27/22  Yes Baity, Salvadore Oxford, NP  Aspirin-Salicylamide-Caffeine (BC HEADACHE POWDER PO) Take by mouth 3 (three) times daily as needed.   Yes [provider]  Budeson-Glycopyrrol-Formoterol (BREZTRI AEROSPHERE) 160-9-4.8 MCG/ACT AERO Inhale into the lungs.   Yes [provider]  labetalol (NORMODYNE) 100 MG tablet Take 1 tablet (100 mg total) by mouth 3 (three) times daily. 11/01/22  Yes Lorre Munroe, NP  linaclotide (LINZESS) 72 MCG capsule Take 1 capsule (72 mcg total) by mouth daily as needed. 01/15/23  Yes Lorre Munroe, NP  omeprazole (PRILOSEC) 20 MG capsule Take 1 capsule (20 mg total) by mouth 2 (two) times daily before a meal. Patient taking differently: Take 20 mg by  mouth as needed. 02/16/20  Yes Malfi, Jodelle Gross, FNP  potassium chloride SA (KLOR-CON M) 20 MEQ tablet Take 1 tablet twice daily for 2 days then once daily while taking Torsemide. 11/11/22  Yes Shaune Pollack, MD  SYNTHROID 200 MCG tablet TAKE 1 TABLET BY MOUTH ONCE DAILY BEFORE BREAKFAST 12/13/22  Yes Lorre Munroe, NP    Family History  Problem Relation Age of Onset   Diabetes Mother    Alcohol abuse Father    Diabetes Father    Cancer Father    Cancer Paternal Aunt    Alcohol abuse Brother      Social History   Tobacco Use   Smoking status: Every  Day    Current packs/day: 0.25    Average packs/day: 0.3 packs/day for 50.0 years (12.5 ttl pk-yrs)    Types: Cigarettes    Start date: 07/25/1975   Smokeless tobacco: Never  Vaping Use   Vaping status: Never Used  Substance Use Topics   Alcohol use: No    Alcohol/week: 0.0 standard drinks of alcohol   Drug use: No    Allergies as of 02/23/2023   (No Known Allergies)    Review of Systems:    All systems reviewed and negative except where noted in HPI.   Physical Exam:  BP (!) 142/85 (BP Location: Left Arm, Patient Position: Sitting, Cuff Size: Normal)   Pulse 79   Temp 97.8 F (36.6 C) (Oral)   Ht 5\' 4"  (1.626 m)   Wt 195 lb 6 oz (88.6 kg)   BMI 33.54 kg/m  No LMP recorded. Patient has had a hysterectomy. Psych:  Alert and cooperative. Normal mood and affect. General:   Alert,  Well-developed, well-nourished, pleasant and cooperative in NAD Head:  Normocephalic and atraumatic. Eyes:  Sclera clear, no icterus.   Conjunctiva pink. Lungs:  Respirations even and unlabored.  Clear throughout to auscultation.   No wheezes, crackles, or rhonchi. No acute distress. Heart:  Regular rate and rhythm; no murmurs, clicks, rubs, or gallops. Abdomen:  Normal bowel sounds.  No bruits.  Soft, and obese without masses, hepatosplenomegaly or hernias noted.  There is moderate generalized upper abdominal tenderness in the RUQ, epigastrium, and LUQ.  Mild left sided and left lower quadrant tenderness.  No guarding or rebound tenderness.    Neurologic:  Alert and oriented x3;  grossly normal neurologically. Psych:  Alert and cooperative. Normal mood and affect.  Imaging Studies: No results found.  Assessment and Plan:   Gina Wells is a 62 y.o. y/o female has been referred for   1.  Abdominal pain - Epigastric and Generalized -recent abdominal pelvic CT and lab work unrevealing  Scheduling EGD I discussed risks of EGD with patient to include risk of bleeding, perforation, and risk  of sedation.  Patient expressed understanding and agrees to proceed with EGD.   2.  Constipation - Chronic  Stop Linzess - caused abdominal cramping.  Start Trulance 3 mg 1 tablet once daily, samples given.  Add OTC MiraLAX 1 capful once daily if needed.  Continue lactulose daily if needed.  Recommend high-fiber diet and 64 ounces of water daily.  If above treatment does not work, consider Amitiza or Patent attorney.  3.  Colon cancer screening  Scheduling Colonoscopy I discussed risks of colonoscopy with patient to include risk of bleeding, colon perforation, and risk of sedation.  Patient expressed understanding and agrees to proceed with colonoscopy.   2-day prep.  Follow up 4 weeks after  EGD and colonoscopy with TG.  Celso Amy, PA-C

## 2023-02-23 NOTE — Patient Instructions (Signed)
Gave samples of Trulance take 1 tablet every morning let us know how it works and we can call you in a prescription for you.

## 2023-02-28 ENCOUNTER — Other Ambulatory Visit: Payer: Self-pay | Admitting: Internal Medicine

## 2023-02-28 NOTE — Telephone Encounter (Signed)
Medication Refill - Medication:   potassium chloride SA (KLOR-CON M) 20 MEQ tablet  (pt has 2 tablets left)  torsemide 20mg  (pt has 1 tablet left)  Pt states both medications were prescribed to her from the Emergency Room and they are helping her.   Has the patient contacted their pharmacy? Yes.     Preferred Pharmacy (with phone number or street name):  [facility] [address] [place], [place] - [address] Phone: [phone]  Fax: [phone]     Has the patient been seen for an appointment in the last year OR does the patient have an upcoming appointment? Yes.    Agent: Please be advised that RX refills may take up to 3 business days. We ask that you follow-up with your pharmacy.

## 2023-03-01 ENCOUNTER — Other Ambulatory Visit: Payer: Self-pay | Admitting: Internal Medicine

## 2023-03-01 NOTE — Telephone Encounter (Signed)
Medication Refill - Medication: potassium chloride SA (KLOR-CON M) 20 MEQ tablet and torsemide 20mg   Has the patient contacted their pharmacy? No.  Preferred Pharmacy (with phone number or street name):  [facility] [address] [place], [place] - [address] Phone: [phone]  Fax: [phone]     Has the patient been seen for an appointment in the last year OR does the patient have an upcoming appointment? Yes.    Agent: Please be advised that RX refills may take up to 3 business days. We ask that you follow-up with your pharmacy.

## 2023-03-02 ENCOUNTER — Telehealth: Payer: Self-pay | Admitting: Internal Medicine

## 2023-03-02 MED ORDER — TORSEMIDE 20 MG PO TABS: 20.0000 mg | ORAL_TABLET | Freq: Every day | ORAL | 0 refills | Status: DC

## 2023-03-02 MED ORDER — POTASSIUM CHLORIDE CRYS ER 20 MEQ PO TBCR: 20.0000 meq | EXTENDED_RELEASE_TABLET | Freq: Every day | ORAL | 0 refills | Status: DC

## 2023-03-02 NOTE — Telephone Encounter (Signed)
Please review.  I do not see torsemide on her list.   Thanks,   -Vernona Rieger

## 2023-03-02 NOTE — Telephone Encounter (Signed)
Pt called in about status of refill on  potassium chloride SA (KLOR-CON M) 20 MEQ tablet and torsemide 20mg  . I let her know of 48-72 hr turn around and we are working on this.

## 2023-03-02 NOTE — Telephone Encounter (Signed)
Pt states she stopped taking it for a little while and then restarted it a few weeks ago.  She has been out for a few days.  She scheduled a follow up visit for 09/26.  (She wanted to wait until after her colonoscopy.)   Thanks,   -Vernona Rieger

## 2023-03-02 NOTE — Telephone Encounter (Signed)
Requested medication (s) are due for refill today: routing for review  Requested medication (s) are on the active medication list: yes  Last refill:  11/11/22  Future visit scheduled: no  Notes to clinic:  Unable to refill per protocol, last refill by another provider.      Requested Prescriptions  Pending Prescriptions Disp Refills   potassium chloride SA (KLOR-CON M) 20 MEQ tablet 30 tablet 0    Sig: Take 1 tablet twice daily for 2 days then once daily while taking Torsemide.     Endocrinology:  Minerals - Potassium Supplementation Failed - 03/01/2023 12:15 PM      Failed - Cr in normal range and within 360 days    Creat  Date Value Ref Range Status  10/18/2022 0.78 0.50 - 1.05 mg/dL Final   Creatinine, Ser  Date Value Ref Range Status  01/10/2023 1.09 (H) 0.44 - 1.00 mg/dL Final         Passed - K in normal range and within 360 days    Potassium  Date Value Ref Range Status  01/10/2023 4.6 3.5 - 5.1 mmol/L Final         Passed - Valid encounter within last 12 months    Recent Outpatient Visits           1 month ago LUQ abdominal pain   [place] [facility] [place], Salvadore Oxford, NP   4 months ago Essential hypertension   [place] [facility] [place], Salvadore Oxford, NP   4 months ago Essential hypertension   [facility] [place], Salvadore Oxford, NP   5 months ago Postablative hypothyroidism   [place] [facility] [place], Salvadore Oxford, NP   5 months ago Encounter for general adult medical examination with abnormal findings   [place] [facility] [place], Salvadore Oxford, NP               torsemide (DEMADEX) 20 MG tablet 30 tablet 0    Sig: Take 1 tablet twice daily for 2 days then 1 tablet daily.     Cardiovascular:  Diuretics - Loop Failed - 03/01/2023 12:15 PM      Failed - Ca in normal range and within 180 days    Calcium  Date Value Ref Range Status  01/10/2023 8.5 (L) 8.9 - 10.3 mg/dL  Final   Calcium, Ion  Date Value Ref Range Status  05/19/2018 1.30 1.15 - 1.40 mmol/L Final         Failed - Cr in normal range and within 180 days    Creat  Date Value Ref Range Status  10/18/2022 0.78 0.50 - 1.05 mg/dL Final   Creatinine, Ser  Date Value Ref Range Status  01/10/2023 1.09 (H) 0.44 - 1.00 mg/dL Final         Failed - Mg Level in normal range and within 180 days    Magnesium  Date Value Ref Range Status  05/19/2018 2.0 1.7 - 2.4 mg/dL Final    Comment:    Performed at [facility], [address], [place], [place] [address]         Failed - Last BP in normal range    BP Readings from Last 1 Encounters:  02/23/23 (!) 139/90         Passed - K in normal range and within 180 days    Potassium  Date Value Ref Range Status  01/10/2023 4.6 3.5 - 5.1 mmol/L Final         Passed - Na in normal range and within 180 days    Sodium  Date Value Ref Range Status  01/10/2023 137 135 - 145 mmol/L Final  03/08/2017 140 134 - 144 mmol/L Final         Passed - Cl in normal range and within 180 days    Chloride  Date Value Ref Range Status  01/10/2023 102 98 - 111 mmol/L Final         Passed - Valid encounter within last 6 months    Recent Outpatient Visits           1 month ago LUQ abdominal pain   [facility] [place], Salvadore Oxford, NP   4 months ago Essential hypertension   [place], Salvadore Oxford, NP   4 months ago Essential hypertension   [place] [facility] [place], Salvadore Oxford, NP   5 months ago Postablative hypothyroidism   [place] [facility] [place], Salvadore Oxford, NP   5 months ago Encounter for general adult medical examination with abnormal findings   [place] [facility] [place], Salvadore Oxford, NP

## 2023-03-02 NOTE — Telephone Encounter (Signed)
This was given to her during an ER visit April 2024 and she did not follow-up with me as instructed and at some point the medication was discontinued off her list.  When is the last time she took torsemide?

## 2023-03-05 NOTE — Telephone Encounter (Signed)
refilled 

## 2023-03-07 ENCOUNTER — Ambulatory Visit: Payer: 59 | Admitting: Internal Medicine

## 2023-04-04 ENCOUNTER — Encounter: Payer: Self-pay | Admitting: Gastroenterology

## 2023-04-05 ENCOUNTER — Other Ambulatory Visit: Payer: Self-pay | Admitting: Internal Medicine

## 2023-04-06 NOTE — Telephone Encounter (Signed)
Requested Prescriptions  Pending Prescriptions Disp Refills   SYNTHROID 200 MCG tablet [Pharmacy Med Name: Synthroid 200 MCG Oral Tablet] 90 tablet 0    Sig: TAKE 1 TABLET BY MOUTH ONCE DAILY BEFORE BREAKFAST     Endocrinology:  Hypothyroid Agents Passed - 04/05/2023  3:27 PM      Passed - TSH in normal range and within 360 days    TSH  Date Value Ref Range Status  11/11/2022 0.728 0.350 - 4.500 uIU/mL Final    Comment:    Performed by a 3rd Generation assay with a functional sensitivity of <=0.01 uIU/mL. Performed at [facility], [address] [place], [place] [address]   10/18/2022 2.96 0.40 - 4.50 mIU/L Final         Passed - Valid encounter within last 12 months    Recent Outpatient Visits           2 months ago LUQ abdominal pain   [facility] [place], Salvadore Oxford, NP   5 months ago Essential hypertension   [facility] [place], Salvadore Oxford, NP   5 months ago Essential hypertension   [place] [facility] [place], Salvadore Oxford, NP   6 months ago Postablative hypothyroidism   [facility] [place], Salvadore Oxford, NP   6 months ago Encounter for general adult medical examination with abnormal findings   [place] [facility] [place], Salvadore Oxford, NP       Future Appointments             In 1 week [place], Salvadore Oxford, NP [place] [facility]

## 2023-04-10 ENCOUNTER — Encounter: Payer: Self-pay | Admitting: Gastroenterology

## 2023-04-11 ENCOUNTER — Ambulatory Visit
Admission: RE | Admit: 2023-04-11 | Discharge: 2023-04-11 | Disposition: A | Discharge status: Home / Self Care | Payer: 59 | Attending: Gastroenterology | Admitting: Gastroenterology

## 2023-04-11 ENCOUNTER — Encounter: Payer: Self-pay | Admitting: Gastroenterology

## 2023-04-11 ENCOUNTER — Ambulatory Visit: Payer: 59 | Admitting: Anesthesiology

## 2023-04-11 ENCOUNTER — Encounter
Admission: RE | Disposition: A | Discharge status: Home / Self Care | Payer: Self-pay | Source: Home / Self Care | Attending: Gastroenterology

## 2023-04-11 DIAGNOSIS — I10 Essential (primary) hypertension: Secondary | ICD-10-CM | POA: Insufficient documentation

## 2023-04-11 DIAGNOSIS — K259 Gastric ulcer, unspecified as acute or chronic, without hemorrhage or perforation: Secondary | ICD-10-CM | POA: Insufficient documentation

## 2023-04-11 DIAGNOSIS — K2970 Gastritis, unspecified, without bleeding: Secondary | ICD-10-CM | POA: Insufficient documentation

## 2023-04-11 DIAGNOSIS — K635 Polyp of colon: Secondary | ICD-10-CM | POA: Diagnosis not present

## 2023-04-11 DIAGNOSIS — R109 Unspecified abdominal pain: Secondary | ICD-10-CM | POA: Insufficient documentation

## 2023-04-11 DIAGNOSIS — R1013 Epigastric pain: Secondary | ICD-10-CM

## 2023-04-11 DIAGNOSIS — K2950 Unspecified chronic gastritis without bleeding: Secondary | ICD-10-CM

## 2023-04-11 DIAGNOSIS — K31A19 Gastric intestinal metaplasia without dysplasia, unspecified site: Secondary | ICD-10-CM | POA: Diagnosis not present

## 2023-04-11 DIAGNOSIS — B9681 Helicobacter pylori [H. pylori] as the cause of diseases classified elsewhere: Secondary | ICD-10-CM | POA: Insufficient documentation

## 2023-04-11 DIAGNOSIS — F17210 Nicotine dependence, cigarettes, uncomplicated: Secondary | ICD-10-CM | POA: Diagnosis not present

## 2023-04-11 DIAGNOSIS — J449 Chronic obstructive pulmonary disease, unspecified: Secondary | ICD-10-CM | POA: Diagnosis not present

## 2023-04-11 DIAGNOSIS — Z1211 Encounter for screening for malignant neoplasm of colon: Secondary | ICD-10-CM | POA: Insufficient documentation

## 2023-04-11 DIAGNOSIS — D124 Benign neoplasm of descending colon: Secondary | ICD-10-CM | POA: Insufficient documentation

## 2023-04-11 DIAGNOSIS — D126 Benign neoplasm of colon, unspecified: Secondary | ICD-10-CM

## 2023-04-11 HISTORY — PX: ESOPHAGOGASTRODUODENOSCOPY (EGD) WITH PROPOFOL: SHX5813

## 2023-04-11 HISTORY — PX: POLYPECTOMY: SHX5525

## 2023-04-11 HISTORY — PX: HEMOSTASIS CLIP PLACEMENT: SHX6857

## 2023-04-11 HISTORY — PX: COLONOSCOPY WITH PROPOFOL: SHX5780

## 2023-04-11 HISTORY — PX: BIOPSY: SHX5522

## 2023-04-11 SURGERY — COLONOSCOPY WITH PROPOFOL
Anesthesia: General

## 2023-04-11 MED ORDER — LIDOCAINE HCL (CARDIAC) PF 100 MG/5ML IV SOSY
PREFILLED_SYRINGE | INTRAVENOUS | Status: DC | PRN
  Administered 2023-04-11: 80 mg via INTRAVENOUS

## 2023-04-11 MED ORDER — DEXMEDETOMIDINE HCL IN NACL 200 MCG/50ML IV SOLN
INTRAVENOUS | Status: DC | PRN
  Administered 2023-04-11: 20 ug via INTRAVENOUS

## 2023-04-11 MED ORDER — SODIUM CHLORIDE 0.9 % IV SOLN: INTRAVENOUS | Status: DC

## 2023-04-11 MED ORDER — PROPOFOL 10 MG/ML IV BOLUS
INTRAVENOUS | Status: DC | PRN
  Administered 2023-04-11: 50 mg via INTRAVENOUS

## 2023-04-11 MED ORDER — PROPOFOL 500 MG/50ML IV EMUL
INTRAVENOUS | Status: DC | PRN
  Administered 2023-04-11: 75 ug/kg/min via INTRAVENOUS

## 2023-04-11 NOTE — Transfer of Care (Signed)
Immediate Anesthesia Transfer of Care Note  Patient: Gina Wells  Procedure(s) Performed: COLONOSCOPY WITH PROPOFOL ESOPHAGOGASTRODUODENOSCOPY (EGD) WITH PROPOFOL BIOPSY POLYPECTOMY HEMOSTASIS CLIP PLACEMENT  Patient Location: PACU  Anesthesia Type:General  Level of Consciousness: sedated  Airway & Oxygen Therapy: Patient Spontanous Breathing  Post-op Assessment: Report given to RN and Post -op Vital signs reviewed and stable  Post vital signs: Reviewed and stable  Last Vitals:  Vitals Value Taken Time  BP 105/90 04/11/23 0823  Temp    Pulse 70 04/11/23 0823  Resp 21 04/11/23 0823  SpO2 93 % 04/11/23 0823    Last Pain:  Vitals:   04/11/23 0823  TempSrc:   PainSc: Asleep         Complications: No notable events documented.

## 2023-04-11 NOTE — Anesthesia Postprocedure Evaluation (Signed)
Anesthesia Post Note  Patient: [name]  Procedure(s) Performed: COLONOSCOPY WITH PROPOFOL ESOPHAGOGASTRODUODENOSCOPY (EGD) WITH PROPOFOL BIOPSY POLYPECTOMY HEMOSTASIS CLIP PLACEMENT  Patient location during evaluation: Endoscopy Anesthesia Type: General Level of consciousness: awake and alert Pain management: pain level controlled Vital Signs Assessment: post-procedure vital signs reviewed and stable Respiratory status: spontaneous breathing, nonlabored ventilation, respiratory function stable and patient connected to nasal cannula oxygen Cardiovascular status: blood pressure returned to baseline and stable Postop Assessment: no apparent nausea or vomiting Anesthetic complications: no   No notable events documented.   Last Vitals:  Vitals:   04/11/23 0649 04/11/23 0823  BP: (!) 176/84 (!) 105/90  Pulse: 70 70  Resp: 18 (!) 21  Temp: (!) 36.2 C (!) 35.4 C  SpO2: 98% 93%    Last Pain:  Vitals:   04/11/23 0823  TempSrc: Temporal  PainSc: Asleep                 Louie Boston

## 2023-04-11 NOTE — Op Note (Signed)
[facility] Patient Name: Gina Wells Procedure Date: 04/11/2023 7:23 AM MRN: [id number] Account #: [id number] Date of Birth: May 21, 1961 Admit Type: Outpatient Age: 62 Room: [facility] ENDO ROOM 3 Gender: Female Note Status: Finalized Instrument Name: Prentice Docker [id number] Procedure:             Colonoscopy Indications:           Screening for colorectal malignant neoplasm Providers:             Wyline Mood MD, MD Referring MD:          Lorre Munroe (Referring MD) Medicines:             Monitored Anesthesia Care Complications:         No immediate complications. Procedure:             Pre-Anesthesia Assessment:                        - Prior to the procedure, a History and Physical was                         performed, and patient medications, allergies and                         sensitivities were reviewed. The patient's tolerance                         of previous anesthesia was reviewed.                        - The risks and benefits of the procedure and the                         sedation options and risks were discussed with the                         patient. All questions were answered and informed                         consent was obtained.                        - ASA Grade Assessment: II - A patient with mild                         systemic disease.                        After obtaining informed consent, the colonoscope was                         passed under direct vision. Throughout the procedure,                         the patient's blood pressure, pulse, and oxygen                         saturations were monitored continuously. The                         Colonoscope was introduced  through the anus and                         advanced to the the cecum, identified by the                         appendiceal orifice. The colonoscopy was performed                         with ease. The patient tolerated the procedure  well.                         The quality of the bowel preparation was fair. The                         ileocecal valve, appendiceal orifice, and rectum were                         photographed. Findings:      The perianal and digital rectal examinations were normal.      A 12 mm polyp was found in the proximal descending colon. The polyp was       sessile. The polyp was removed with a cold snare. Resection and       retrieval were complete. Biopsies were taken with a cold forceps for       histology. To prevent bleeding after the polypectomy, one hemostatic       clip was successfully placed. Clip manufacturer: [employer].       There was no bleeding at the end of the procedure.      The exam was otherwise without abnormality on direct and retroflexion       views. Impression:            - Preparation of the colon was fair.                        - One 12 mm polyp in the proximal descending colon,                         removed with a cold snare. Resected and retrieved.                         Biopsied. Clip was placed. Clip manufacturer: [employer].                        - The examination was otherwise normal on direct and                         retroflexion views. Recommendation:        - Discharge patient to home (with escort).                        - Resume previous diet.                        - Continue present medications.                        -  Await pathology results.                        - Repeat colonoscopy in 1 year because the bowel                         preparation was suboptimal. Procedure Code(s):     --- Professional ---                        [phone], Colonoscopy, flexible; with removal of                         tumor(s), polyp(s), or other lesion(s) by snare                         technique Diagnosis Code(s):     --- Professional ---                        Z12.11, Encounter for screening for malignant neoplasm                          of colon                        D12.4, Benign neoplasm of descending colon CPT copyright 2022 American Medical Association. All rights reserved. The codes documented in this report are preliminary and upon coder review may  be revised to meet current compliance requirements. Wyline Mood, MD Wyline Mood MD, MD 04/11/2023 8:22:26 AM This report has been signed electronically. Number of Addenda: 0 Note Initiated On: 04/11/2023 7:23 AM Scope Withdrawal Time: 0 hours 13 minutes 52 seconds  Total Procedure Duration: 0 hours 15 minutes 35 seconds  Estimated Blood Loss:  Estimated blood loss: none.      [facility]

## 2023-04-11 NOTE — Anesthesia Preprocedure Evaluation (Signed)
Anesthesia Evaluation  Patient identified by MRN, date of birth, ID band Patient awake    Reviewed: Allergy & Precautions, NPO status , Patient's Chart, lab work & pertinent test results  History of Anesthesia Complications Negative for: history of anesthetic complications  Airway Mallampati: III  TM Distance: >3 FB Neck ROM: full    Dental no notable dental hx.    Pulmonary COPD, Current Smoker   Pulmonary exam normal        Cardiovascular hypertension, On Medications negative cardio ROS Normal cardiovascular exam     Neuro/Psych  Headaches PSYCHIATRIC DISORDERS Anxiety Depression       GI/Hepatic ,GERD  Medicated,,(+)     substance abuse  alcohol use  Endo/Other  Hypothyroidism    Renal/GU negative Renal ROS  negative genitourinary   Musculoskeletal   Abdominal   Peds  Hematology negative hematology ROS (+)   Anesthesia Other Findings Past Medical History: No date: Alcoholism (HCC) No date: Anxiety No date: COPD (chronic obstructive pulmonary disease) (HCC) No date: Depression No date: Fatigue No date: Hx of migraine headaches No date: Hypertension No date: Hyperthyroidism No date: Toxic multinodular goiter w/o crisis  Past Surgical History: No date: KIDNEY STONE SURGERY No date: URETERAL STENT PLACEMENT     Comment:  2002 for kidney stones No date: VAGINAL HYSTERECTOMY  BMI    Body Mass Index: 34.36 kg/m      Reproductive/Obstetrics negative OB ROS                             Anesthesia Physical Anesthesia Plan  ASA: 3  Anesthesia Plan: General   Post-op Pain Management: Minimal or no pain anticipated   Induction: Intravenous  PONV Risk Score and Plan: 2 and Propofol infusion and TIVA  Airway Management Planned: Natural Airway and Nasal Cannula  Additional Equipment:   Intra-op Plan:   Post-operative Plan:   Informed Consent: I have reviewed the  patients History and Physical, chart, labs and discussed the procedure including the risks, benefits and alternatives for the proposed anesthesia with the patient or authorized representative who has indicated his/her understanding and acceptance.     Dental Advisory Given  Plan Discussed with: Anesthesiologist, CRNA and Surgeon  Anesthesia Plan Comments: (Patient consented for risks of anesthesia including but not limited to:  - adverse reactions to medications - risk of airway placement if required - damage to eyes, teeth, lips or other oral mucosa - nerve damage due to positioning  - sore throat or hoarseness - Damage to heart, brain, nerves, lungs, other parts of body or loss of life  Patient voiced understanding.)       Anesthesia Quick Evaluation

## 2023-04-11 NOTE — Op Note (Signed)
[facility]astroenterology Patient Name: Gina Wells Procedure Date: 04/11/2023 7:23 AM MRN: [id number] Account #: [id number] Date of Birth: March 05, 1961 Admit Type: Outpatient Age: 62 Room: [facility] ENDO ROOM 3 Gender: Female Note Status: Finalized Instrument Name: Upper Endoscope [id number] Procedure:             Upper GI endoscopy Indications:           Abdominal pain Providers:             Wyline Mood MD, MD Referring MD:          Lorre Munroe (Referring MD) Medicines:             Monitored Anesthesia Care Complications:         No immediate complications. Procedure:             Pre-Anesthesia Assessment:                        - Prior to the procedure, a History and Physical was                         performed, and patient medications, allergies and                         sensitivities were reviewed. The patient's tolerance                         of previous anesthesia was reviewed.                        - The risks and benefits of the procedure and the                         sedation options and risks were discussed with the                         patient. All questions were answered and informed                         consent was obtained.                        - ASA Grade Assessment: II - A patient with mild                         systemic disease.                        After obtaining informed consent, the endoscope was                         passed under direct vision. Throughout the procedure,                         the patient's blood pressure, pulse, and oxygen                         saturations were monitored continuously. The                         Endosonoscope was introduced  through the mouth, and                         advanced to the third part of duodenum. The upper GI                         endoscopy was accomplished with ease. The patient                         tolerated the procedure well. Findings:      The esophagus  was normal.      The examined duodenum was normal.      Diffuse moderate mucosal changes characterized by congestion and       erythema were found in the entire examined stomach. Biopsies were taken       with a cold forceps for histology.      One non-bleeding superficial gastric ulcer with a clean ulcer base       (Forrest Class III) was found in the gastric antrum and on the lesser       curvature of the gastric antrum. The lesion was 6 mm in largest       dimension.      The cardia and gastric fundus were normal on retroflexion. Impression:            - Normal esophagus.                        - Normal examined duodenum.                        - Congested and erythematous mucosa in the stomach.                         Biopsied.                        - Non-bleeding gastric ulcer with a clean ulcer base                         (Forrest Class III). Recommendation:        - Await pathology results.                        - No ibuprofen, naproxen, or other non-steroidal                         anti-inflammatory drugs.                        - Use Prilosec (omeprazole) 40 mg PO daily for 3                         months.                        - Perform a colonoscopy today. Procedure Code(s):     --- Professional ---                        [phone], Esophagogastroduodenoscopy, flexible,  transoral; with biopsy, single or multiple Diagnosis Code(s):     --- Professional ---                        K31.89, Other diseases of stomach and duodenum                        K25.9, Gastric ulcer, unspecified as acute or chronic,                         without hemorrhage or perforation                        R10.9, Unspecified abdominal pain CPT copyright 2022 American Medical Association. All rights reserved. The codes documented in this report are preliminary and upon coder review may  be revised to meet current compliance requirements. Wyline Mood, MD Wyline Mood MD,  MD 04/11/2023 8:03:01 AM This report has been signed electronically. Number of Addenda: 0 Note Initiated On: 04/11/2023 7:23 AM Estimated Blood Loss:  Estimated blood loss: none.      [facility]

## 2023-04-11 NOTE — H&P (Signed)
Wyline Mood, MD [address], [address], [place], [place], [address] [address], [address], [place], [place], [address] Phone: [phone]  Fax: [phone]  Primary Care Physician:  Lorre Munroe, NP   Pre-Procedure History & Physical: HPI:  TIMEKA ROSLER is a 62 y.o. female is here for an endoscopy and colonoscopy    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     Past Surgical History:  Procedure Laterality Date   KIDNEY STONE SURGERY     URETERAL STENT PLACEMENT     2002 for kidney stones   VAGINAL HYSTERECTOMY      Prior to Admission medications   Medication Sig Start Date End Date Taking? Authorizing Provider  Aspirin-Salicylamide-Caffeine (BC HEADACHE POWDER PO) Take by mouth 3 (three) times daily as needed.   Yes [provider]  albuterol (VENTOLIN HFA) 108 (90 Base) MCG/ACT inhaler Inhale 2 puffs into the lungs every 6 (six) hours as needed for wheezing or shortness of breath. 08/02/21   Lorre Munroe, NP  amLODipine-olmesartan (AZOR) 10-40 MG tablet Take 1 tablet by mouth daily. 09/27/22   Lorre Munroe, NP  Budeson-Glycopyrrol-Formoterol (BREZTRI AEROSPHERE) 160-9-4.8 MCG/ACT AERO Inhale into the lungs.    [provider]  labetalol (NORMODYNE) 100 MG tablet Take 1 tablet (100 mg total) by mouth 3 (three) times daily. 11/01/22   Lorre Munroe, NP  linaclotide (LINZESS) 72 MCG capsule Take 1 capsule (72 mcg total) by mouth daily as needed. 01/15/23   Lorre Munroe, NP  omeprazole (PRILOSEC) 20 MG capsule Take 1 capsule (20 mg total) by mouth 2 (two) times daily before a meal. Patient taking differently: Take 20 mg by mouth as needed. 02/16/20   Malfi, Jodelle Gross, FNP  potassium chloride SA (KLOR-CON M) 20 MEQ tablet Take 1  tablet (20 mEq total) by mouth daily. 03/02/23   Lorre Munroe, NP  SYNTHROID 200 MCG tablet TAKE 1 TABLET BY MOUTH ONCE DAILY BEFORE BREAKFAST 04/06/23   Lorre Munroe, NP  torsemide (DEMADEX) 20 MG tablet Take 1 tablet (20 mg total) by mouth daily. 03/02/23   Lorre Munroe, NP    Allergies as of 02/23/2023   (No Known Allergies)    Family History  Problem Relation Age of Onset   Diabetes Mother    Alcohol abuse Father    Diabetes Father    Cancer Father    Cancer Paternal Aunt    Alcohol abuse Brother     Social History   Socioeconomic History   Marital status: Single    Spouse name: Not on file   Number of children: Not on file   Years of education: Not on file   Highest education level: Not on file  Occupational History   Not on file  Tobacco Use   Smoking status: Every Day    Current packs/day: 0.25    Average packs/day: 0.3 packs/day for 50.0 years (12.5 ttl pk-yrs)    Types: Cigarettes    Start date: 07/25/1975   Smokeless tobacco: Never  Vaping Use   Vaping status: Never Used  Substance and Sexual Activity   Alcohol use: No    Alcohol/week: 0.0 standard drinks of  alcohol   Drug use: No   Sexual activity: Yes  Other Topics Concern   Not on file  Social History Narrative   Not on file   Social Determinants of Health   Financial Resource Strain: Not on file  Food Insecurity: Not on file  Transportation Needs: Not on file  Physical Activity: Not on file  Stress: Not on file  Social Connections: Not on file  Intimate Partner Violence: Not on file    Review of Systems: See HPI, otherwise negative ROS  Physical Exam: BP (!) 176/84   Pulse 70   Temp (!) 97.1 F (36.2 C) (Temporal)   Resp 18   Ht 5\' 4"  (1.626 m)   Wt 90.8 kg   SpO2 98%   BMI 34.36 kg/m  General:   Alert,  pleasant and cooperative in NAD Head:  Normocephalic and atraumatic. Neck:  Supple; no masses or thyromegaly. Lungs:  Clear throughout to auscultation, normal respiratory  effort.    Heart:  +S1, +S2, Regular rate and rhythm, No edema. Abdomen:  Soft, nontender and nondistended. Normal bowel sounds, without guarding, and without rebound.   Neurologic:  Alert and  oriented x4;  grossly normal neurologically.  Impression/Plan: KHARMEN BUSCHMANN is here for an endoscopy and colonoscopy  to be performed for  evaluation of abdominal paina nd screening colonoscopy     Risks, benefits, limitations, and alternatives regarding endoscopy have been reviewed with the patient.  Questions have been answered.  All parties agreeable.   Wyline Mood, MD  04/11/2023, 7:50 AM

## 2023-04-12 ENCOUNTER — Encounter: Payer: Self-pay | Admitting: Gastroenterology

## 2023-04-12 ENCOUNTER — Telehealth: Payer: Self-pay

## 2023-04-12 LAB — SURGICAL PATHOLOGY

## 2023-04-12 NOTE — Telephone Encounter (Signed)
-----   Message from Wyline Mood sent at 04/12/2023  1:42 PM EDT ----- Requires quadruple bismuth based therapy for H. pylori gastritis, retesting 6 weeks after treatment of PPI.  EGD in 6 months for gastric mapping due to intestinal metaplasia.

## 2023-04-12 NOTE — Telephone Encounter (Signed)
Put a recall in for 6 months. Called and left a message for call back to go over results. Pended medication. Will put in reminder when I send medication for repeat H pylori In 6 weeks

## 2023-04-13 MED ORDER — METRONIDAZOLE 250 MG PO TABS: 250.0000 mg | ORAL_TABLET | Freq: Four times a day (QID) | ORAL | 0 refills | Status: AC

## 2023-04-13 MED ORDER — OMEPRAZOLE 20 MG PO CPDR
20.0000 mg | DELAYED_RELEASE_CAPSULE | Freq: Two times a day (BID) | ORAL | 0 refills | Status: DC
Start: 2023-04-13 — End: 2023-11-14

## 2023-04-13 MED ORDER — BISMUTH SUBSALICYLATE 262 MG PO CHEW: CHEWABLE_TABLET | ORAL | 0 refills | Status: AC

## 2023-04-13 MED ORDER — TETRACYCLINE HCL 500 MG PO CAPS: 500.0000 mg | ORAL_CAPSULE | Freq: Four times a day (QID) | ORAL | 0 refills | Status: AC

## 2023-04-13 NOTE — Telephone Encounter (Signed)
Called and left a message for call back se mychart message to patient sent medication to pharmacy

## 2023-04-19 ENCOUNTER — Ambulatory Visit: Payer: 59 | Admitting: Internal Medicine

## 2023-04-19 NOTE — Progress Notes (Deleted)
Subjective:    Patient ID: Gina Wells, female    DOB: 03/20/1961, 62 y.o.   MRN: [id number]  HPI  Patient presents to clinic today for follow-up of chronic conditions.  HTN: Her BP today is.  She is taking torsemide, amlodipine-olmesartan and labetalol as prescribed.  ECG from 10/2022 reviewed.  Migraines: These occur a few times per month.  Triggered by stress.  She is not taking any specific medication for this at this time.  She does not follow with neurology.  COPD: She reports chronic cough but denies shortness of breath.  She is taking breztri and albuterol as prescribed.  There are no PFTs on file.  She does smoke.  She does not follow with pulmonology.  GERD: Triggered by.  She denies breakthrough on omeprazole.  There is no upper GI on file.  CKD: Her last creatinine was 1.08, GFR 59, 12/2022.  She is on olmesartan for renal protection.  She does not follow with nephrology.  Constipation: Managed with linzess.  Colonoscopy from 03/2023 reviewed.  Hypothyroidism: Post ablative.  She denies any issues on her levothyroxine.  She does not follow with endocrinology.  HLD with aortic atherosclerosis: Her last LDL was 130, triglycerides 78, 08/2022.  She is not taking any cholesterol-lowering medication but is taking aspirin.  She tries to consume a low-fat diet.  Anxiety and depression: Chronic however she is not taking any medications for this.  She is not seeing a therapist.  She denies SI/HI.  Prediabetes: Her last A1c was 6.2%, 08/2022.  She is not taking any oral diabetic medication at this time.  She does not check her sugars.  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 18 g 0   amLODipine-olmesartan (AZOR) 10-40 MG tablet Take 1 tablet by mouth daily. 90 tablet 0   Aspirin-Salicylamide-Caffeine (BC HEADACHE POWDER PO) Take by mouth 3 (three) times daily as needed.     bismuth subsalicylate (PEPTO BISMOL) 262 MG chewable tablet Chew 2 tablets 4 times a day for 14 days 112 tablet 0   Budeson-Glycopyrrol-Formoterol (BREZTRI AEROSPHERE) 160-9-4.8 MCG/ACT AERO Inhale into the lungs.     labetalol (NORMODYNE) 100 MG tablet Take 1 tablet (100 mg total) by mouth 3 (three) times daily. 270 tablet 0   linaclotide (LINZESS) 72 MCG capsule Take 1 capsule (72 mcg total) by mouth daily as needed. 30 capsule 0   metroNIDAZOLE (FLAGYL) 250 MG tablet Take 1 tablet (250 mg total) by mouth 4 (four) times daily for 14 days. 56 tablet 0   omeprazole (PRILOSEC) 20 MG capsule Take 1 capsule (20 mg total) by mouth 2 (two) times daily before a meal. (Patient taking differently: Take 20 mg by mouth as needed.) 180 capsule 1   omeprazole (PRILOSEC) 20 MG capsule Take 1 capsule (20 mg total) by mouth 2 (two) times daily before a meal for 14 days. 28 capsule 0   potassium chloride SA (KLOR-CON M) 20  MEQ tablet Take 1 tablet (20 mEq total) by mouth daily. 90 tablet 0   SYNTHROID 200 MCG tablet TAKE 1 TABLET BY MOUTH ONCE DAILY BEFORE BREAKFAST 90 tablet 0   tetracycline (SUMYCIN) 500 MG capsule Take 1 capsule (500 mg total) by mouth in the morning, at noon, in the evening, and at bedtime for 14 days. 56 capsule 0   torsemide (DEMADEX) 20 MG tablet Take 1 tablet (20 mg total) by mouth daily. 90 tablet 0   No current facility-administered medications for this visit.    No Known Allergies  Family History  Problem Relation Age of Onset   Diabetes Mother    Alcohol abuse Father    Diabetes Father    Cancer Father    Cancer  Paternal Aunt    Alcohol abuse Brother     Social History   Socioeconomic History   Marital status: Single    Spouse name: Not on file   Number of children: Not on file   Years of education: Not on file   Highest education level: GED or equivalent  Occupational History   Not on file  Tobacco Use   Smoking status: Every Day    Current packs/day: 0.25    Average packs/day: 0.3 packs/day for 50.0 years (12.5 ttl pk-yrs)    Types: Cigarettes    Start date: 07/25/1975   Smokeless tobacco: Never  Vaping Use   Vaping status: Never Used  Substance and Sexual Activity   Alcohol use: No    Alcohol/week: 0.0 standard drinks of alcohol   Drug use: No   Sexual activity: Yes  Other Topics Concern   Not on file  Social History Narrative   Not on file   Social Determinants of Health   Financial Resource Strain: Medium Risk (04/18/2023)   Overall Financial Resource Strain (CARDIA)    Difficulty of Paying Living Expenses: Somewhat hard  Food Insecurity: No Food Insecurity (04/18/2023)   Hunger Vital Sign    Worried About Running Out of Food in the Last Year: Never true    Ran Out of Food in the Last Year: Never true  Transportation Needs: No Transportation Needs (04/18/2023)   PRAPARE - Administrator, Civil Service (Medical): No    Lack of Transportation (Non-Medical): No  Physical Activity: Sufficiently Active (04/18/2023)   Exercise Vital Sign    Days of Exercise per Week: 4 days    Minutes of Exercise per Session: 40 min  Stress: No Stress Concern Present (04/18/2023)   [employer] of Occupational Health - Occupational Stress Questionnaire    Feeling of Stress : Only a little  Social Connections: Moderately Integrated (04/18/2023)   Social Connection and Isolation Panel [NHANES]    Frequency of Communication with Friends and Family: More than three times a week    Frequency of Social Gatherings with Friends and Family: More than three times a week    Attends  Religious Services: More than 4 times per year    Active Member of [employer] or Organizations: Yes    Attends Engineer, structural: More than 4 times per year    Marital Status: Divorced  Catering manager Violence: Not on file     Constitutional: Patient reports intermittent headaches.  Denies fever, malaise, fatigue, or abrupt weight changes.  HEENT: Denies eye pain, eye redness, ear pain, ringing in the ears, wax buildup, runny nose, nasal congestion, bloody nose, or sore throat. Respiratory: Patient reports cough.  Denies difficulty breathing,  shortness of breath, or sputum production.   Cardiovascular: Denies chest pain, chest tightness, palpitations or swelling in the hands or feet.  Gastrointestinal: Patient reports constipation.  Denies abdominal pain, bloating, diarrhea or blood in the stool.  GU: Denies urgency, frequency, pain with urination, burning sensation, blood in urine, odor or discharge. Musculoskeletal: Denies decrease in range of motion, difficulty with gait, muscle pain or joint pain and swelling.  Skin: Denies redness, rashes, lesions or ulcercations.  Neurological: Denies dizziness, difficulty with memory, difficulty with speech or problems with balance and coordination.  Psych: Patient has a history of anxiety and depression.  Denies SI/HI.  No other specific complaints in a complete review of systems (except as listed in HPI above).  Objective:   Physical Exam    There were no vitals taken for this visit. Wt Readings from Last 3 Encounters:  04/11/23 200 lb 3.2 oz (90.8 kg)  02/23/23 195 lb 6 oz (88.6 kg)  01/15/23 199 lb (90.3 kg)    General: Appears their stated age, well developed, well nourished in NAD. Skin: Warm, dry and intact. No rashes, lesions or ulcerations noted. HEENT: Head: normal shape and size; Eyes: sclera white, no icterus, conjunctiva pink, PERRLA and EOMs intact; Ears: Tm's gray and intact, normal light reflex; Nose: mucosa pink and  moist, septum midline; Throat/Mouth: Teeth present, mucosa pink and moist, no exudate, lesions or ulcerations noted.  Neck:  Neck supple, trachea midline. No masses, lumps or thyromegaly present.  Cardiovascular: Normal rate and rhythm. S1,S2 noted.  No murmur, rubs or gallops noted. No JVD or BLE edema. No carotid bruits noted. Pulmonary/Chest: Normal effort and positive vesicular breath sounds. No respiratory distress. No wheezes, rales or ronchi noted.  Abdomen: Soft and nontender. Normal bowel sounds. No distention or masses noted. Liver, spleen and kidneys non palpable. Musculoskeletal: Normal range of motion. No signs of joint swelling. No difficulty with gait.  Neurological: Alert and oriented. Cranial nerves II-XII grossly intact. Coordination normal.  Psychiatric: Mood and affect normal. Behavior is normal. Judgment and thought content normal.     BMET    Component Value Date/Time   NA 137 01/10/2023 0026   NA 140 03/08/2017 1909   K 4.6 01/10/2023 0026   CL 102 01/10/2023 0026   CO2 26 01/10/2023 0026   GLUCOSE 96 01/10/2023 0026   BUN 28 (H) 01/10/2023 0026   BUN 10 03/08/2017 1909   CREATININE 1.09 (H) 01/10/2023 0026   CREATININE 0.78 10/18/2022 0842   CALCIUM 8.5 (L) 01/10/2023 0026   GFRNONAA 58 (L) 01/10/2023 0026   GFRNONAA 59 (L) 02/02/2020 1142   GFRAA 69 02/02/2020 1142    Lipid Panel     Component Value Date/Time   CHOL 219 (H) 09/13/2022 0829   CHOL 233 (H) 03/08/2017 1909   TRIG 78 09/13/2022 0829   HDL 72 09/13/2022 0829   HDL 62 03/08/2017 1909   CHOLHDL 3.0 09/13/2022 0829   VLDL 9 05/19/2018 1549   LDLCALC 130 (H) 09/13/2022 0829    CBC    Component Value Date/Time   WBC 10.3 01/10/2023 0026   RBC 4.39 01/10/2023 0026   HGB 13.0 01/10/2023 0026   HGB 13.0 03/08/2017 1909   HCT 41.2 01/10/2023 0026   HCT 39.8 03/08/2017 1909   PLT 236 01/10/2023 0026   PLT 271 03/08/2017 1909   MCV 93.8 01/10/2023 0026   MCV 89 03/08/2017 1909   MCH  29.6 01/10/2023 0026   MCHC 31.6 01/10/2023 0026  RDW 13.7 01/10/2023 0026   RDW 14.4 03/08/2017 1909   LYMPHSABS 3.9 01/10/2023 0026   LYMPHSABS 3.2 (H) 01/19/2015 0808   MONOABS 0.7 01/10/2023 0026   EOSABS 0.1 01/10/2023 0026   EOSABS 0.1 01/19/2015 0808   BASOSABS 0.1 01/10/2023 0026   BASOSABS 0.0 01/19/2015 0808    Hgb A1C Lab Results  Component Value Date   HGBA1C 6.2 (H) 09/13/2022          Assessment & Plan:     RTC in 6 months for your annual exam Nicki Reaper, NP

## 2023-05-08 ENCOUNTER — Other Ambulatory Visit: Payer: Self-pay | Admitting: Internal Medicine

## 2023-05-08 NOTE — Telephone Encounter (Signed)
Requested medication (s) are due for refill today - unsure  Requested medication (s) are on the active medication list -yes  Future visit scheduled -no  Last refill: 10/18/22  Notes to clinic: listed as historical provider, off protocol-provider review   Requested Prescriptions  Pending Prescriptions Disp Refills   BREZTRI AEROSPHERE 160-9-4.8 MCG/ACT AERO [Pharmacy Med Name: BREZTRI 160-9-4.8 MCG/ACT  AER] 33 g 0    Sig: INHALE 2 PUFFS TWICE DAILY     Off-Protocol Failed - 05/08/2023  1:26 PM      Failed - Medication not assigned to a protocol, review manually.      Passed - Valid encounter within last 12 months    Recent Outpatient Visits           3 months ago LUQ abdominal pain   [place] [facility] [place], Salvadore Oxford, NP   6 months ago Essential hypertension   [place] [facility] [place], Salvadore Oxford, NP   6 months ago Essential hypertension   [place] [facility] [place], Salvadore Oxford, NP   7 months ago Postablative hypothyroidism   [place] [facility] [place], Salvadore Oxford, NP   7 months ago Encounter for general adult medical examination with abnormal findings   [place] [facility] [place], Salvadore Oxford, NP                 Requested Prescriptions  Pending Prescriptions Disp Refills   BREZTRI AEROSPHERE 160-9-4.8 MCG/ACT AERO [Pharmacy Med Name: BREZTRI 160-9-4.8 MCG/ACT  AER] 33 g 0    Sig: INHALE 2 PUFFS TWICE DAILY     Off-Protocol Failed - 05/08/2023  1:26 PM      Failed - Medication not assigned to a protocol, review manually.      Passed - Valid encounter within last 12 months    Recent Outpatient Visits           3 months ago LUQ abdominal pain   [place] [facility] [place], Salvadore Oxford, NP   6 months ago Essential hypertension   [place] [facility] [place], Salvadore Oxford, NP   6 months ago Essential hypertension   [place] [facility] [place], Salvadore Oxford, NP   7 months ago Postablative hypothyroidism   [place] [facility] [place], Salvadore Oxford, NP   7 months ago Encounter for general adult medical examination with abnormal findings   [place] [facility] [place], Salvadore Oxford, NP

## 2023-05-10 ENCOUNTER — Other Ambulatory Visit: Payer: Self-pay | Admitting: Internal Medicine

## 2023-05-10 NOTE — Progress Notes (Unsigned)
Celso Amy, PA-C [address]  [address]  [place], [place] [address]  Main: [phone]  Fax: [phone]   Primary Care Physician: Lorre Munroe, NP  Primary Gastroenterologist:  ***  CC: Follow-up abdominal pain and constipation  HPI: Gina Wells is a 62 y.o. female returns for 47-month follow-up of abdominal pain and constipation.  Has history of chronic lifelong constipation.  EGD 04/11/2023 by Dr. Tobi Bastos, to evaluate epigastric pain, showed moderate gastritis, nonbleeding gastric ulcer, otherwise normal.  Treated with Prilosec 40 Mg daily.  Biopsies positive for H. Pylori and intestinal metaplasia.  Treated with quadruple bismuth based therapy.  Retest for H. pylori 6 weeks after finishing treatment.  Repeat EGD in 6 months for gastric mapping due to intestinal metaplasia.  Colonoscopy 04/11/2023: 12 mm hyperplastic polyp removed from descending colon.  Fair suboptimal prep.  Repeat in 1 year with extra prep.  She is taking Trulance and MiraLAX for constipation.  Lactulose as needed.  Dicyclomine 20 Mg 3 times daily as needed.  Could not tolerate Linzess which caused increased abdominal pain.  Abdominal pelvic CT 12/2022: No acute abnormality.  Current Outpatient Medications  Medication Sig Dispense Refill   albuterol (VENTOLIN HFA) 108 (90 Base) MCG/ACT inhaler Inhale 2 puffs into the lungs every 6 (six) hours as needed for wheezing or shortness of breath. 18 g 0   amLODipine-olmesartan (AZOR) 10-40 MG tablet Take 1 tablet by mouth daily. 90 tablet 0   Aspirin-Salicylamide-Caffeine (BC HEADACHE POWDER PO) Take by mouth 3 (three) times daily as needed.     bismuth subsalicylate (PEPTO BISMOL) 262 MG chewable tablet Chew 2 tablets 4 times a day for 14 days 112 tablet 0   BREZTRI AEROSPHERE 160-9-4.8 MCG/ACT AERO INHALE 2 PUFFS TWICE DAILY 33 g 0   labetalol (NORMODYNE) 100 MG tablet Take 1 tablet (100 mg total) by mouth 3 (three) times daily. 270 tablet 0    linaclotide (LINZESS) 72 MCG capsule Take 1 capsule (72 mcg total) by mouth daily as needed. 30 capsule 0   omeprazole (PRILOSEC) 20 MG capsule Take 1 capsule (20 mg total) by mouth 2 (two) times daily before a meal. (Patient taking differently: Take 20 mg by mouth as needed.) 180 capsule 1   omeprazole (PRILOSEC) 20 MG capsule Take 1 capsule (20 mg total) by mouth 2 (two) times daily before a meal for 14 days. 28 capsule 0   potassium chloride SA (KLOR-CON M) 20 MEQ tablet Take 1 tablet (20 mEq total) by mouth daily. 90 tablet 0   SYNTHROID 200 MCG tablet TAKE 1 TABLET BY MOUTH ONCE DAILY BEFORE BREAKFAST 90 tablet 0   torsemide (DEMADEX) 20 MG tablet Take 1 tablet (20 mg total) by mouth daily. 90 tablet 0   No current facility-administered medications for this visit.    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     Past Surgical History:  Procedure Laterality Date   BIOPSY  04/11/2023   Procedure: BIOPSY;  Surgeon: Wyline Mood, MD;  [facility] END[facility]COPY;  Service: Gastroenterology;;   COLONOSCOPY WITH PROPOFOL N/A 04/11/2023   Procedure: COLONOSCOPY WITH PROPOFOL;  Surgeon: Tobi Bastos,  Sharlet Salina, MD;  Location: [facility] ENDOSCOPY;  Service: Gastroenterology;  Laterality: N/A;   ESOPHAGOGASTRODUODENOSCOPY (EGD) WITH PROPOFOL N/A 04/11/2023   Procedure: ESOPHAGOGASTRODUODENOSCOPY (EGD) WITH PROPOFOL;  Surgeon: Wyline Mood, MD;  [facility]COPY;  Service: Gastroenterology;  Laterality: N/A;   HEMOSTASIS CLIP PLACEMENT  04/11/2023   Procedure: HEMOSTASIS CLIP PLACEMENT;  Surgeon: Wyline Mood, MD;  [facility] END[facility]COPY;  Service: Gastroenterology;;   KIDNEY STONE SURGERY     POLYPECTOMY  04/11/2023   Procedure: POLYPECTOMY;   Surgeon: Wyline Mood, MD;  Location: [facility] ENDOSCOPY;  Service: Gastroenterology;;   URETERAL STENT PLACEMENT     2002 for kidney stones   VAGINAL HYSTERECTOMY      Review of Systems:    All systems reviewed and negative except where noted in HPI.   Physical Examination:   There were no vitals taken for this visit.  General: Well-nourished, well-developed in no acute distress.  Lungs: Clear to auscultation bilaterally. Non-labored. Heart: Regular rate and rhythm, no murmurs rubs or gallops.  Abdomen: Bowel sounds are normal; Abdomen is Soft; No hepatosplenomegaly, masses or hernias;  No Abdominal Tenderness; No guarding or rebound tenderness. Neuro: Alert and oriented x 3.  Grossly intact.  Psych: Alert and cooperative, normal mood and affect.   Imaging Studies: No results found.  Assessment and Plan:   Gina Wells is a 62 y.o. y/o female returns for follow-up of  1.  H. pylori gastritis with intestinal metaplasia  Retest for H. pylori 6 weeks after finishing quadruple therapy (rx 9/20)  Repeat EGD in 6 months for gastric mapping  2.  Gastric ulcer  Continue PPI  Please avoid NSAIDs  3.  Chronic idiopathic constipation  Continue Trulance, lactulose, and MiraLAX  4.  History of colon polyps  Repeat colonoscopy in 1 year with extra prep  Colonoscopy 03/2023 showed suboptimal fair prep.   Celso Amy, PA-C  Follow up ***  BP check ***

## 2023-05-10 NOTE — Telephone Encounter (Signed)
Requested medication (s) are due for refill today: Yes  Requested medication (s) are on the active medication list: Yes  Last refill:  10/18/22  Future visit scheduled: No  Notes to clinic:  Unable to refill per protocol, last refill by another provider. Appears to be a duplicate request     Requested Prescriptions  Pending Prescriptions Disp Refills   BREZTRI AEROSPHERE 160-9-4.8 MCG/ACT AERO [Pharmacy Med Name: BREZTRI 160-9-4.8 MCG/ACT  AER] 33 g 0    Sig: INHALE 2 PUFFS TWICE DAILY     Off-Protocol Failed - 05/10/2023  1:34 PM      Failed - Medication not assigned to a protocol, review manually.      Passed - Valid encounter within last 12 months    Recent Outpatient Visits           3 months ago LUQ abdominal pain   [place] [place], Salvadore Oxford, NP   6 months ago Essential hypertension   [place] [facility] [place], Salvadore Oxford, NP   6 months ago Essential hypertension   [place] [facility] [place], Salvadore Oxford, NP   7 months ago Postablative hypothyroidism   [place] [facility] [place], Salvadore Oxford, NP   7 months ago Encounter for general adult medical examination with abnormal findings   [place] [facility] [place], Salvadore Oxford, NP

## 2023-05-11 ENCOUNTER — Ambulatory Visit (INDEPENDENT_AMBULATORY_CARE_PROVIDER_SITE_OTHER): Payer: 59 | Admitting: Physician Assistant

## 2023-05-11 ENCOUNTER — Encounter: Payer: Self-pay | Admitting: Physician Assistant

## 2023-05-11 DIAGNOSIS — K259 Gastric ulcer, unspecified as acute or chronic, without hemorrhage or perforation: Secondary | ICD-10-CM

## 2023-05-11 DIAGNOSIS — K2970 Gastritis, unspecified, without bleeding: Secondary | ICD-10-CM | POA: Diagnosis not present

## 2023-05-11 DIAGNOSIS — K31A0 Gastric intestinal metaplasia, unspecified: Secondary | ICD-10-CM | POA: Diagnosis not present

## 2023-05-11 DIAGNOSIS — B9681 Helicobacter pylori [H. pylori] as the cause of diseases classified elsewhere: Secondary | ICD-10-CM

## 2023-05-11 DIAGNOSIS — Z860100 Personal history of colon polyps, unspecified: Secondary | ICD-10-CM

## 2023-05-11 DIAGNOSIS — K5904 Chronic idiopathic constipation: Secondary | ICD-10-CM

## 2023-05-11 DIAGNOSIS — A048 Other specified bacterial intestinal infections: Secondary | ICD-10-CM

## 2023-06-08 ENCOUNTER — Telehealth: Payer: Self-pay

## 2023-06-08 DIAGNOSIS — A048 Other specified bacterial intestinal infections: Secondary | ICD-10-CM

## 2023-06-08 NOTE — Telephone Encounter (Signed)
Called and left a message for call back. Order the lab test

## 2023-06-08 NOTE — Telephone Encounter (Signed)
-----   Message from [facility] sent at 04/13/2023  8:36 AM EDT ----- Recheck h pylori

## 2023-06-11 NOTE — Telephone Encounter (Signed)
Called and left a message for call back  

## 2023-06-11 NOTE — Telephone Encounter (Signed)
Patient states she will not have the H pylori breath test done because she can not afford this at this time.

## 2023-06-18 ENCOUNTER — Other Ambulatory Visit: Payer: Self-pay | Admitting: Internal Medicine

## 2023-06-19 NOTE — Telephone Encounter (Signed)
Patient will need an office visit for further refills. Requested Prescriptions  Pending Prescriptions Disp Refills   amLODipine-olmesartan (AZOR) 10-40 MG tablet [Pharmacy Med Name: amLODIPine-Olmesartan 10-40 MG Oral Tablet] 90 tablet 0    Sig: Take 1 tablet by mouth once daily     Cardiovascular: CCB + ARB Combos Failed - 06/18/2023  1:48 PM      Failed - Cr in normal range and within 180 days    Creat  Date Value Ref Range Status  10/18/2022 0.78 0.50 - 1.05 mg/dL Final   Creatinine, Ser  Date Value Ref Range Status  01/10/2023 1.09 (H) 0.44 - 1.00 mg/dL Final         Passed - K in normal range and within 180 days    Potassium  Date Value Ref Range Status  01/10/2023 4.6 3.5 - 5.1 mmol/L Final         Passed - Na in normal range and within 180 days    Sodium  Date Value Ref Range Status  01/10/2023 137 135 - 145 mmol/L Final  03/08/2017 140 134 - 144 mmol/L Final         Passed - Patient is not pregnant      Passed - Last BP in normal range    BP Readings from Last 1 Encounters:  04/11/23 118/78         Passed - Valid encounter within last 6 months    Recent Outpatient Visits           5 months ago LUQ abdominal pain   [place] [facility] [place], Salvadore Oxford, NP   7 months ago Essential hypertension   [place] [facility] [place], Salvadore Oxford, NP   8 months ago Essential hypertension   [place] [facility] [place], Salvadore Oxford, NP   8 months ago Postablative hypothyroidism   [place] [facility] [place], Salvadore Oxford, NP   9 months ago Encounter for general adult medical examination with abnormal findings   [place] [facility] [place], Salvadore Oxford, NP

## 2023-09-21 DIAGNOSIS — H2513 Age-related nuclear cataract, bilateral: Secondary | ICD-10-CM | POA: Insufficient documentation

## 2023-09-21 DIAGNOSIS — H35011 Changes in retinal vascular appearance, right eye: Secondary | ICD-10-CM | POA: Insufficient documentation

## 2023-09-21 DIAGNOSIS — H43811 Vitreous degeneration, right eye: Secondary | ICD-10-CM | POA: Insufficient documentation

## 2023-09-21 DIAGNOSIS — H4311 Vitreous hemorrhage, right eye: Secondary | ICD-10-CM | POA: Insufficient documentation

## 2023-11-14 ENCOUNTER — Ambulatory Visit (INDEPENDENT_AMBULATORY_CARE_PROVIDER_SITE_OTHER): Admitting: Internal Medicine

## 2023-11-14 ENCOUNTER — Encounter: Payer: Self-pay | Admitting: Internal Medicine

## 2023-11-14 VITALS — BP 124/78 | Ht 64.0 in | Wt 196.8 lb

## 2023-11-14 DIAGNOSIS — R7303 Prediabetes: Secondary | ICD-10-CM | POA: Diagnosis not present

## 2023-11-14 DIAGNOSIS — E6609 Other obesity due to excess calories: Secondary | ICD-10-CM

## 2023-11-14 DIAGNOSIS — F419 Anxiety disorder, unspecified: Secondary | ICD-10-CM

## 2023-11-14 DIAGNOSIS — K219 Gastro-esophageal reflux disease without esophagitis: Secondary | ICD-10-CM

## 2023-11-14 DIAGNOSIS — E66811 Obesity, class 1: Secondary | ICD-10-CM

## 2023-11-14 DIAGNOSIS — E7800 Pure hypercholesterolemia, unspecified: Secondary | ICD-10-CM | POA: Diagnosis not present

## 2023-11-14 DIAGNOSIS — J432 Centrilobular emphysema: Secondary | ICD-10-CM

## 2023-11-14 DIAGNOSIS — R0602 Shortness of breath: Secondary | ICD-10-CM

## 2023-11-14 DIAGNOSIS — I10 Essential (primary) hypertension: Secondary | ICD-10-CM

## 2023-11-14 DIAGNOSIS — I700 Atherosclerosis of aorta: Secondary | ICD-10-CM

## 2023-11-14 DIAGNOSIS — E890 Postprocedural hypothyroidism: Secondary | ICD-10-CM

## 2023-11-14 DIAGNOSIS — G43C1 Periodic headache syndromes in child or adult, intractable: Secondary | ICD-10-CM

## 2023-11-14 DIAGNOSIS — K5909 Other constipation: Secondary | ICD-10-CM | POA: Insufficient documentation

## 2023-11-14 DIAGNOSIS — F32A Depression, unspecified: Secondary | ICD-10-CM

## 2023-11-14 MED ORDER — ASPIRIN 81 MG PO TBEC
81.0000 mg | DELAYED_RELEASE_TABLET | Freq: Every day | ORAL | Status: AC
Start: 2023-11-14 — End: ?

## 2023-11-14 MED ORDER — BREZTRI AEROSPHERE 160-9-4.8 MCG/ACT IN AERO: 2.0000 | INHALATION_SPRAY | Freq: Two times a day (BID) | RESPIRATORY_TRACT | 2 refills | Status: AC

## 2023-11-14 MED ORDER — AMLODIPINE-OLMESARTAN 10-40 MG PO TABS: 1.0000 | ORAL_TABLET | Freq: Every day | ORAL | 1 refills | Status: AC

## 2023-11-14 MED ORDER — ALBUTEROL SULFATE HFA 108 (90 BASE) MCG/ACT IN AERS: 2.0000 | INHALATION_SPRAY | Freq: Four times a day (QID) | RESPIRATORY_TRACT | 2 refills | Status: AC | PRN

## 2023-11-14 MED ORDER — LABETALOL HCL 100 MG PO TABS: 100.0000 mg | ORAL_TABLET | Freq: Two times a day (BID) | ORAL | 1 refills | Status: DC

## 2023-11-14 MED ORDER — OMEPRAZOLE 20 MG PO CPDR: 20.0000 mg | DELAYED_RELEASE_CAPSULE | Freq: Two times a day (BID) | ORAL | 1 refills | Status: AC | PRN

## 2023-11-14 MED ORDER — NORTRIPTYLINE HCL 10 MG PO CAPS: 10.0000 mg | ORAL_CAPSULE | Freq: Every day | ORAL | 1 refills | Status: AC

## 2023-11-14 NOTE — Assessment & Plan Note (Signed)
 Encourage weight loss as this can help reduce reflux symptoms Avoid foods that trigger the reflux Increase omeprazole  to 20 mg twice daily

## 2023-11-14 NOTE — Assessment & Plan Note (Signed)
TSH and free T4 today We will adjust Synthroid if needed based on labs

## 2023-11-14 NOTE — Patient Instructions (Signed)

## 2023-11-14 NOTE — Assessment & Plan Note (Signed)
A1c today Encouraged her to consume a low-carb diet and exercise for weight loss 

## 2023-11-14 NOTE — Assessment & Plan Note (Signed)
C-Met and lipid profile today Encouraged her to consume a low-fat diet She had previously refused statin therapy but will reconsider if LDL >100

## 2023-11-14 NOTE — Assessment & Plan Note (Signed)
 Lipid profile today Will consider statin therapy if LDL >70 Encouraged her to start baby aspirin  daily Encouraged her to consume a low-fat diet

## 2023-11-14 NOTE — Progress Notes (Signed)
 Subjective:    Patient ID: Gina Wells, female    DOB: 19-May-1961, 63 y.o.   MRN: [id number]  HPI  Patient presents to clinic today for follow-up of chronic conditions.  HTN: Her BP today is 124/78.  She is taking amlodipine -olmesartan , and labetalol  as prescribed.  ECG from 10/2022 reviewed.  Migraines: These occur a few times per month. Triggered by stress. She is not taking any preventative medications for headaches, but she takes goody's powder daily. She does not follow with neurology.  COPD: She reports chronic cough and SOB.  She is taking breztri  and albuterol  as prescribed.  There are no PFTs on file.  She does not follow with pulmonology.  She does continue to smoke  GERD: She is not sure what triggers this. She has occassional breakthrough on omeprazole .  There is no upper GI on file.  Chronic constipation: She only takes linzess  as needed. She follows with GI.   Hypothyroidism: Post ablative.  She denies any issues on her current dose of levothyroxine .  She does not follow with endocrinology.  HLD with aortic atherosclerosis: Her last LDL was 130, triglycerides 78, 08/2022.  She is not taking any cholesterol-lowering medication or aspirin  at this time.  She tries to consume a low-fat diet.  Anxiety and depression: Chronic, but she is not currently taking any medications for this. She is not currently seeing a therapist.  She denies SI/HI.  Prediabetes: Her last A1c was 6.2%, 08/2022.  She is not taking any oral diabetic medication at this time.  She does not check her sugars.  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 18 g 0   amLODipine -olmesartan  (AZOR ) 10-40 MG tablet Take 1 tablet by mouth once daily 90 tablet 0   Aspirin -Salicylamide-Caffeine (BC HEADACHE POWDER PO) Take by mouth 3 (three) times daily as needed.     bismuth  subsalicylate (PEPTO BISMOL) 262 MG chewable tablet Chew 2 tablets 4 times a day for 14 days 112 tablet 0   BREZTRI  AEROSPHERE 160-9-4.8 MCG/ACT AERO INHALE 2 PUFFS TWICE DAILY 33 g 0   labetalol  (NORMODYNE ) 100 MG tablet Take 1 tablet (100 mg total) by mouth 3 (three) times daily. 270 tablet 0   linaclotide  (LINZESS ) 72 MCG capsule Take 1 capsule (72 mcg total) by mouth daily as needed. 30 capsule 0   omeprazole  (PRILOSEC) 20 MG capsule Take 1 capsule (20 mg total) by mouth 2 (two) times daily before a meal. (Patient taking differently: Take 20 mg by mouth as needed.) 180 capsule 1   omeprazole  (PRILOSEC) 20 MG capsule Take 1 capsule (20 mg total) by mouth 2 (two) times daily before a meal for 14 days. 28 capsule 0   potassium chloride  SA (KLOR-CON  M) 20 MEQ tablet Take 1 tablet (20 mEq total) by mouth daily. 90 tablet 0   SYNTHROID  200 MCG tablet TAKE 1 TABLET BY MOUTH ONCE DAILY BEFORE BREAKFAST 90 tablet 0   torsemide  (  DEMADEX ) 20 MG tablet Take 1 tablet (20 mg total) by mouth daily. 90 tablet 0   No current facility-administered medications for this visit.    No Known Allergies  Family History  Problem Relation Age of Onset   Diabetes Mother    Alcohol abuse Father    Diabetes Father    Cancer Father    Cancer Paternal Aunt    Alcohol abuse Brother     Social History   Socioeconomic History   Marital status: Single    Spouse name: Not on file   Number of children: Not on file   Years of education: Not on file   Highest education level: GED or equivalent  Occupational History   Not on file  Tobacco Use   Smoking status: Every Day    Current packs/day:  0.25    Average packs/day: 0.3 packs/day for 50.0 years (12.5 ttl pk-yrs)    Types: Cigarettes    Start date: 07/25/1975   Smokeless tobacco: Never  Vaping Use   Vaping status: Never Used  Substance and Sexual Activity   Alcohol use: No    Alcohol/week: 0.0 standard drinks of alcohol   Drug use: No   Sexual activity: Yes  Other Topics Concern   Not on file  Social History Narrative   Not on file   Social Drivers of Health   Financial Resource Strain: Medium Risk (04/18/2023)   Overall Financial Resource Strain (CARDIA)    Difficulty of Paying Living Expenses: Somewhat hard  Food Insecurity: No Food Insecurity (04/18/2023)   Hunger Vital Sign    Worried About Running Out of Food in the Last Year: Never true    Ran Out of Food in the Last Year: Never true  Transportation Needs: No Transportation Needs (04/18/2023)   PRAPARE - Administrator, Civil Service (Medical): No    Lack of Transportation (Non-Medical): No  Physical Activity: Sufficiently Active (04/18/2023)   Exercise Vital Sign    Days of Exercise per Week: 4 days    Minutes of Exercise per Session: 40 min  Stress: No Stress Concern Present (04/18/2023)   [employer] of Occupational Health - Occupational Stress Questionnaire    Feeling of Stress : Only a little  Social Connections: Moderately Integrated (04/18/2023)   Social Connection and Isolation Panel [NHANES]    Frequency of Communication with Friends and Family: More than three times a week    Frequency of Social Gatherings with Friends and Family: More than three times a week    Attends Religious Services: More than 4 times per year    Active Member of [employer] or Organizations: Yes    Attends Engineer, structural: More than 4 times per year    Marital Status: Divorced  Catering manager Violence: Not on file     Constitutional: Patient reports intermittent headaches.  Denies fever, malaise, fatigue, or abrupt weight changes.  HEENT: Denies  eye pain, eye redness, ear pain, ringing in the ears, wax buildup, runny nose, nasal congestion, bloody nose, or sore throat. Respiratory: Patient reports chronic cough and shortness of breath.  Denies difficulty breathing.   Cardiovascular: Denies chest pain, chest tightness, palpitations or swelling in the hands or feet.  Gastrointestinal: Patient reports constipation.  Denies abdominal pain,bloating, diarrhea or blood in the stool.  GU: Denies urgency, frequency, pain with urination, burning sensation, blood in urine, odor or discharge. Musculoskeletal: Denies decrease in range of motion, difficulty with gait, muscle pain or joint pain  and swelling.  Skin: Denies redness, rashes, lesions or ulcercations.  Neurological: Denies dizziness, difficulty with memory, difficulty with speech or problems with balance and coordination.  Psych: Patient has a history of anxiety and depression.  Denies SI/HI.  No other specific complaints in a complete review of systems (except as listed in HPI above).  Objective:   Physical Exam  BP 124/78 (BP Location: Right Arm, Patient Position: Sitting, Cuff Size: Normal)   Ht 5\' 4"  (1.626 m)   Wt 196 lb 12.8 oz (89.3 kg)   BMI 33.78 kg/m   Wt Readings from Last 3 Encounters:  04/11/23 200 lb 3.2 oz (90.8 kg)  02/23/23 195 lb 6 oz (88.6 kg)  01/15/23 199 lb (90.3 kg)    General: Appears her stated age, obese, in NAD. Skin: Warm, dry and intact.  HEENT: Head: normal shape and size; Eyes: sclera white, no icterus, conjunctiva pink, PERRLA and EOMs intact;  Neck:  Neck supple, trachea midline. No masses, lumps or thyromegaly present.  Cardiovascular: Normal rate and rhythm. S1,S2 noted.  No murmur, rubs or gallops noted. No JVD or BLE edema. No carotid bruits noted. Pulmonary/Chest: Normal effort and positive vesicular breath sounds. No respiratory distress. No wheezes, rales or ronchi noted.  Abdomen: Normal bowel sounds.  Musculoskeletal: No difficulty  with gait.  Neurological: Alert and oriented.  Psychiatric: Mood and affect normal. Behavior is normal. Judgment and thought content normal.    BMET    Component Value Date/Time   NA 137 01/10/2023 0026   NA 140 03/08/2017 1909   K 4.6 01/10/2023 0026   CL 102 01/10/2023 0026   CO2 26 01/10/2023 0026   GLUCOSE 96 01/10/2023 0026   BUN 28 (H) 01/10/2023 0026   BUN 10 03/08/2017 1909   CREATININE 1.09 (H) 01/10/2023 0026   CREATININE 0.78 10/18/2022 0842   CALCIUM  8.5 (L) 01/10/2023 0026   GFRNONAA 58 (L) 01/10/2023 0026   GFRNONAA 59 (L) 02/02/2020 1142   GFRAA 69 02/02/2020 1142    Lipid Panel     Component Value Date/Time   CHOL 219 (H) 09/13/2022 0829   CHOL 233 (H) 03/08/2017 1909   TRIG 78 09/13/2022 0829   HDL 72 09/13/2022 0829   HDL 62 03/08/2017 1909   CHOLHDL 3.0 09/13/2022 0829   VLDL 9 05/19/2018 1549   LDLCALC 130 (H) 09/13/2022 0829    CBC    Component Value Date/Time   WBC 10.3 01/10/2023 0026   RBC 4.39 01/10/2023 0026   HGB 13.0 01/10/2023 0026   HGB 13.0 03/08/2017 1909   HCT 41.2 01/10/2023 0026   HCT 39.8 03/08/2017 1909   PLT 236 01/10/2023 0026   PLT 271 03/08/2017 1909   MCV 93.8 01/10/2023 0026   MCV 89 03/08/2017 1909   MCH 29.6 01/10/2023 0026   MCHC 31.6 01/10/2023 0026   RDW 13.7 01/10/2023 0026   RDW 14.4 03/08/2017 1909   LYMPHSABS 3.9 01/10/2023 0026   LYMPHSABS 3.2 (H) 01/19/2015 0808   MONOABS 0.7 01/10/2023 0026   EOSABS 0.1 01/10/2023 0026   EOSABS 0.1 01/19/2015 0808   BASOSABS 0.1 01/10/2023 0026   BASOSABS 0.0 01/19/2015 0808    Hgb A1C Lab Results  Component Value Date   HGBA1C 6.2 (H) 09/13/2022           Assessment & Plan:   RTC in 6 months for your annual exam Helayne Lo, NP

## 2023-11-14 NOTE — Assessment & Plan Note (Signed)
 Encourage high-fiber diet and adequate water intake Advised her it was okay to take linzess  daily

## 2023-11-14 NOTE — Assessment & Plan Note (Signed)
 Continue amlodipine -olmesartan  and labetalol  Reinforced DASH diet and exercise for weight loss C-Met today

## 2023-11-14 NOTE — Assessment & Plan Note (Signed)
 Encourage diet and exercise for weight loss

## 2023-11-14 NOTE — Assessment & Plan Note (Signed)
 Persistent Will start nortriptyline  10 mg at bedtime Try to identify triggers and avoid

## 2023-11-14 NOTE — Assessment & Plan Note (Signed)
Not medicated Support offered 

## 2023-11-14 NOTE — Assessment & Plan Note (Signed)
 Continue breztri  and albuterol  as needed Encourage smoking cessation

## 2023-11-15 ENCOUNTER — Other Ambulatory Visit: Payer: Self-pay | Admitting: Internal Medicine

## 2023-11-15 ENCOUNTER — Telehealth: Payer: Self-pay

## 2023-11-15 ENCOUNTER — Encounter: Payer: Self-pay | Admitting: Internal Medicine

## 2023-11-15 DIAGNOSIS — E7800 Pure hypercholesterolemia, unspecified: Secondary | ICD-10-CM

## 2023-11-15 DIAGNOSIS — E890 Postprocedural hypothyroidism: Secondary | ICD-10-CM

## 2023-11-15 LAB — HEMOGLOBIN A1C
Hgb A1c MFr Bld: 6.4 % — ABNORMAL HIGH (ref <5.7)
Mean Plasma Glucose: 137 mg/dL
eAG (mmol/L): 7.6 mmol/L

## 2023-11-15 LAB — COMPREHENSIVE METABOLIC PANEL WITH GFR
AG Ratio: 1.6 (calc) (ref 1.0–2.5)
ALT: 11 U/L (ref 6–29)
AST: 11 U/L (ref 10–35)
Albumin: 4.1 g/dL (ref 3.6–5.1)
Alkaline phosphatase (APISO): 133 U/L (ref 37–153)
BUN: 18 mg/dL (ref 7–25)
CO2: 26 mmol/L (ref 20–32)
Calcium: 10 mg/dL (ref 8.6–10.4)
Chloride: 108 mmol/L (ref 98–110)
Creat: 0.83 mg/dL (ref 0.50–1.05)
Globulin: 2.5 g/dL (ref 1.9–3.7)
Glucose, Bld: 104 mg/dL — ABNORMAL HIGH (ref 65–99)
Potassium: 4.1 mmol/L (ref 3.5–5.3)
Sodium: 142 mmol/L (ref 135–146)
Total Bilirubin: 0.5 mg/dL (ref 0.2–1.2)
Total Protein: 6.6 g/dL (ref 6.1–8.1)
eGFR: 80 mL/min/{1.73_m2} (ref >=60)

## 2023-11-15 LAB — CBC
HCT: 40.2 % (ref 35.0–45.0)
Hemoglobin: 13 g/dL (ref 11.7–15.5)
MCH: 28.9 pg (ref 27.0–33.0)
MCHC: 32.3 g/dL (ref 32.0–36.0)
MCV: 89.3 fL (ref 80.0–100.0)
MPV: 10.1 fL (ref 7.5–12.5)
Platelets: 243 10*3/uL (ref 140–400)
RBC: 4.5 10*6/uL (ref 3.80–5.10)
RDW: 13.7 % (ref 11.0–15.0)
WBC: 10.2 10*3/uL (ref 3.8–10.8)

## 2023-11-15 LAB — LIPID PANEL
Cholesterol: 226 mg/dL — ABNORMAL HIGH (ref <200)
HDL: 67 mg/dL (ref >=50)
LDL Cholesterol (Calc): 142 mg/dL — ABNORMAL HIGH
Non-HDL Cholesterol (Calc): 159 mg/dL — ABNORMAL HIGH (ref <130)
Total CHOL/HDL Ratio: 3.4 (calc) (ref <5.0)
Triglycerides: 77 mg/dL (ref <150)

## 2023-11-15 LAB — T4, FREE: Free T4: 1.1 ng/dL (ref 0.8–1.8)

## 2023-11-15 LAB — TSH: TSH: 12.4 m[IU]/L — ABNORMAL HIGH (ref 0.40–4.50)

## 2023-11-15 MED ORDER — SYNTHROID 25 MCG PO TABS: 25.0000 ug | ORAL_TABLET | Freq: Every day | ORAL | 0 refills | Status: AC

## 2023-11-15 MED ORDER — ATORVASTATIN CALCIUM 10 MG PO TABS: 10.0000 mg | ORAL_TABLET | Freq: Every day | ORAL | 1 refills | Status: AC

## 2023-11-15 MED ORDER — SYNTHROID 200 MCG PO TABS: 200.0000 ug | ORAL_TABLET | Freq: Every day | ORAL | 1 refills | Status: DC

## 2023-11-15 NOTE — Telephone Encounter (Signed)
 Patient advised and verbalized understanding

## 2023-11-15 NOTE — Telephone Encounter (Signed)
 I have sent in atorvastatin  for her.  They do not make a 225 mcg Synthroid  tablet so I had to call in a 25 mcg tablet that she will take in addition to her 200 mcg tablet for a total of 225 mcg.  I would like her to repeat thyroid  and cholesterol labs in 3 months, lab only.

## 2023-11-15 NOTE — Telephone Encounter (Signed)
 Copied from CRM [id number]. Topic: Clinical - Medication Question >> Nov 15, 2023  9:25 AM Gina Wells wrote: Reason for CRM: Patient is calling in because she says Helayne Lo wanted to change her medication SYNTHROID  to 225 MG from 200 and she wanted to let her know that was fine with her. Patient wants to make sure that the medication is SYNTHROID  and not the generic version.

## 2023-11-15 NOTE — Addendum Note (Signed)
 Addended by: Carollynn Cirri on: 11/15/2023 10:08 AM   Modules accepted: Orders

## 2023-11-15 NOTE — Addendum Note (Signed)
 Addended by: Arabella Knife D on: 11/15/2023 10:18 AM   Modules accepted: Orders

## 2023-11-16 NOTE — Telephone Encounter (Signed)
 No  e- confirmation for labetalol  at pharmacy, will resend. Linzess , Patient not taking: Reported on 11/14/2023.  Requested Prescriptions  Pending Prescriptions Disp Refills   labetalol  (NORMODYNE ) 100 MG tablet [Pharmacy Med Name: Labetalol  HCl 100 MG Oral Tablet] 180 tablet 0    Sig: Take 1 tablet by mouth twice daily     Cardiovascular:  Beta Blockers Failed - 11/16/2023 11:22 AM      Failed - Valid encounter within last 6 months    Recent Outpatient Visits           2 days ago Pure hypercholesterolemia   [facility] [place], [place] W, NP              Passed - Last BP in normal range    BP Readings from Last 1 Encounters:  11/14/23 124/78         Passed - Last Heart Rate in normal range    Pulse Readings from Last 1 Encounters:  04/11/23 60          LINZESS  72 MCG capsule [Pharmacy Med Name: Linzess  72 MCG Oral Capsule] 30 capsule 0    Sig: TAKE 1 CAPSULE BY MOUTH ONCE DAILY AS NEEDED     Gastroenterology: Irritable Bowel Syndrome Failed - 11/16/2023 11:22 AM      Failed - Valid encounter within last 12 months    Recent Outpatient Visits           2 days ago Pure hypercholesterolemia    [facility] [place], [place], [place]

## 2023-12-19 ENCOUNTER — Encounter: Payer: Self-pay | Admitting: Ophthalmology

## 2023-12-19 NOTE — Anesthesia Preprocedure Evaluation (Addendum)
 Anesthesia Evaluation  Patient identified by MRN, date of birth, ID band Patient awake    Reviewed: Allergy & Precautions, H&P , NPO status , Patient's Chart, lab work & pertinent test results  Airway Mallampati: III  TM Distance: >3 FB Neck ROM: Full  Mouth opening: Limited Mouth Opening  Dental no notable dental hx.    Pulmonary COPD, Current Smoker and Patient abstained from smoking. Smokes 1/2 PPD, smoked "in the night but not this  morning"   Pulmonary exam normal breath sounds clear to auscultation       Cardiovascular hypertension, Normal cardiovascular exam Rhythm:Regular Rate:Normal  01-01-19 echoINTERPRETATION  NORMAL LEFT VENTRICULAR SYSTOLIC FUNCTION  NORMAL RIGHT VENTRICULAR SYSTOLIC FUNCTION  TRIVIAL REGURGITATION NOTED  GRADE I DIASTOLIC DYSFUNCTION NO VALVULAR STENOSIS  Normal right heart pressures      Neuro/Psych  Headaches PSYCHIATRIC DISORDERS Anxiety Depression    negative neurological ROS  negative psych ROS   GI/Hepatic negative GI ROS, Neg liver ROS,GERD  ,,  Endo/Other  negative endocrine ROSHypothyroidism Hyperthyroidism   Renal/GU negative Renal ROS  negative genitourinary   Musculoskeletal negative musculoskeletal ROS (+)    Abdominal   Peds negative pediatric ROS (+)  Hematology negative hematology ROS (+)   Anesthesia Other Findings Hx vitrectomy   Hx colonoscopy 04-11-23 Dr. 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 Depression Anxiety  Fatigue Anxiety  Hypertension COPD (chronic obstructive pulmonary disease) (HCC)  GERD (gastroesophageal reflux disease) History of Helicobacter pylori infection  Hx of gastric ulcer Substance abuse (HCC) ] Alcohol abuse History of kidney stones   Extremely anxious, will administer versed 2 mg IV preop    Reproductive/Obstetrics negative OB ROS                             Anesthesia Physical Anesthesia Plan  ASA: 3  Anesthesia Plan: MAC   Post-op Pain Management:    Induction: Intravenous  PONV Risk Score and Plan:   Airway Management Planned: Natural Airway and Nasal Cannula  Additional Equipment:   Intra-op Plan:   Post-operative Plan:   Informed Consent: I have reviewed the patients History and Physical, chart, labs and discussed the procedure including the risks, benefits and alternatives for the proposed anesthesia with the patient or authorized representative who has indicated his/her understanding and acceptance.     Dental Advisory Given  Plan Discussed with: Anesthesiologist, CRNA and Surgeon  Anesthesia Plan Comments: (Patient consented for risks of anesthesia including but not limited to:  - adverse reactions to medications - damage to eyes, teeth, lips or other oral mucosa - nerve damage due to positioning  - sore throat or hoarseness - Damage to heart, brain, nerves, lungs, other parts of body or loss of life  Patient voiced understanding and assent.)        Anesthesia Quick Evaluation

## 2023-12-25 NOTE — Discharge Instructions (Signed)

## 2023-12-26 ENCOUNTER — Other Ambulatory Visit: Payer: Self-pay

## 2023-12-26 ENCOUNTER — Ambulatory Visit: Payer: Self-pay | Admitting: Anesthesiology

## 2023-12-26 ENCOUNTER — Encounter: Payer: Self-pay | Admitting: Ophthalmology

## 2023-12-26 ENCOUNTER — Encounter
Admission: RE | Disposition: A | Discharge status: Home / Self Care | Payer: Self-pay | Source: Home / Self Care | Attending: Ophthalmology

## 2023-12-26 ENCOUNTER — Ambulatory Visit
Admission: RE | Admit: 2023-12-26 | Discharge: 2023-12-26 | Disposition: A | Discharge status: Home / Self Care | Attending: Ophthalmology | Admitting: Ophthalmology

## 2023-12-26 DIAGNOSIS — H2511 Age-related nuclear cataract, right eye: Secondary | ICD-10-CM | POA: Diagnosis present

## 2023-12-26 DIAGNOSIS — F32A Depression, unspecified: Secondary | ICD-10-CM | POA: Diagnosis not present

## 2023-12-26 DIAGNOSIS — E0590 Thyrotoxicosis, unspecified without thyrotoxic crisis or storm: Secondary | ICD-10-CM | POA: Insufficient documentation

## 2023-12-26 DIAGNOSIS — K219 Gastro-esophageal reflux disease without esophagitis: Secondary | ICD-10-CM | POA: Insufficient documentation

## 2023-12-26 DIAGNOSIS — J449 Chronic obstructive pulmonary disease, unspecified: Secondary | ICD-10-CM | POA: Insufficient documentation

## 2023-12-26 DIAGNOSIS — Z7951 Long term (current) use of inhaled steroids: Secondary | ICD-10-CM | POA: Insufficient documentation

## 2023-12-26 DIAGNOSIS — F419 Anxiety disorder, unspecified: Secondary | ICD-10-CM | POA: Diagnosis not present

## 2023-12-26 DIAGNOSIS — Z79899 Other long term (current) drug therapy: Secondary | ICD-10-CM | POA: Insufficient documentation

## 2023-12-26 DIAGNOSIS — I10 Essential (primary) hypertension: Secondary | ICD-10-CM | POA: Diagnosis not present

## 2023-12-26 DIAGNOSIS — F17210 Nicotine dependence, cigarettes, uncomplicated: Secondary | ICD-10-CM | POA: Diagnosis not present

## 2023-12-26 DIAGNOSIS — R519 Headache, unspecified: Secondary | ICD-10-CM | POA: Diagnosis not present

## 2023-12-26 HISTORY — DX: Gastro-esophageal reflux disease without esophagitis: K21.9

## 2023-12-26 HISTORY — DX: Obesity, class 2: E66.812

## 2023-12-26 HISTORY — DX: Personal history of peptic ulcer disease: Z87.11

## 2023-12-26 HISTORY — DX: Personal history of other infectious and parasitic diseases: Z86.19

## 2023-12-26 HISTORY — DX: Other ill-defined heart diseases: I51.89

## 2023-12-26 HISTORY — DX: Alcohol abuse, uncomplicated: F10.10

## 2023-12-26 HISTORY — DX: Other psychoactive substance abuse, uncomplicated: F19.10

## 2023-12-26 HISTORY — DX: Personal history of urinary calculi: Z87.442

## 2023-12-26 HISTORY — PX: CATARACT EXTRACTION W/PHACO: SHX586

## 2023-12-26 SURGERY — PHACOEMULSIFICATION, CATARACT, WITH IOL INSERTION
Anesthesia: Monitor Anesthesia Care | Site: Eye | Laterality: Right

## 2023-12-26 MED ORDER — TETRACAINE HCL 0.5 % OP SOLN
1.0000 [drp] | OPHTHALMIC | Status: DC | PRN
  Administered 2023-12-26 (×3): 1 [drp] via OPHTHALMIC

## 2023-12-26 MED ORDER — MIDAZOLAM HCL 2 MG/2ML IJ SOLN
INTRAMUSCULAR | Status: DC | PRN
  Administered 2023-12-26: 2 mg via INTRAVENOUS

## 2023-12-26 MED ORDER — FENTANYL CITRATE (PF) 100 MCG/2ML IJ SOLN
INTRAMUSCULAR | Status: DC | PRN
Start: 2023-12-26 — End: 2023-12-26
  Administered 2023-12-26: 50 ug via INTRAVENOUS

## 2023-12-26 MED ORDER — ARMC OPHTHALMIC DILATING DROPS
OPHTHALMIC | Status: AC
Start: 2023-12-26 — End: ?
  Filled 2023-12-26: qty 0.5

## 2023-12-26 MED ORDER — MIDAZOLAM HCL 2 MG/2ML IJ SOLN
INTRAMUSCULAR | Status: AC
  Filled 2023-12-26: qty 2

## 2023-12-26 MED ORDER — BRIMONIDINE TARTRATE-TIMOLOL 0.2-0.5 % OP SOLN
OPHTHALMIC | Status: DC | PRN
  Administered 2023-12-26: 1 [drp] via OPHTHALMIC

## 2023-12-26 MED ORDER — SIGHTPATH DOSE#1 BSS IO SOLN
INTRAOCULAR | Status: DC | PRN
  Administered 2023-12-26: 15 mL via INTRAOCULAR

## 2023-12-26 MED ORDER — MIDAZOLAM HCL 2 MG/2ML IJ SOLN
2.0000 mg | Freq: Once | INTRAMUSCULAR | Status: AC
  Administered 2023-12-26: 2 mg via INTRAVENOUS

## 2023-12-26 MED ORDER — ARMC OPHTHALMIC DILATING DROPS
1.0000 | OPHTHALMIC | Status: DC | PRN
  Administered 2023-12-26 (×3): 1 via OPHTHALMIC

## 2023-12-26 MED ORDER — LACTATED RINGERS IV SOLN: INTRAVENOUS | Status: DC

## 2023-12-26 MED ORDER — EPINEPHRINE PF 1 MG/ML IJ SOLN
INTRAMUSCULAR | Status: DC | PRN
  Administered 2023-12-26: 58 mL via OPHTHALMIC

## 2023-12-26 MED ORDER — CEFUROXIME OPHTHALMIC INJECTION 1 MG/0.1 ML
INJECTION | OPHTHALMIC | Status: DC | PRN
  Administered 2023-12-26: .1 mL via INTRACAMERAL

## 2023-12-26 MED ORDER — FENTANYL CITRATE (PF) 100 MCG/2ML IJ SOLN
INTRAMUSCULAR | Status: AC
  Filled 2023-12-26: qty 2

## 2023-12-26 MED ORDER — LIDOCAINE HCL (PF) 2 % IJ SOLN
INTRAOCULAR | Status: DC | PRN
  Administered 2023-12-26: 2 mL

## 2023-12-26 MED ORDER — TETRACAINE HCL 0.5 % OP SOLN
OPHTHALMIC | Status: AC
  Filled 2023-12-26: qty 4

## 2023-12-26 MED ORDER — SIGHTPATH DOSE#1 NA HYALUR & NA CHOND-NA HYALUR IO KIT
PACK | INTRAOCULAR | Status: DC | PRN
  Administered 2023-12-26: 1 via OPHTHALMIC

## 2023-12-26 SURGICAL SUPPLY — 10 items
CATARACT SUITE SIGHTPATH (MISCELLANEOUS) ×1 IMPLANT
FEE CATARACT SUITE SIGHTPATH (MISCELLANEOUS) ×2 IMPLANT
GLOVE BIOGEL PI IND STRL 8 (GLOVE) ×2 IMPLANT
GLOVE SURG LX STRL 7.5 STRW (GLOVE) ×2 IMPLANT
GLOVE SURG PROTEXIS BL SZ6.5 (GLOVE) ×1 IMPLANT
GLOVE SURG SYN 6.5 PF PI BL (GLOVE) ×2 IMPLANT
LENS IOL TECNIS EYHANCE 10.0 (Intraocular Lens) IMPLANT
NDL FILTER BLUNT 18X1 1/2 (NEEDLE) ×2 IMPLANT
NEEDLE FILTER BLUNT 18X1 1/2 (NEEDLE) ×1 IMPLANT
SYR 3ML LL SCALE MARK (SYRINGE) ×2 IMPLANT

## 2023-12-26 NOTE — Anesthesia Postprocedure Evaluation (Signed)
 Anesthesia Post Note  Patient: Gina Wells  Procedure(s) Performed: PHACOEMULSIFICATION, CATARACT, WITH IOL INSERTION 4.87 00:45.4 (Right: Eye)  Patient location during evaluation: PACU Anesthesia Type: MAC Level of consciousness: awake and alert Pain management: pain level controlled Vital Signs Assessment: post-procedure vital signs reviewed and stable Respiratory status: spontaneous breathing, nonlabored ventilation, respiratory function stable and patient connected to nasal cannula oxygen  Cardiovascular status: stable and blood pressure returned to baseline Postop Assessment: no apparent nausea or vomiting Anesthetic complications: no   No notable events documented.   Last Vitals:  Vitals:   12/26/23 0928 12/26/23 0934  BP: 135/80 (!) 144/89  Pulse: 70 69  Resp: 19 19  Temp: 36.7 C   SpO2: 99% 96%    Last Pain:  Vitals:   12/26/23 0934  TempSrc:   PainSc: 0-No pain                 Elihu Grumet Kalan Rinn

## 2023-12-26 NOTE — H&P (Signed)
 [facility]   Primary Care Physician:  Carollynn Cirri, NP Ophthalmologist: Dr. Annell Kidney  Pre-Procedure History & Physical: HPI:  Gina Wells is a 63 y.o. female here for ophthalmic surgery.   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     Past Surgical History:  Procedure Laterality Date   BIOPSY  04/11/2023   Procedure: BIOPSY;  Surgeon: Luke Salaam, MD;  Location: [facility] ENDOSCOPY;  Service: Gastroenterology;;   COLONOSCOPY WITH PROPOFOL  N/A 04/11/2023   Procedure: COLONOSCOPY WITH PROPOFOL ;  Surgeon: Luke Salaam, MD;  Location: [facility] ENDOSCOPY;  Service: Gastroenterology;  Laterality: N/A;   ESOPHAGOGASTRODUODENOSCOPY (EGD) WITH PROPOFOL  N/A 04/11/2023   Procedure: ESOPHAGOGASTRODUODENOSCOPY (EGD) WITH PROPOFOL ;  Surgeon: Luke Salaam, MD;  Location: [facility] ENDOSCOPY;  Service: Gastroenterology;  Laterality: N/A;   HEMOSTASIS CLIP PLACEMENT  04/11/2023   Procedure: HEMOSTASIS CLIP PLACEMENT;  Surgeon: Luke Salaam, MD;  Location: [facility] ENDOSCOPY;  Service: Gastroenterology;;   KIDNEY STONE SURGERY     POLYPECTOMY  04/11/2023   Procedure: POLYPECTOMY;  Surgeon: Luke Salaam, MD;  Location: [facility] ENDOSCOPY;  Service: Gastroenterology;;   URETERAL STENT PLACEMENT     2002 for kidney stones   VAGINAL HYSTERECTOMY      Prior to Admission medications   Medication Sig Start Date End Date Taking? Authorizing Provider  albuterol  (VENTOLIN  HFA) 108 (90 Base) MCG/ACT inhaler Inhale 2 puffs into the lungs every 6 (six)  hours as needed for wheezing or shortness of breath. 11/14/23  Yes Carollynn Cirri, NP  amLODipine -olmesartan  (AZOR ) 10-40 MG tablet Take 1 tablet by mouth daily. 11/14/23  Yes Baity, [place], NP  Aspirin -Salicylamide-Caffeine (BC HEADACHE POWDER PO) Take by mouth 3 (three) times daily as needed.   Yes [provider]  bismuth  subsalicylate (PEPTO BISMOL) 262 MG chewable tablet Chew 2 tablets 4 times a day for 14 days 04/13/23  Yes Luke Salaam, MD  budeson-glycopyrrolate-formoterol  (BREZTRI  AEROSPHERE) 160-9-4.8 MCG/ACT AERO inhaler Inhale 2 puffs into the lungs 2 (two) times daily. 11/14/23  Yes Carollynn Cirri, NP  labetalol  (NORMODYNE ) 100 MG tablet Take 1 tablet by mouth twice daily 11/16/23  Yes Baity, [place], NP  linaclotide  (LINZESS ) 72 MCG capsule Take 1 capsule (72 mcg total) by mouth daily as needed. 01/15/23  Yes Carollynn Cirri, NP  nortriptyline  (PAMELOR ) 10 MG capsule Take 1 capsule (10 mg total) by mouth at bedtime. Patient taking differently: Take 10 mg by mouth at bedtime as needed. 11/14/23  Yes Baity, [place], NP  omeprazole  (PRILOSEC) 20 MG capsule Take 1 capsule (20 mg total) by mouth 2 (two) times daily as needed. 11/14/23 05/12/24 Yes Carollynn Cirri, NP  SYNTHROID  200 MCG tablet Take 1 tablet (200 mcg total) by mouth daily before breakfast. 11/15/23  Yes Baity, [place], NP  SYNTHROID  25 MCG tablet Take 1 tablet (25 mcg total) by mouth daily before breakfast. 11/15/23  Yes Carollynn Cirri, NP  aspirin  EC 81 MG tablet Take 1 tablet (81 mg total) by mouth daily. Swallow whole. Patient not taking: Reported on 12/19/2023 11/14/23   Carollynn Cirri, NP  atorvastatin  (LIPITOR) 10 MG tablet Take 1 tablet (10 mg total) by mouth daily. Patient not taking: Reported on 12/19/2023 11/15/23   Carollynn Cirri, NP  omeprazole  (PRILOSEC) 20 MG capsule Take 1 capsule (20 mg total) by mouth 2 (two) times daily before a meal. Patient taking differently: Take 20 mg by mouth as needed. 02/16/20    Malfi, Judee Norway, FNP    Allergies as of 12/11/2023   (No Known Allergies)    Family History  Problem Relation Age of Onset   Diabetes Mother    Alcohol abuse Father    Diabetes Father    Cancer Father    Cancer Paternal Aunt    Alcohol abuse Brother     Social History   Socioeconomic History   Marital status: Single    Spouse name: Not on file   Number of children: Not on file   Years of education: Not on file   Highest education level: GED or equivalent  Occupational History   Not on file  Tobacco Use   Smoking status: Every Day    Current packs/day: 0.25    Average packs/day: 0.3 packs/day for 50.1 years (12.5 ttl pk-yrs)    Types: Cigarettes    Start date: 07/25/1975   Smokeless tobacco: Never  Vaping Use   Vaping status: Never Used  Substance and Sexual Activity   Alcohol use: No    Alcohol/week: 0.0 standard drinks of alcohol   Drug use: No   Sexual activity: Yes  Other Topics Concern   Not on file  Social History Narrative   Not on file   Social Drivers of Health   Financial Resource Strain: Medium Risk (04/18/2023)   Overall Financial Resource Strain (CARDIA)    Difficulty of Paying Living Expenses: Somewhat hard  Food Insecurity: No Food Insecurity (04/18/2023)   Hunger Vital Sign    Worried About Running Out of Food in the Last Year: Never true    Ran Out of Food in the Last Year: Never true  Transportation Needs: No Transportation Needs (04/18/2023)   PRAPARE - Administrator, Civil Service (Medical): No    Lack of Transportation (Non-Medical): No  Physical Activity: Sufficiently Active (04/18/2023)   Exercise Vital Sign    Days of Exercise per Week: 4 days    Minutes of Exercise per Session: 40 min  Stress: No Stress Concern Present (04/18/2023)   [employer] of Occupational Health - Occupational Stress Questionnaire    Feeling of Stress : Only a little  Social Connections: Moderately Integrated (04/18/2023)   Social Connection  and Isolation Panel [NHANES]    Frequency of Communication with Friends and Family: More than three times a week    Frequency of Social Gatherings with Friends and Family: More than three times a week    Attends Religious Services: More than 4 times per year    Active Member of [employer] or Organizations: Yes    Attends Engineer, structural: More than 4 times per year    Marital Status: Divorced  Catering manager Violence: Not on file    Review of Systems: See HPI, otherwise negative ROS  Physical Exam: BP (!) 147/81   Pulse 69   Temp (!) 97.4 F (36.3 C) (Temporal)   Resp  15   Ht 5\' 4"  (1.626 m)   Wt 90.3 kg   SpO2 100%   BMI 34.16 kg/m  General:   Alert,  pleasant and cooperative in NAD Head:  Normocephalic and atraumatic. Lungs:  Clear to auscultation.    Heart:  Regular rate and rhythm.   Impression/Plan: Gina Wells is here for ophthalmic surgery.  Risks, benefits, limitations, and alternatives regarding ophthalmic surgery have been reviewed with the patient.  Questions have been answered.  All parties agreeable.   Annell Kidney, MD  12/26/2023, 8:41 AM

## 2023-12-26 NOTE — Op Note (Signed)
 LOCATION:  [facility]   PREOPERATIVE DIAGNOSIS:    Nuclear sclerotic cataract right eye. H25.11   POSTOPERATIVE DIAGNOSIS:  Nuclear sclerotic cataract right eye.     PROCEDURE:  Phacoemusification with posterior chamber intraocular lens placement of the right eye   ULTRASOUND TIME: Procedure(s): PHACOEMULSIFICATION, CATARACT, WITH IOL INSERTION 4.87 00:45.4 (Right)  LENS:   Implant Name Type Inv. Item Serial No. Manufacturer Lot No. LRB No. Used Action  LENS IOL TECNIS EYHANCE 10.0 - [id number] Intraocular Lens LENS IOL TECNIS EYHANCE 10.0 [id number] [employer]  Right 1 Implanted         SURGEON:  Berline Brenner, MD   ANESTHESIA:  Topical with tetracaine drops and 2% Xylocaine  jelly, augmented with 1% preservative-free intracameral lidocaine .    COMPLICATIONS:  None.   DESCRIPTION OF PROCEDURE:  The patient was identified in the holding room and transported to the operating room and placed in the supine position under the operating microscope.  The right eye was identified as the operative eye and it was prepped and draped in the usual sterile ophthalmic fashion.   A 1 millimeter clear-corneal paracentesis was made at the 12:00 position.  0.5 ml of preservative-free 1% lidocaine  was injected into the anterior chamber. The anterior chamber was filled with Viscoat viscoelastic.  A 2.4 millimeter keratome was used to make a near-clear corneal incision at the 9:00 position.  A curvilinear capsulorrhexis was made with a cystotome and capsulorrhexis forceps.  Balanced salt solution was used to hydrodissect and hydrodelineate the nucleus.   Phacoemulsification was then used in stop and chop fashion to remove the lens nucleus and epinucleus.  The remaining cortex was then removed using the irrigation and aspiration handpiece. Provisc was then placed into the capsular bag to distend it for lens placement.  A lens was then injected into the capsular bag.  The remaining  viscoelastic was aspirated.   Wounds were hydrated with balanced salt solution.  The anterior chamber was inflated to a physiologic pressure with balanced salt solution.  No wound leaks were noted. Cefuroxime 0.1 ml of a 10mg /ml solution was injected into the anterior chamber for a dose of 1 mg of intracameral antibiotic at the completion of the case.   Timolol and Brimonidine drops were applied to the eye.  The patient was taken to the recovery room in stable condition without complications of anesthesia or surgery.   Gina Wells 12/26/2023, 9:26 AM

## 2023-12-26 NOTE — Transfer of Care (Signed)
 Immediate Anesthesia Transfer of Care Note  Patient: Gina Wells  Procedure(s) Performed: PHACOEMULSIFICATION, CATARACT, WITH IOL INSERTION (Right)  Patient Location: PACU  Anesthesia Type: MAC  Level of Consciousness: awake, alert  and patient cooperative  Airway and Oxygen  Therapy: Patient Spontanous Breathing and Patient connected to supplemental oxygen   Post-op Assessment: Post-op Vital signs reviewed, Patient's Cardiovascular Status Stable, Respiratory Function Stable, Patent Airway and No signs of Nausea or vomiting  Post-op Vital Signs: Reviewed and stable  Complications: No notable events documented.

## 2023-12-27 ENCOUNTER — Encounter: Payer: Self-pay | Admitting: Ophthalmology

## 2023-12-27 DIAGNOSIS — H2511 Age-related nuclear cataract, right eye: Secondary | ICD-10-CM | POA: Diagnosis not present

## 2024-01-07 NOTE — Discharge Instructions (Signed)

## 2024-01-09 ENCOUNTER — Encounter: Payer: Self-pay | Admitting: Ophthalmology

## 2024-01-09 ENCOUNTER — Other Ambulatory Visit: Payer: Self-pay

## 2024-01-09 ENCOUNTER — Ambulatory Visit: Payer: Self-pay | Admitting: Anesthesiology

## 2024-01-09 ENCOUNTER — Ambulatory Visit
Admission: RE | Admit: 2024-01-09 | Discharge: 2024-01-09 | Disposition: A | Discharge status: Home / Self Care | Attending: Ophthalmology | Admitting: Ophthalmology

## 2024-01-09 ENCOUNTER — Encounter
Admission: RE | Disposition: A | Discharge status: Home / Self Care | Payer: Self-pay | Source: Home / Self Care | Attending: Ophthalmology

## 2024-01-09 DIAGNOSIS — E0590 Thyrotoxicosis, unspecified without thyrotoxic crisis or storm: Secondary | ICD-10-CM | POA: Insufficient documentation

## 2024-01-09 DIAGNOSIS — I10 Essential (primary) hypertension: Secondary | ICD-10-CM | POA: Insufficient documentation

## 2024-01-09 DIAGNOSIS — Z79899 Other long term (current) drug therapy: Secondary | ICD-10-CM | POA: Insufficient documentation

## 2024-01-09 DIAGNOSIS — Z79890 Hormone replacement therapy (postmenopausal): Secondary | ICD-10-CM | POA: Insufficient documentation

## 2024-01-09 DIAGNOSIS — J449 Chronic obstructive pulmonary disease, unspecified: Secondary | ICD-10-CM | POA: Insufficient documentation

## 2024-01-09 DIAGNOSIS — R519 Headache, unspecified: Secondary | ICD-10-CM | POA: Diagnosis not present

## 2024-01-09 DIAGNOSIS — F17210 Nicotine dependence, cigarettes, uncomplicated: Secondary | ICD-10-CM | POA: Diagnosis not present

## 2024-01-09 DIAGNOSIS — F419 Anxiety disorder, unspecified: Secondary | ICD-10-CM | POA: Diagnosis not present

## 2024-01-09 DIAGNOSIS — K219 Gastro-esophageal reflux disease without esophagitis: Secondary | ICD-10-CM | POA: Diagnosis not present

## 2024-01-09 DIAGNOSIS — Z7982 Long term (current) use of aspirin: Secondary | ICD-10-CM | POA: Insufficient documentation

## 2024-01-09 DIAGNOSIS — F32A Depression, unspecified: Secondary | ICD-10-CM | POA: Diagnosis not present

## 2024-01-09 DIAGNOSIS — H2512 Age-related nuclear cataract, left eye: Secondary | ICD-10-CM | POA: Diagnosis present

## 2024-01-09 HISTORY — PX: CATARACT EXTRACTION W/PHACO: SHX586

## 2024-01-09 SURGERY — PHACOEMULSIFICATION, CATARACT, WITH IOL INSERTION
Anesthesia: Monitor Anesthesia Care | Site: Eye | Laterality: Left

## 2024-01-09 MED ORDER — TETRACAINE HCL 0.5 % OP SOLN
OPHTHALMIC | Status: AC
Start: 2024-01-09 — End: 2024-01-09
  Filled 2024-01-09: qty 4

## 2024-01-09 MED ORDER — ARMC OPHTHALMIC DILATING DROPS
OPHTHALMIC | Status: AC
  Filled 2024-01-09: qty 0.5

## 2024-01-09 MED ORDER — FENTANYL CITRATE (PF) 100 MCG/2ML IJ SOLN
INTRAMUSCULAR | Status: AC
Start: 2024-01-09 — End: 2024-01-09
  Filled 2024-01-09: qty 2

## 2024-01-09 MED ORDER — EPINEPHRINE PF 1 MG/ML IJ SOLN
INTRAMUSCULAR | Status: DC | PRN
  Administered 2024-01-09: 69 mL via OPHTHALMIC

## 2024-01-09 MED ORDER — MIDAZOLAM HCL 2 MG/2ML IJ SOLN
INTRAMUSCULAR | Status: DC | PRN
  Administered 2024-01-09: 2 mg via INTRAVENOUS

## 2024-01-09 MED ORDER — SIGHTPATH DOSE#1 NA HYALUR & NA CHOND-NA HYALUR IO KIT
PACK | INTRAOCULAR | Status: DC | PRN
  Administered 2024-01-09: 1 via OPHTHALMIC

## 2024-01-09 MED ORDER — CEFUROXIME OPHTHALMIC INJECTION 1 MG/0.1 ML
INJECTION | OPHTHALMIC | Status: DC | PRN
Start: 2024-01-09 — End: 2024-01-09
  Administered 2024-01-09: .1 mL via INTRACAMERAL

## 2024-01-09 MED ORDER — BRIMONIDINE TARTRATE-TIMOLOL 0.2-0.5 % OP SOLN
OPHTHALMIC | Status: DC | PRN
  Administered 2024-01-09: 1 [drp] via OPHTHALMIC

## 2024-01-09 MED ORDER — ARMC OPHTHALMIC DILATING DROPS
1.0000 | OPHTHALMIC | Status: DC | PRN
  Administered 2024-01-09 (×3): 1 via OPHTHALMIC

## 2024-01-09 MED ORDER — DEXMEDETOMIDINE HCL IN NACL 80 MCG/20ML IV SOLN
INTRAVENOUS | Status: DC | PRN
  Administered 2024-01-09: 4 ug via INTRAVENOUS

## 2024-01-09 MED ORDER — SIGHTPATH DOSE#1 BSS IO SOLN
INTRAOCULAR | Status: DC | PRN
  Administered 2024-01-09: 15 mL via INTRAOCULAR

## 2024-01-09 MED ORDER — MIDAZOLAM HCL 2 MG/2ML IJ SOLN
INTRAMUSCULAR | Status: AC
  Filled 2024-01-09: qty 2

## 2024-01-09 MED ORDER — FENTANYL CITRATE (PF) 100 MCG/2ML IJ SOLN
INTRAMUSCULAR | Status: DC | PRN
  Administered 2024-01-09: 50 ug via INTRAVENOUS

## 2024-01-09 MED ORDER — TETRACAINE HCL 0.5 % OP SOLN
1.0000 [drp] | OPHTHALMIC | Status: DC | PRN
  Administered 2024-01-09 (×3): 1 [drp] via OPHTHALMIC

## 2024-01-09 MED ORDER — LIDOCAINE HCL (PF) 2 % IJ SOLN
INTRAOCULAR | Status: DC | PRN
  Administered 2024-01-09: 2 mL

## 2024-01-09 SURGICAL SUPPLY — 10 items
CATARACT SUITE SIGHTPATH (MISCELLANEOUS) ×1 IMPLANT
FEE CATARACT SUITE SIGHTPATH (MISCELLANEOUS) ×2 IMPLANT
GLOVE BIOGEL PI IND STRL 8 (GLOVE) ×2 IMPLANT
GLOVE SURG LX STRL 7.5 STRW (GLOVE) ×2 IMPLANT
GLOVE SURG PROTEXIS BL SZ6.5 (GLOVE) ×1 IMPLANT
GLOVE SURG SYN 6.5 PF PI BL (GLOVE) ×2 IMPLANT
LENS IOL TECNIS EYHANCE 8.0 (Intraocular Lens) IMPLANT
NDL FILTER BLUNT 18X1 1/2 (NEEDLE) ×2 IMPLANT
NEEDLE FILTER BLUNT 18X1 1/2 (NEEDLE) ×1 IMPLANT
SYR 3ML LL SCALE MARK (SYRINGE) ×2 IMPLANT

## 2024-01-09 NOTE — Anesthesia Preprocedure Evaluation (Addendum)
 Anesthesia Evaluation  Patient identified by MRN, date of birth, ID band Patient awake    Reviewed: Allergy & Precautions, H&P , NPO status , Patient's Chart, lab work & pertinent test results  Airway Mallampati: III  TM Distance: >3 FB Neck ROM: Full  Mouth opening: Limited Mouth Opening  Dental no notable dental hx.    Pulmonary COPD, Current Smoker and Patient abstained from smoking. Smokes 1/2 PPD    Pulmonary exam normal breath sounds clear to auscultation       Cardiovascular hypertension, Normal cardiovascular exam Rhythm:Regular Rate:Normal  01-01-19 echoINTERPRETATION  NORMAL LEFT VENTRICULAR SYSTOLIC FUNCTION  NORMAL RIGHT VENTRICULAR SYSTOLIC FUNCTION  TRIVIAL REGURGITATION NOTED  GRADE I DIASTOLIC DYSFUNCTION NO VALVULAR STENOSIS  Normal right heart pressures      Neuro/Psych  Headaches PSYCHIATRIC DISORDERS Anxiety Depression    negative neurological ROS  negative psych ROS   GI/Hepatic Neg liver ROS,GERD  ,,  Endo/Other  Hypothyroidism Hyperthyroidism   Renal/GU negative Renal ROS  negative genitourinary   Musculoskeletal negative musculoskeletal ROS (+)    Abdominal   Peds negative pediatric ROS (+)  Hematology negative hematology ROS (+)   Anesthesia Other Findings Hx vitrectomy   Class I obesity, BMI 34.33 Hx of migraine headaches  Alcoholism--patient denies ETOH  Hyperthyroidism  Toxic multinodular goiter w/o crisis Depression Anxiety  Fatigue Anxiety  Hypertension COPD (chronic obstructive pulmonary disease) (HCC)  GERD (gastroesophageal reflux disease) History of Helicobacter pylori infection  Hx of gastric ulcer Substance abuse (HCC) ] Alcohol abuse History of kidney stones     Reproductive/Obstetrics negative OB ROS                             Anesthesia Physical Anesthesia Plan  ASA: 3  Anesthesia Plan: MAC   Post-op Pain Management:     Induction: Intravenous  PONV Risk Score and Plan:   Airway Management Planned: Natural Airway and Nasal Cannula  Additional Equipment:   Intra-op Plan:   Post-operative Plan:   Informed Consent: I have reviewed the patients History and Physical, chart, labs and discussed the procedure including the risks, benefits and alternatives for the proposed anesthesia with the patient or authorized representative who has indicated his/her understanding and acceptance.       Plan Discussed with: Anesthesiologist, CRNA and Surgeon  Anesthesia Plan Comments:         Anesthesia Quick Evaluation

## 2024-01-09 NOTE — Transfer of Care (Signed)
 Immediate Anesthesia Transfer of Care Note  Patient: Gina Wells  Procedure(s) Performed: PHACOEMULSIFICATION, CATARACT, WITH IOL INSERTION 2.57 00:30.8 (Left: Eye)  Patient Location: PACU  Anesthesia Type: MAC  Level of Consciousness: awake, alert  and patient cooperative  Airway and Oxygen  Therapy: Patient Spontanous Breathing and Patient connected to supplemental oxygen   Post-op Assessment: Post-op Vital signs reviewed, Patient's Cardiovascular Status Stable, Respiratory Function Stable, Patent Airway and No signs of Nausea or vomiting  Post-op Vital Signs: Reviewed and stable  Complications: No notable events documented.

## 2024-01-09 NOTE — Anesthesia Postprocedure Evaluation (Signed)
 Anesthesia Post Note  Patient: Gina Wells  Procedure(s) Performed: PHACOEMULSIFICATION, CATARACT, WITH IOL INSERTION 2.57 00:30.8 (Left: Eye)  Patient location during evaluation: PACU Anesthesia Type: MAC Level of consciousness: awake and alert Pain management: pain level controlled Vital Signs Assessment: post-procedure vital signs reviewed and stable Respiratory status: spontaneous breathing, nonlabored ventilation and respiratory function stable Cardiovascular status: stable and blood pressure returned to baseline Postop Assessment: no apparent nausea or vomiting Anesthetic complications: no   No notable events documented.   Last Vitals:  Vitals:   01/09/24 0934 01/09/24 0935  BP: 131/77   Pulse:  68  Resp: 18 16  Temp:    SpO2:  96%    Last Pain:  Vitals:   01/09/24 0935  TempSrc:   PainSc: 0-No pain                 Baltazar Bonier

## 2024-01-09 NOTE — Op Note (Signed)
 OPERATIVE NOTE  Gina Wells [id number] 01/09/2024   PREOPERATIVE DIAGNOSIS:  Nuclear sclerotic cataract left eye. H25.12   POSTOPERATIVE DIAGNOSIS:    Nuclear sclerotic cataract left eye.     PROCEDURE:  Phacoemusification with posterior chamber intraocular lens placement of the left eye  Ultrasound time: Procedure(s): PHACOEMULSIFICATION, CATARACT, WITH IOL INSERTION 2.57 00:30.8 (Left)  LENS:   Implant Name Type Inv. Item Serial No. Manufacturer Lot No. LRB No. Used Action  LENS IOL TECNIS EYHANCE 8.0 - [id number] Intraocular Lens LENS IOL TECNIS EYHANCE 8.0 [id number] [employer]  Left 1 Implanted      SURGEON:  Berline Brenner, MD   ANESTHESIA:  Topical with tetracaine  drops and 2% Xylocaine  jelly, augmented with 1% preservative-free intracameral lidocaine .    COMPLICATIONS:  None.   DESCRIPTION OF PROCEDURE:  The patient was identified in the holding room and transported to the operating room and placed in the supine position under the operating microscope.  The left eye was identified as the operative eye and it was prepped and draped in the usual sterile ophthalmic fashion.   A 1 millimeter clear-corneal paracentesis was made at the 1:30 position.  0.5 ml of preservative-free 1% lidocaine  was injected into the anterior chamber.  The anterior chamber was filled with Viscoat viscoelastic.  A 2.4 millimeter keratome was used to make a near-clear corneal incision at the 10:30 position.  .  A curvilinear capsulorrhexis was made with a cystotome and capsulorrhexis forceps.  Balanced salt  solution was used to hydrodissect and hydrodelineate the nucleus.   Phacoemulsification was then used in stop and chop fashion to remove the lens nucleus and epinucleus.  The remaining cortex was then removed using the irrigation and aspiration handpiece. Provisc was then placed into the capsular bag to distend it for lens placement.  A lens was then injected into the capsular bag.  The  remaining viscoelastic was aspirated.   Wounds were hydrated with balanced salt  solution.  The anterior chamber was inflated to a physiologic pressure with balanced salt  solution.  No wound leaks were noted. Cefuroxime  0.1 ml of a 10mg /ml solution was injected into the anterior chamber for a dose of 1 mg of intracameral antibiotic at the completion of the case.   Timolol  and Brimonidine  drops were applied to the eye.  The patient was taken to the recovery room in stable condition without complications of anesthesia or surgery.  [name] 01/09/2024, 9:27 AM

## 2024-01-09 NOTE — H&P (Signed)
 [facility]   Primary Care Physician:  Carollynn Cirri, NP Ophthalmologist: Dr. Annell Kidney  Pre-Procedure History & Physical: HPI:  Gina Wells is a 63 y.o. female here for ophthalmic surgery.   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     Past Surgical History:  Procedure Laterality Date   BIOPSY  04/11/2023   Procedure: BIOPSY;  Surgeon: Luke Salaam, MD;  Location: [facility] ENDOSCOPY;  Service: Gastroenterology;;   CATARACT EXTRACTION W/PHACO Right 12/26/2023   Procedure: PHACOEMULSIFICATION, CATARACT, WITH IOL INSERTION 4.87 00:45.4;  Surgeon: Annell Kidney, MD;  Location: [facility];  Service: Ophthalmology;  Laterality: Right;   COLONOSCOPY WITH PROPOFOL  N/A 04/11/2023   Procedure: COLONOSCOPY WITH PROPOFOL ;  Surgeon: Luke Salaam, MD;  Location: [facility] ENDOSCOPY;  Service: Gastroenterology;  Laterality: N/A;   ESOPHAGOGASTRODUODENOSCOPY (EGD) WITH PROPOFOL  N/A 04/11/2023   Procedure: ESOPHAGOGASTRODUODENOSCOPY (EGD) WITH PROPOFOL ;  Surgeon: Luke Salaam, MD;  Location: [facility] ENDOSCOPY;  Service: Gastroenterology;  Laterality: N/A;   HEMOSTASIS CLIP PLACEMENT  04/11/2023   Procedure: HEMOSTASIS CLIP PLACEMENT;  Surgeon: Luke Salaam, MD;  Location: [facility] ENDOSCOPY;  Service: Gastroenterology;;   KIDNEY STONE SURGERY     POLYPECTOMY  04/11/2023   Procedure: POLYPECTOMY;  Surgeon: Luke Salaam, MD;  Location: [facility] ENDOSCOPY;  Service: Gastroenterology;;   URETERAL STENT PLACEMENT     2002 for kidney  stones   VAGINAL HYSTERECTOMY      Prior to Admission medications   Medication Sig Start Date End Date Taking? Authorizing Provider  albuterol  (VENTOLIN  HFA) 108 (90 Base) MCG/ACT inhaler Inhale 2 puffs into the lungs every 6 (six) hours as needed for wheezing or shortness of breath. 11/14/23  Yes Baity, Rankin Buzzard, NP  amLODipine -olmesartan  (AZOR ) 10-40 MG tablet Take 1 tablet by mouth daily. 11/14/23  Yes Baity, Rankin Buzzard, NP  Aspirin -Salicylamide-Caffeine (BC HEADACHE POWDER PO) Take by mouth 3 (three) times daily as needed.   Yes [provider]  bismuth  subsalicylate (PEPTO BISMOL) 262 MG chewable tablet Chew 2 tablets 4 times a day for 14 days 04/13/23  Yes Luke Salaam, MD  budeson-glycopyrrolate-formoterol  (BREZTRI  AEROSPHERE) 160-9-4.8 MCG/ACT AERO inhaler Inhale 2 puffs into the lungs 2 (two) times daily. 11/14/23  Yes Carollynn Cirri, NP  labetalol  (NORMODYNE ) 100 MG tablet Take 1 tablet by mouth twice daily 11/16/23  Yes Baity, Rankin Buzzard, NP  linaclotide  (LINZESS ) 72 MCG capsule Take 1 capsule (72 mcg total) by mouth daily as needed. 01/15/23  Yes Baity, Rankin Buzzard, NP  omeprazole  (PRILOSEC) 20 MG capsule Take 1 capsule (20 mg total) by mouth 2 (two) times daily as needed. 11/14/23 05/12/24 Yes Carollynn Cirri, NP  SYNTHROID  200 MCG tablet Take 1 tablet (200 mcg total) by mouth daily before breakfast. 11/15/23  Yes Carollynn Cirri, NP  SYNTHROID  25 MCG tablet Take 1 tablet (25 mcg total) by mouth daily before breakfast. 11/15/23  Yes  Carollynn Cirri, NP  aspirin  EC 81 MG tablet Take 1 tablet (81 mg total) by mouth daily. Swallow whole. Patient not taking: Reported on 12/19/2023 11/14/23   Carollynn Cirri, NP  atorvastatin  (LIPITOR) 10 MG tablet Take 1 tablet (10 mg total) by mouth daily. Patient not taking: Reported on 12/19/2023 11/15/23   Carollynn Cirri, NP  nortriptyline  (PAMELOR ) 10 MG capsule Take 1 capsule (10 mg total) by mouth at bedtime. Patient taking differently: Take 10 mg by mouth  at bedtime as needed. 11/14/23   Carollynn Cirri, NP  omeprazole  (PRILOSEC) 20 MG capsule Take 1 capsule (20 mg total) by mouth 2 (two) times daily before a meal. Patient taking differently: Take 20 mg by mouth as needed. 02/16/20   Malfi, Judee Norway, FNP    Allergies as of 12/11/2023   (No Known Allergies)    Family History  Problem Relation Age of Onset   Diabetes Mother    Alcohol abuse Father    Diabetes Father    Cancer Father    Cancer Paternal Aunt    Alcohol abuse Brother     Social History   Socioeconomic History   Marital status: Single    Spouse name: Not on file   Number of children: Not on file   Years of education: Not on file   Highest education level: GED or equivalent  Occupational History   Not on file  Tobacco Use   Smoking status: Every Day    Current packs/day: 0.25    Average packs/day: 0.3 packs/day for 50.2 years (12.5 ttl pk-yrs)    Types: Cigarettes    Start date: 07/25/1975   Smokeless tobacco: Never  Vaping Use   Vaping status: Never Used  Substance and Sexual Activity   Alcohol use: No    Alcohol/week: 0.0 standard drinks of alcohol   Drug use: No   Sexual activity: Yes  Other Topics Concern   Not on file  Social History Narrative   Not on file   Social Drivers of Health   Financial Resource Strain: Medium Risk (04/18/2023)   Overall Financial Resource Strain (CARDIA)    Difficulty of Paying Living Expenses: Somewhat hard  Food Insecurity: No Food Insecurity (04/18/2023)   Hunger Vital Sign    Worried About Running Out of Food in the Last Year: Never true    Ran Out of Food in the Last Year: Never true  Transportation Needs: No Transportation Needs (04/18/2023)   PRAPARE - Administrator, Civil Service (Medical): No    Lack of Transportation (Non-Medical): No  Physical Activity: Sufficiently Active (04/18/2023)   Exercise Vital Sign    Days of Exercise per Week: 4 days    Minutes of Exercise per Session: 40 min  Stress: No  Stress Concern Present (04/18/2023)   [employer] of Occupational Health - Occupational Stress Questionnaire    Feeling of Stress : Only a little  Social Connections: Moderately Integrated (04/18/2023)   Social Connection and Isolation Panel    Frequency of Communication with Friends and Family: More than three times a week    Frequency of Social Gatherings with Friends and Family: More than three times a week    Attends Religious Services: More than 4 times per year    Active Member of [employer] or Organizations: Yes    Attends Engineer, structural: More than 4 times per year    Marital Status: Divorced  Catering manager Violence: Not on file  Review of Systems: See HPI, otherwise negative ROS  Physical Exam: BP (!) 148/86   Temp 97.7 F (36.5 C) (Temporal)   Resp 20   Ht 5' 4.02 (1.626 m)   Wt 90.1 kg   SpO2 96%   BMI 34.09 kg/m  General:   Alert,  pleasant and cooperative in NAD Head:  Normocephalic and atraumatic. Lungs:  Clear to auscultation.    Heart:  Regular rate and rhythm.   Impression/Plan: Gina Wells is here for ophthalmic surgery.  Risks, benefits, limitations, and alternatives regarding ophthalmic surgery have been reviewed with the patient.  Questions have been answered.  All parties agreeable.   Annell Kidney, MD  01/09/2024, 8:30 AM

## 2024-01-10 ENCOUNTER — Encounter: Payer: Self-pay | Admitting: Ophthalmology

## 2024-02-13 ENCOUNTER — Other Ambulatory Visit

## 2024-02-13 DIAGNOSIS — E890 Postprocedural hypothyroidism: Secondary | ICD-10-CM

## 2024-02-13 DIAGNOSIS — E7800 Pure hypercholesterolemia, unspecified: Secondary | ICD-10-CM

## 2024-02-14 ENCOUNTER — Ambulatory Visit: Payer: Self-pay | Admitting: Internal Medicine

## 2024-02-14 LAB — COMPREHENSIVE METABOLIC PANEL WITH GFR
AG Ratio: 1.6 (calc) (ref 1.0–2.5)
ALT: 11 U/L (ref 6–29)
AST: 10 U/L (ref 10–35)
Albumin: 4.2 g/dL (ref 3.6–5.1)
Alkaline phosphatase (APISO): 120 U/L (ref 37–153)
BUN: 23 mg/dL (ref 7–25)
CO2: 28 mmol/L (ref 20–32)
Calcium: 10.2 mg/dL (ref 8.6–10.4)
Chloride: 106 mmol/L (ref 98–110)
Creat: 1.05 mg/dL (ref 0.50–1.05)
Globulin: 2.7 g/dL (ref 1.9–3.7)
Glucose, Bld: 114 mg/dL — ABNORMAL HIGH (ref 65–99)
Potassium: 3.9 mmol/L (ref 3.5–5.3)
Sodium: 143 mmol/L (ref 135–146)
Total Bilirubin: 0.5 mg/dL (ref 0.2–1.2)
Total Protein: 6.9 g/dL (ref 6.1–8.1)
eGFR: 60 mL/min/1.73m2 (ref >=60)

## 2024-02-14 LAB — LIPID PANEL
Cholesterol: 139 mg/dL (ref <200)
HDL: 54 mg/dL (ref >=50)
LDL Cholesterol (Calc): 68 mg/dL
Non-HDL Cholesterol (Calc): 85 mg/dL (ref <130)
Total CHOL/HDL Ratio: 2.6 (calc) (ref <5.0)
Triglycerides: 86 mg/dL (ref <150)

## 2024-02-14 LAB — TSH: TSH: 0.73 m[IU]/L (ref 0.40–4.50)

## 2024-05-14 ENCOUNTER — Encounter: Admitting: Internal Medicine

## 2024-07-25 ENCOUNTER — Other Ambulatory Visit: Payer: Self-pay | Admitting: Internal Medicine

## 2024-07-25 NOTE — Telephone Encounter (Signed)
 Requested Prescriptions  Pending Prescriptions Disp Refills   SYNTHROID  200 MCG tablet [Pharmacy Med Name: Synthroid  200 MCG Oral Tablet] 90 tablet 1    Sig: TAKE 1 TABLET BY MOUTH ONCE DAILY BEFORE BREAKFAST     Endocrinology:  Hypothyroid Agents Passed - 07/25/2024  5:43 PM      Passed - TSH in normal range and within 360 days    TSH  Date Value Ref Range Status  02/13/2024 0.73 0.40 - 4.50 mIU/L Final         Passed - Valid encounter within last 12 months    Recent Outpatient Visits           8 months ago Pure hypercholesterolemia   [place] [facility] [place], Angeline ORN, [place]

## 2024-07-28 ENCOUNTER — Telehealth: Payer: Self-pay

## 2024-07-28 NOTE — Telephone Encounter (Signed)
 Copied from CRM #[id number]. Topic: Clinical - Medication Prior Auth >> Jul 28, 2024  1:19 PM Gina Wells wrote: Reason for CRM: Patient informed by pharmacy prior authorization is needed for SYNTHROID  200 MCG tablet. Patient has switched to [employer]. Patient only has a week left of medication. Requesting prior authorization be completed.

## 2024-07-29 ENCOUNTER — Other Ambulatory Visit (HOSPITAL_COMMUNITY): Payer: Self-pay

## 2024-07-29 ENCOUNTER — Telehealth: Payer: Self-pay

## 2024-07-29 NOTE — Telephone Encounter (Signed)
 Pharmacy Patient Advocate Encounter   Received notification from Pt Calls Messages that prior authorization for Synthroid  200 mcg is required/requested.  Unable to verify insurance. The patient is insured through [facility] MEDICAID.

## 2024-07-31 ENCOUNTER — Other Ambulatory Visit (HOSPITAL_COMMUNITY): Payer: Self-pay

## 2024-08-04 ENCOUNTER — Telehealth: Payer: Self-pay

## 2024-08-04 NOTE — Telephone Encounter (Signed)
 Copied from CRM #[id number]. Topic: Clinical - Prescription Issue >> Aug 04, 2024 12:31 PM Lonell PEDLAR wrote: Reason for CRM: Patient calling for an update regarding PA for SYNTHROID  200 MCG tablet, please cann [phone] to best advise.

## 2024-08-05 ENCOUNTER — Telehealth: Payer: Self-pay

## 2024-08-05 NOTE — Telephone Encounter (Signed)
 Copied from CRM #[id number]. Topic: General - Other >> Aug 05, 2024 10:23 AM Pinkey ORN wrote: Reason for CRM: Returning Missed Office Call >> Aug 05, 2024 10:24 AM Pinkey ORN wrote: Patient returning missed office call from Larinda Alan HERO, CMA. Patient states the only insurance she has is medicaid.

## 2024-08-05 NOTE — Telephone Encounter (Signed)
 See previous message

## 2024-08-05 NOTE — Telephone Encounter (Signed)
 Left message for patient to return call OK  to advise  please notify patient to upload current insurance information into [facility].

## 2024-08-05 NOTE — Telephone Encounter (Signed)
 Insurance verified with patient.   Medicaid  Member # [id number]  [phone] prescription number

## 2024-08-07 ENCOUNTER — Telehealth: Payer: Self-pay

## 2024-08-07 ENCOUNTER — Other Ambulatory Visit (HOSPITAL_COMMUNITY): Payer: Self-pay

## 2024-08-07 NOTE — Telephone Encounter (Signed)
 Copied from CRM [id number]. Topic: Clinical - Medication Prior Auth >> Aug 07, 2024 11:52 AM Gina Wells wrote: Reason for CRM: patient stated the medication is at the pharmacy and would need a PA for SYNTHROID  200 MCG.

## 2024-08-07 NOTE — Telephone Encounter (Signed)
 Prior Authorization form/request asks a question that requires your assistance. Please see the question below and advise accordingly. The PA will not be submitted until the necessary information is received.  Has the patient failed two preferred drugs (if only one preferred drug is available, then failed one preferred drug)?   List preferred drug(s) failed:   Please select reason for failure to preferred drug(s):

## 2024-08-08 NOTE — Telephone Encounter (Signed)
 I have reached out to the patient.  I know that she has been on Euthyrox  and levothyroxine  in the past however both of these medications would not keep her TSH in range.  Do you have a list of the preferred drugs?

## 2024-08-11 ENCOUNTER — Other Ambulatory Visit (HOSPITAL_COMMUNITY): Payer: Self-pay

## 2024-08-11 ENCOUNTER — Telehealth: Payer: Self-pay

## 2024-08-11 NOTE — Telephone Encounter (Signed)
 PA update.

## 2024-08-15 ENCOUNTER — Telehealth: Payer: Self-pay

## 2024-08-15 NOTE — Telephone Encounter (Signed)
 Copied from CRM [id number]. Topic: Clinical - Medication Prior Auth >> Aug 07, 2024 11:52 AM Avram MATSU wrote: Reason for CRM: patient stated the medication is at the pharmacy and would need a PA for SYNTHROID  200 MCG.

## 2024-08-19 ENCOUNTER — Other Ambulatory Visit (HOSPITAL_COMMUNITY): Payer: Self-pay

## 2024-08-19 ENCOUNTER — Telehealth: Payer: Self-pay

## 2024-08-19 NOTE — Telephone Encounter (Signed)
 Reference #:[id number]  I spoke with Shona @ [facility] and patient's [place] medicaid plan is Family planning only. They will only cover birth control and specific antibiotics. They will not cover this medication. If patient needs to make changes to plan she needs to contact social worker or social services.

## 2024-08-19 NOTE — Telephone Encounter (Signed)
 LMTCB. Okay to advise below if patient calls back.

## 2024-08-20 ENCOUNTER — Telehealth: Payer: Self-pay

## 2024-08-20 ENCOUNTER — Other Ambulatory Visit (HOSPITAL_COMMUNITY): Payer: Self-pay

## 2024-08-20 NOTE — Telephone Encounter (Signed)
 Copied from CRM #[id number]. Topic: General - Other >> Aug 20, 2024  9:49 AM Travis F wrote: Reason for CRM: Patient is calling in returning a call from the office. Patient says she reached out regarding her Prior Authorization and believes this is what the call is about, but patient isn't sure. Please follow up with patient.

## 2024-08-20 NOTE — Telephone Encounter (Signed)
 Copied from CRM #[id number]. Topic: General - Other >> Aug 20, 2024  9:49 AM Travis F wrote: Reason for CRM: Patient is calling in returning a call from the office. Patient says she reached out regarding her Prior Authorization and believes this is what the call is about, but patient isn't sure. Please follow up with patient. >> Aug 20, 2024  2:44 PM Zebedee SAUNDERS wrote:  Pt returning Larinda Alan HERO, CMA call regarding prior authorization for SYNTHROID  200 MCG. Please call pt at [phone]

## 2024-08-20 NOTE — Telephone Encounter (Signed)
 Spoke with patient, advised her to reach out to her child psychotherapist. Advised her of the previous message that her type of medicaid she has only covers birth control and some antibiotics.   She will contact her social worker and try to get different insurance.
